# Patient Record
Sex: Male | Born: 1935
Health system: Southern US, Community
[De-identification: ages and names within clinical notes are randomized; demographics above are authoritative.]

## PROBLEM LIST (undated history)

## (undated) DIAGNOSIS — E785 Hyperlipidemia, unspecified: Secondary | ICD-10-CM

## (undated) DIAGNOSIS — R0600 Dyspnea, unspecified: Secondary | ICD-10-CM

## (undated) DIAGNOSIS — M8580 Other specified disorders of bone density and structure, unspecified site: Secondary | ICD-10-CM

## (undated) DIAGNOSIS — Z7901 Long term (current) use of anticoagulants: Secondary | ICD-10-CM

## (undated) DIAGNOSIS — K2270 Barrett's esophagus without dysplasia: Secondary | ICD-10-CM

## (undated) DIAGNOSIS — I351 Nonrheumatic aortic (valve) insufficiency: Secondary | ICD-10-CM

## (undated) DIAGNOSIS — G4730 Sleep apnea, unspecified: Secondary | ICD-10-CM

## (undated) DIAGNOSIS — K635 Polyp of colon: Secondary | ICD-10-CM

## (undated) DIAGNOSIS — S060X9A Concussion with loss of consciousness of unspecified duration, initial encounter: Secondary | ICD-10-CM

## (undated) DIAGNOSIS — R319 Hematuria, unspecified: Secondary | ICD-10-CM

## (undated) DIAGNOSIS — K449 Diaphragmatic hernia without obstruction or gangrene: Secondary | ICD-10-CM

## (undated) DIAGNOSIS — R42 Dizziness and giddiness: Secondary | ICD-10-CM

## (undated) DIAGNOSIS — R001 Bradycardia, unspecified: Secondary | ICD-10-CM

## (undated) DIAGNOSIS — I10 Essential (primary) hypertension: Secondary | ICD-10-CM

## (undated) DIAGNOSIS — S060XAA Concussion with loss of consciousness status unknown, initial encounter: Secondary | ICD-10-CM

## (undated) DIAGNOSIS — I4891 Unspecified atrial fibrillation: Secondary | ICD-10-CM

## (undated) DIAGNOSIS — K219 Gastro-esophageal reflux disease without esophagitis: Secondary | ICD-10-CM

## (undated) HISTORY — PX: OTHER SURGICAL HISTORY: SHX169

## (undated) HISTORY — DX: Sleep apnea, unspecified: G47.30

## (undated) HISTORY — PX: BLADDER SURGERY: SHX569

## (undated) HISTORY — DX: Unspecified atrial fibrillation: I48.91

## (undated) HISTORY — DX: Diaphragmatic hernia without obstruction or gangrene: K44.9

## (undated) HISTORY — PX: PROSTATE SURGERY: SHX751

## (undated) HISTORY — PX: CARPAL TUNNEL RELEASE: SHX101

## (undated) HISTORY — DX: Hyperlipidemia, unspecified: E78.5

## (undated) HISTORY — DX: Barrett's esophagus without dysplasia: K22.70

## (undated) HISTORY — DX: Nonrheumatic aortic (valve) insufficiency: I35.1

## (undated) HISTORY — PX: SHOULDER ARTHROSCOPY: SHX128

## (undated) HISTORY — PX: TONSILLECTOMY: SUR1361

## (undated) HISTORY — DX: Polyp of colon: K63.5

## (undated) HISTORY — DX: Essential (primary) hypertension: I10

## (undated) HISTORY — DX: Gastro-esophageal reflux disease without esophagitis: K21.9

## (undated) HISTORY — PX: EYE SURGERY: SHX253

## (undated) HISTORY — DX: Bradycardia, unspecified: R00.1

## (undated) HISTORY — DX: Dyspnea, unspecified: R06.00

## (undated) HISTORY — DX: Other specified disorders of bone density and structure, unspecified site: M85.80

## (undated) HISTORY — DX: Long term (current) use of anticoagulants: Z79.01

## (undated) HISTORY — PX: COLON SURGERY: SHX602

## (undated) HISTORY — DX: Dizziness and giddiness: R42

---

## 1997-06-17 HISTORY — PX: EYE SURGERY: SHX253

## 1997-11-11 ENCOUNTER — Observation Stay (HOSPITAL_COMMUNITY): Admission: RE | Admit: 1997-11-11 | Discharge: 1997-11-12 | Payer: Self-pay | Admitting: Urology

## 1998-01-09 ENCOUNTER — Other Ambulatory Visit: Admission: RE | Admit: 1998-01-09 | Discharge: 1998-01-09 | Payer: Self-pay | Admitting: Urology

## 1998-01-12 ENCOUNTER — Observation Stay (HOSPITAL_COMMUNITY): Admission: RE | Admit: 1998-01-12 | Discharge: 1998-01-13 | Payer: Self-pay | Admitting: Urology

## 1999-12-09 ENCOUNTER — Inpatient Hospital Stay (HOSPITAL_COMMUNITY): Admission: RE | Admit: 1999-12-09 | Discharge: 1999-12-13 | Payer: Self-pay | Admitting: Urology

## 1999-12-10 ENCOUNTER — Encounter: Payer: Self-pay | Admitting: Urology

## 1999-12-12 ENCOUNTER — Encounter: Payer: Self-pay | Admitting: Urology

## 2001-09-22 ENCOUNTER — Encounter: Admission: RE | Admit: 2001-09-22 | Discharge: 2001-12-21 | Payer: Self-pay | Admitting: Internal Medicine

## 2002-03-06 ENCOUNTER — Emergency Department (HOSPITAL_COMMUNITY): Admission: EM | Admit: 2002-03-06 | Discharge: 2002-03-06 | Payer: Self-pay | Admitting: Emergency Medicine

## 2002-03-07 ENCOUNTER — Encounter: Payer: Self-pay | Admitting: Emergency Medicine

## 2002-06-17 HISTORY — PX: CARDIOVERSION: SHX1299

## 2002-08-11 ENCOUNTER — Encounter: Payer: Self-pay | Admitting: Urology

## 2002-08-11 ENCOUNTER — Ambulatory Visit (HOSPITAL_COMMUNITY): Admission: RE | Admit: 2002-08-11 | Discharge: 2002-08-11 | Payer: Self-pay | Admitting: Urology

## 2002-08-22 ENCOUNTER — Ambulatory Visit (HOSPITAL_COMMUNITY): Admission: RE | Admit: 2002-08-22 | Discharge: 2002-08-22 | Payer: Self-pay | Admitting: Urology

## 2002-08-22 ENCOUNTER — Encounter: Payer: Self-pay | Admitting: Urology

## 2002-09-15 ENCOUNTER — Encounter: Payer: Self-pay | Admitting: Urology

## 2002-09-20 ENCOUNTER — Encounter (INDEPENDENT_AMBULATORY_CARE_PROVIDER_SITE_OTHER): Payer: Self-pay | Admitting: Specialist

## 2002-09-20 ENCOUNTER — Inpatient Hospital Stay (HOSPITAL_COMMUNITY): Admission: RE | Admit: 2002-09-20 | Discharge: 2002-09-23 | Payer: Self-pay | Admitting: Urology

## 2003-03-31 HISTORY — PX: US ECHOCARDIOGRAPHY: HXRAD669

## 2003-05-16 ENCOUNTER — Ambulatory Visit (HOSPITAL_COMMUNITY): Admission: RE | Admit: 2003-05-16 | Discharge: 2003-05-16 | Payer: Self-pay | Admitting: Cardiology

## 2004-04-24 ENCOUNTER — Inpatient Hospital Stay (HOSPITAL_COMMUNITY): Admission: AD | Admit: 2004-04-24 | Discharge: 2004-04-30 | Payer: Self-pay | Admitting: Cardiology

## 2004-06-04 ENCOUNTER — Ambulatory Visit: Payer: Self-pay | Admitting: Gastroenterology

## 2004-06-17 DIAGNOSIS — K635 Polyp of colon: Secondary | ICD-10-CM

## 2004-06-17 HISTORY — DX: Polyp of colon: K63.5

## 2004-07-03 ENCOUNTER — Ambulatory Visit: Payer: Self-pay | Admitting: Gastroenterology

## 2004-07-31 ENCOUNTER — Encounter (INDEPENDENT_AMBULATORY_CARE_PROVIDER_SITE_OTHER): Payer: Self-pay | Admitting: *Deleted

## 2004-07-31 ENCOUNTER — Ambulatory Visit: Payer: Self-pay | Admitting: Gastroenterology

## 2004-08-01 ENCOUNTER — Encounter (INDEPENDENT_AMBULATORY_CARE_PROVIDER_SITE_OTHER): Payer: Self-pay | Admitting: Specialist

## 2004-08-01 ENCOUNTER — Ambulatory Visit (HOSPITAL_COMMUNITY): Admission: RE | Admit: 2004-08-01 | Discharge: 2004-08-01 | Payer: Self-pay | Admitting: Internal Medicine

## 2004-12-15 ENCOUNTER — Emergency Department (HOSPITAL_COMMUNITY): Admission: EM | Admit: 2004-12-15 | Discharge: 2004-12-15 | Payer: Self-pay | Admitting: Emergency Medicine

## 2006-12-23 ENCOUNTER — Ambulatory Visit (HOSPITAL_COMMUNITY): Admission: RE | Admit: 2006-12-23 | Discharge: 2006-12-23 | Payer: Self-pay | Admitting: Internal Medicine

## 2009-07-07 ENCOUNTER — Encounter (INDEPENDENT_AMBULATORY_CARE_PROVIDER_SITE_OTHER): Payer: Self-pay | Admitting: *Deleted

## 2009-07-19 ENCOUNTER — Ambulatory Visit: Payer: Self-pay | Admitting: Gastroenterology

## 2009-07-19 ENCOUNTER — Encounter (INDEPENDENT_AMBULATORY_CARE_PROVIDER_SITE_OTHER): Payer: Self-pay | Admitting: *Deleted

## 2009-08-10 ENCOUNTER — Ambulatory Visit: Payer: Self-pay | Admitting: Gastroenterology

## 2009-08-10 ENCOUNTER — Encounter (INDEPENDENT_AMBULATORY_CARE_PROVIDER_SITE_OTHER): Payer: Self-pay | Admitting: *Deleted

## 2009-08-10 DIAGNOSIS — E119 Type 2 diabetes mellitus without complications: Secondary | ICD-10-CM | POA: Insufficient documentation

## 2009-08-10 DIAGNOSIS — Z860100 Personal history of colon polyps, unspecified: Secondary | ICD-10-CM | POA: Insufficient documentation

## 2009-08-10 DIAGNOSIS — I10 Essential (primary) hypertension: Secondary | ICD-10-CM | POA: Insufficient documentation

## 2009-08-10 DIAGNOSIS — D139 Benign neoplasm of ill-defined sites within the digestive system: Secondary | ICD-10-CM | POA: Insufficient documentation

## 2009-08-11 ENCOUNTER — Telehealth: Payer: Self-pay | Admitting: Gastroenterology

## 2009-08-21 ENCOUNTER — Telehealth (INDEPENDENT_AMBULATORY_CARE_PROVIDER_SITE_OTHER): Payer: Self-pay | Admitting: *Deleted

## 2009-08-23 ENCOUNTER — Ambulatory Visit: Payer: Self-pay | Admitting: Gastroenterology

## 2009-08-23 DIAGNOSIS — K2970 Gastritis, unspecified, without bleeding: Secondary | ICD-10-CM | POA: Insufficient documentation

## 2009-08-23 DIAGNOSIS — K2990 Gastroduodenitis, unspecified, without bleeding: Secondary | ICD-10-CM

## 2009-08-23 DIAGNOSIS — L293 Anogenital pruritus, unspecified: Secondary | ICD-10-CM | POA: Insufficient documentation

## 2009-08-23 HISTORY — PX: COLONOSCOPY: SHX174

## 2009-08-24 LAB — CONVERTED CEMR LAB: UREASE: NEGATIVE

## 2009-08-25 ENCOUNTER — Encounter: Payer: Self-pay | Admitting: Gastroenterology

## 2009-09-12 ENCOUNTER — Ambulatory Visit: Payer: Self-pay | Admitting: Gastroenterology

## 2009-09-12 DIAGNOSIS — K2270 Barrett's esophagus without dysplasia: Secondary | ICD-10-CM | POA: Insufficient documentation

## 2009-09-12 DIAGNOSIS — K219 Gastro-esophageal reflux disease without esophagitis: Secondary | ICD-10-CM | POA: Insufficient documentation

## 2010-04-09 ENCOUNTER — Ambulatory Visit: Payer: Self-pay | Admitting: Cardiology

## 2010-04-13 ENCOUNTER — Ambulatory Visit: Admission: RE | Admit: 2010-04-13 | Discharge: 2010-04-13 | Payer: Self-pay | Admitting: Cardiology

## 2010-04-18 ENCOUNTER — Telehealth (INDEPENDENT_AMBULATORY_CARE_PROVIDER_SITE_OTHER): Payer: Self-pay

## 2010-04-19 ENCOUNTER — Ambulatory Visit (HOSPITAL_COMMUNITY): Admission: RE | Admit: 2010-04-19 | Discharge: 2010-04-19 | Payer: Self-pay | Admitting: Cardiology

## 2010-04-19 ENCOUNTER — Encounter: Payer: Self-pay | Admitting: *Deleted

## 2010-04-19 ENCOUNTER — Ambulatory Visit: Payer: Self-pay | Admitting: Cardiology

## 2010-04-19 ENCOUNTER — Ambulatory Visit: Payer: Self-pay

## 2010-04-19 ENCOUNTER — Encounter (HOSPITAL_COMMUNITY)
Admission: RE | Admit: 2010-04-19 | Discharge: 2010-06-16 | Payer: Self-pay | Source: Home / Self Care | Attending: Cardiology | Admitting: Cardiology

## 2010-04-19 ENCOUNTER — Encounter: Payer: Self-pay | Admitting: Cardiology

## 2010-04-19 HISTORY — PX: CARDIOVASCULAR STRESS TEST: SHX262

## 2010-04-19 HISTORY — PX: TRANSTHORACIC ECHOCARDIOGRAM: SHX275

## 2010-04-30 ENCOUNTER — Ambulatory Visit: Payer: Self-pay | Admitting: Cardiology

## 2010-07-04 ENCOUNTER — Ambulatory Visit: Payer: Self-pay | Admitting: Cardiology

## 2010-07-17 NOTE — Progress Notes (Signed)
Summary: Nuc. Pre-Procedure  Phone Note Outgoing Call Call back at [facility] Phone [phone]   Call placed by: Irean Hong, RN,  April 18, 2010 1:37 PM Summary of Call: Left message with information on My[employer] Information Sheet (see scanned document for details).      Nuclear Med Background Indications for Stress Test: Evaluation for Ischemia   History: Cardioversion, Echo  History Comments: '04 Echo: NL LVF, mild LVH. '04:Cardioversion (AFIB).  Symptoms: Dizziness, DOE    Nuclear Pre-Procedure Cardiac Risk Factors: Family History - CAD, Hypertension, Lipids, NIDDM Height (in): 70

## 2010-07-17 NOTE — Miscellaneous (Signed)
Summary: rx for diflucan  Clinical Lists Changes  Medications: Added new medication of DIFLUCAN 100 MG  TABS (FLUCONAZOLE) twice by mouth on day one , then daily for two weeks - Signed Rx of DIFLUCAN 100 MG  TABS (FLUCONAZOLE) twice by mouth on day one , then daily for two weeks;  #15 x 0;  Signed;  Entered by: Oda Cogan;  Authorized by: Mardella Layman MD [facility];  Method used: Print then Give to Patient    Prescriptions: DIFLUCAN 100 MG  TABS (FLUCONAZOLE) twice by mouth on day one , then daily for two weeks  #15 x 0   Entered by:   Oda Cogan   Authorized by:   Mardella Layman MD [facility]   Signed by:   Oda Cogan on 08/23/2009   Method used:   Print then Give to Patient   RxID:   [id number]

## 2010-07-17 NOTE — Assessment & Plan Note (Signed)
Summary: REC COL/OV REQUESTED PER GI LETTER...AS.   History of Present Illness Visit Type: Initial Visit Primary GI MD: Sheryn Bison MD FACP FAGA Primary Provider: Jarome Matin, MD Chief Complaint: Patient here to discuss colonoscopy, he recieved a letter. He was having some constipation but started taking a stool softner and is having no problmes with his stools now. Patient currently denies any GI complaints.  History of Present Illness:   Very Nice but very complex 77 year old white male with chronic atrial fibrillation requiring, chronic Coumadin administration. He has adult onset diabetes managed with Actos 30 mg a day. He has a long history of recurrent colon polyps going back 20 years with multiple colonoscopies by Dr. Terrial Rhodes. He is due for followup exam at this time and denies lower GI complaints or rectal bleeding. 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 Fundoplication was also performed at that time. I cannot see where he has had followup endoscopy since that time. Review of his records also shows no presence of a pathologic report concerning this lesion. In any case, the patient denies upper gastrointestinal symptoms or dysphagia at this time. He also denies neck of a biller complaints.  He sees Dr. Peter Swaziland in cardiology for his atrial failed, and apparently is in normal sinus rhythm at this time all multiple different medications listed and reviewed his chart. For some reason unclear to me he is on daily Ritalin 10 mg. He does not take aspirin or other anticoagulants besides Coumadin. He does smoke several cigars a day and uses ethanol regularly. He denies a history of hepatitis or pancreatitis. He has a long history recurrent urologic problems and is managed by Dr. Brayton Layman and has a vague history of possible prostate and bladder cancer. His family history  is remarkable for colon cancer in his grandmother at an elderly age. Apparently there is no history of polyps or colon cancer in his children or siblings.He currently is on a regular denied and denies anorexia, weight loss, or hepatobiliary complaints.  Surgeries include implantation of defibrillator. Patient also has a history of sleep apnea.   GI Review of Systems      Denies abdominal pain, acid reflux, belching, bloating, chest pain, dysphagia with liquids, dysphagia with solids, heartburn, loss of appetite, nausea, vomiting, vomiting blood, weight loss, and  weight gain.        Denies anal fissure, black tarry stools, change in bowel habit, constipation, diarrhea, diverticulosis, fecal incontinence, heme positive stool, hemorrhoids, irritable bowel syndrome, jaundice, light color stool, liver problems, rectal bleeding, and  rectal pain. Preventive Screening-Counseling & Management  Alcohol-Tobacco     Smoking Status: current      Drug Use:  no.      Current Medications (verified): 1)  Mirtazapine 30 Mg Tabs (Mirtazapine) .... Take One By Mouth Once Daily 2)  Benicar 40 Mg Tabs (Olmesartan Medoxomil) .... Take One By Mouth Once Daily 3)  Fenofibrate Micronized 200 Mg Caps (Fenofibrate Micronized) .... Take One By Mouth Once Daily 4)  Furosemide 20 Mg Tabs (Furosemide) .... Take One By Mouth Once Daily 5)  Coumadin 3 Mg Tabs (Warfarin Sodium) .... Take One By Mouth Once Daily 6)  Klor-Con 20 Meq Pack (Potassium Chloride) .... Take One By Mouth Once Daily 7)  Ritalin 10 Mg Tabs (Methylphenidate Hcl) .... Take One By Mouth Once Daily 8)  Cardizem Cd 360 Mg Xr24h-Cap (Diltiazem  Hcl Coated Beads) .... Take One By Mouth Once Daily 9)  Calcium 600 Mg Tabs (Calcium) .... Take One By Mouth Once Daily 10)  Vitamin D 1000 Unit Caps (Cholecalciferol) .... Take One By Mouth Once Daily 11)  Cinnamon 500 Mg Caps (Cinnamon) .... Take One By Mouth Once Daily 12)  Tamsulosin Hcl 0.4 Mg Caps  (Tamsulosin Hcl) .... Take One By Mouth Once Daily 13)  Alendronate Sodium 70 Mg Tabs (Alendronate Sodium) .... Take One By Mouth Once A Week 14)  Simvastatin 20 Mg Tabs (Simvastatin) .... Take One By Mouth Once Daily 15)  Pacerone 100 Mg Tabs (Amiodarone Hcl) .... Take One By Mouth Once Daily 16)  Actos 30 Mg Tabs (Pioglitazone Hcl) .... Take One By Mouth Once Daily 17)  Amitriptyline Hcl 25 Mg Tabs (Amitriptyline Hcl) .... Take One By Mouth Once Daily  Allergies (verified): No Known Drug Allergies  Past History:  Past medical, surgical, family and social histories (including risk factors) reviewed for relevance to current acute and chronic problems.  Past Medical History: Arrhythmia Depression Diabetes Hyperlipidemia Hypertension Sleep Apnea GERD Colon Polyps  Past Surgical History: Fundoplication Gastric Mass Rotator Cuff repair Eye removal with implant Carpal Tunnel Release prostate surgery Bladder surgery Implantable device  Family History: Reviewed history from 08/09/2009 and no changes required. Family History of Colon Cancer: Paternal Grandmother  Family History of Esophageal Cancer: Paternal uncle  Social History: Reviewed history and no changes required. Occupation: retired Patient currently smokes. 3-4 cigars Alcohol Use - yes 2 per day Illicit Drug Use - no Smoking Status:  current Drug Use:  no  Review of Systems       The patient complains of hearing problems and urination - excessive.  The patient denies allergy/sinus, anemia, anxiety-new, arthritis/joint pain, back pain, blood in urine, breast changes/lumps, change in vision, confusion, cough, coughing up blood, depression-new, fainting, fatigue, fever, headaches-new, heart murmur, heart rhythm changes, itching, menstrual pain, muscle pains/cramps, night sweats, nosebleeds, pregnancy symptoms, shortness of breath, skin rash, sleeping problems, sore throat, swelling of feet/legs, swollen lymph glands,  thirst - excessive , urination - excessive , urination changes/pain, urine leakage, vision changes, and voice change.   General:  Denies fever, chills, sweats, anorexia, fatigue, weakness, malaise, weight loss, and sleep disorder. Eyes:  Complains of blurring and vision loss; denies diplopia, irritation, discharge, scotoma, eye pain, and photophobia; He has a prosthesis in his left eye and previous trauma. ENT:  Complains of decreased hearing; denies earache, ear discharge, tinnitus, nasal congestion, loss of smell, nosebleeds, sore throat, hoarseness, and difficulty swallowing. CV:  Denies chest pains, angina, palpitations, syncope, dyspnea on exertion, orthopnea, PND, peripheral edema, and claudication; he does have a history of essential hypertension. He also has a defibrillator in place.Marland Kitchen Resp:  Denies dyspnea at rest, dyspnea with exercise, cough, sputum, wheezing, coughing up blood, and pleurisy. GI:  Denies difficulty swallowing, pain on swallowing, nausea, indigestion/heartburn, vomiting, vomiting blood, abdominal pain, jaundice, gas/bloating, diarrhea, constipation, change in bowel habits, bloody BM's, black BMs, and fecal incontinence. GU:  Complains of urinary frequency and nocturnal urination; denies urinary burning, blood in urine, urinary hesitancy, urinary incontinence, penile discharge, genital sores, decreased libido, and erectile dysfunction; history of BPH and possible bladder cancer.. MS:  Denies joint pain / LOM, joint swelling, joint stiffness, joint deformity, low back pain, muscle weakness, muscle cramps, muscle atrophy, leg pain at night, leg pain with exertion, and shoulder pain / LOM hand / wrist pain (CTS). Derm:  Denies rash, itching,  dry skin, hives, moles, warts, and unhealing ulcers. Neuro:  Denies weakness, paralysis, abnormal sensation, seizures, syncope, tremors, vertigo, transient blindness, frequent falls, frequent headaches, difficulty walking, headache, sciatica,  radiculopathy other:, restless legs, memory loss, and confusion. Psych:  Denies depression, anxiety, memory loss, suicidal ideation, hallucinations, paranoia, phobia, and confusion. Endo:  Denies cold intolerance, heat intolerance, polydipsia, polyphagia, polyuria, unusual weight change, and hirsutism. Heme:  Complains of bruising; denies bleeding, enlarged lymph nodes, and pagophagia. Allergy:  Denies hives, rash, sneezing, hay fever, and recurrent infections.  Vital Signs:  Patient profile:   77 year old male Height:      70 inches Weight:      202.4 pounds BMI:     29.15 Pulse rate:   60 / minute Pulse rhythm:   regular BP sitting:   130 / 54  (left arm) Cuff size:   regular  Vitals Entered By: Harlow Mares CMA [name]) (August 10, 2009 9:44 AM)  Physical Exam  General:  Well developed, well nourished, no acute distress.healthy appearing.   Head:  Normocephalic and atraumatic. Eyes:  PERRLA, no icterus.Prosthesis in right eye Neck:  Supple; no masses or thyromegaly. Lungs:  Clear throughout to auscultation. Heart:  Regular rate and rhythm; no murmurs, rubs,  or bruits.He appears to be in regular rhythm at this time without an S3 gallop Abdomen:  Soft, nontender and nondistended. No masses, hepatosplenomegaly or hernias noted. Normal bowel sounds. Rectal:  deferred until time of colonoscopy.   Msk:  Symmetrical with no gross deformities. Normal posture. Pulses:  Normal pulses noted. Extremities:  No clubbing, cyanosis, edema or deformities noted. Neurologic:  Alert and  oriented x4;  grossly normal neurologically. Cervical Nodes:  No significant cervical adenopathy. Inguinal Nodes:  No significant inguinal adenopathy. Psych:  Alert and cooperative. Normal mood and affect.   Impression & Recommendations:  Problem # 1:  PERSONAL HX COLONIC POLYPS (ICD-V12.72) Assessment Unchanged  Will schedule outpatient colonoscopy with balanced electrolyte preparation. I see no  contraindication to proceeding as planned per his history of multiple recurrent adenomatous. His family history is really unrelated.  Orders: Colon/Endo (Colon/Endo)  Problem # 2:  COUMADIN THERAPY (ICD-V58.61) Assessment: Unchanged Hold Coumadin 5 days before colonoscopy procedure.  Problem # 3:  DM (ICD-250.00) Assessment: Unchanged Adjustments in diabetic medications for procedures.  Problem # 4:  BENIGN NEOPLASM OTH&UNSPEC SITE DIGESTIVE SYSTEM (ICD-211.9) Assessment: Unchanged He definitely needs followup endoscopy per his probable GIST tumor removed many years ago. Also at that time he had fundoplication and has no current reflux symptoms.  Problem # 5:  HYPERTENSION (ICD-401.9) Assessment: Improved Blood Pressure is 130/54 and pulse is 60 and regular. He is to continue all of his other cardiac medications as listed and reviewed his chart. Patient also has a history of hyperlipidemia and is on simvastatin therapy.  Patient Instructions: 1)  Copy sent to : Dr. Peter Swaziland and Dr. Jarome Matin 2)  Please continue current medications.  3)  Colonoscopy and Flexible Sigmoidoscopy brochure given.  4)  Conscious Sedation brochure given.  5)  Upper Endoscopy brochure given.  6)  Hold Coumadin 5 days before the endoscopic procedures 7)  Balanced electrolyte preparation with adjustment in diabetic medications 8)  The medication list was reviewed and reconciled.  All changed / newly prescribed medications were explained.  A complete medication list was provided to the patient / caregiver. Prescriptions: MOVIPREP 100 GM  SOLR (PEG-KCL-NACL-NASULF-NA ASC-C) As per prep instructions.  #1 x 0   Entered by:  Ashok Cordia RN   Authorized by:   Mardella Layman MD [facility]   Signed by:   Ashok Cordia RN on 08/10/2009   Method used:   Print then Give to Patient   RxID:   [id number]

## 2010-07-17 NOTE — Miscellaneous (Signed)
Summary: Clotest  Clinical Lists Changes  Orders: Added new Test order of TLB-H Pylori Screen Gastric Biopsy (83013-CLOTEST) - Signed 

## 2010-07-17 NOTE — Procedures (Signed)
Summary: Colonoscopy  Patient: Anthony Ho Note: All result statuses are Final unless otherwise noted.  Tests: (1) Colonoscopy (COL)   COL Colonoscopy           DONE     [facility]     [address]     [place], [place]  [address]           COLONOSCOPY PROCEDURE REPORT           PATIENT:  [name], [name]  MR#:  [id number]     BIRTHDATE:  19-Aug-1931, 77 yrs. old  GENDER:  male           ENDOSCOPIST:  Vania Rea. Jarold Motto, MD, [facility]     Referred by:           PROCEDURE DATE:  08/23/2009     PROCEDURE:  Average-risk screening colonoscopy     [id number]     ASA CLASS:  Class III     INDICATIONS:  history of hyperplastic polyps PATIENT OF DR, Doreatha Martin     [place]           MEDICATIONS:   Fentanyl 50 mcg IV, Versed 8 mg IV           DESCRIPTION OF PROCEDURE:   After the risks benefits and     alternatives of the procedure were thoroughly explained, informed     consent was obtained.  Digital rectal exam was performed and     revealed no abnormalities.   The LB [id number] endoscope     was introduced through the anus and advanced to the cecum, which     was identified by both the appendix and ileocecal valve, without     limitations.  The quality of the prep was excellent, using     MoviPrep.  The instrument was then slowly withdrawn as the colon     was fully examined.     <<PROCEDUREIMAGES>>           FINDINGS:  No polyps or cancers were seen.  This was otherwise a     normal examination of the colon.   Retroflexed views in the rectum     revealed no abnormalities.    The scope was then withdrawn from     the patient and the procedure completed.           COMPLICATIONS:  None           ENDOSCOPIC IMPRESSION:     1) No polyps or cancers     2) Otherwise normal examination     RECOMMENDATIONS:     1) Return to the care of your primary provider. GI follow up as     needed     RESUME ALL MEDS           REPEAT EXAM:  No           ______________________________     Vania Rea. Jarold Motto, MD, Clementeen Graham           CC:  Jarome Matin, MD           n.     Rosalie Doctor:   Vania Rea. Tatayana Beshears at 08/23/2009 09:44 AM           Jamesetta Orleans, [id number]  Note: An exclamation mark (!) indicates a result that was not dispersed into the flowsheet. Document Creation Date: 08/23/2009 9:44 AM _______________________________________________________________________  (1) Order result status: Final Collection or observation date-time: 08/23/2009 09:40 Requested date-time:  Receipt date-time:  Reported date-time:  Referring Physician:   Ordering Physician: Sheryn Bison [id number]) Specimen Source:  Source: Launa Grill Order Number: [id number] Lab site:

## 2010-07-17 NOTE — Letter (Signed)
Summary: New Patient letter  [facility]  [address] [place], [place] [address]   Phone: [phone]  Fax: [phone]       07/19/2009 MRN: [id number]  Anthony Ho [address] [place], [place]  [address]  Dear Anthony Ho,  Welcome to the Gastroenterology Division at [facility].    You are scheduled to see Dr. Jarold Motto on 08/10/2009 at 9:30AM on the 3rd floor at [facility], [address]  We ask that you try to arrive at our office 15 minutes prior to your appointment time to allow for check-in.  We would like you to complete the enclosed self-administered evaluation form prior to your visit and bring it with you on the day of your appointment.  We will review it with you.  Also, please bring a complete list of all your medications or, if you prefer, bring the medication bottles and we will list them.  Please bring your insurance card so that we may make a copy of it.  If your insurance requires a referral to see a specialist, please bring your referral form from your primary care physician.  Co-payments are due at the time of your visit and may be paid by cash, check or credit card.     Your office visit will consist of a consult with your physician (includes a physical exam), any laboratory testing he/she may order, scheduling of any necessary diagnostic testing (e.g. x-ray, ultrasound, CT-scan), and scheduling of a procedure (e.g. Endoscopy, Colonoscopy) if required.  Please allow enough time on your schedule to allow for any/all of these possibilities.    If you cannot keep your appointment, please call [phone] to cancel or reschedule prior to your appointment date.  This allows [place] the opportunity to schedule an appointment for another patient in need of care.  If you do not cancel or reschedule by 5 p.m. the business day prior to your appointment date, you will be charged a $50.00 late cancellation/no-show fee.    Thank you for choosing  [facility] for your medical needs.  We appreciate the opportunity to care for you.  Please visit [place] at our website  to learn more about our practice.                     Sincerely,                                                             The Gastroenterology Division

## 2010-07-17 NOTE — Assessment & Plan Note (Signed)
Summary: Post proc. 2-3 weeks/dfs   History of Present Illness Visit Type: Follow-up Visit Primary GI MD: Sheryn Bison MD FACP FAGA Primary Provider: Jarome Matin, MD Chief Complaint: Patient states that he is doing well after the procedure he finished the Diflucan.  History of Present Illness:   Anthony Ho is asymptomatic after his endoscopy. He had Candida esophagitis that was treated with 2 weeks of Diflucan. Altered esophagitis was biopsied and showed evidence of Barrett's mucosa. He continues with acid reflux mostly at night. He denies dysphasia or chest pain. He did have a past history of a GIST tumor removed years ago at the time of fundoplication. There is no evidence of recurrent lesions on endoscopy and biopsies for H. pylori were negative. Colonoscopy was unremarkable without evidence of adenomatous polyps.  His wife and I had a long discussion about acid reflux and the patient's use of 2 large cocktails each night. He does not to me seem to have an alcohol problem. He is diabetic and is on Coumadin and multiple cardiac medications, and I have counseled the patient as to moderation in terms of his alcohol intake. Also explained was the association of alcohol and GERD. His diabetes is managed by Dr. Jarome Matin. He denies current cardiovascular or endocrine problems.   GI Review of Systems      Denies abdominal pain, acid reflux, belching, bloating, chest pain, dysphagia with liquids, dysphagia with solids, heartburn, loss of appetite, nausea, vomiting, vomiting blood, weight loss, and  weight gain.        Denies anal fissure, black tarry stools, change in bowel habit, constipation, diarrhea, diverticulosis, fecal incontinence, heme positive stool, hemorrhoids, irritable bowel syndrome, jaundice, light color stool, liver problems, rectal bleeding, and  rectal pain.    Current Medications (verified): 1)  Mirtazapine 30 Mg Tabs (Mirtazapine) .... Take One By Mouth Once Daily 2)   Benicar 40 Mg Tabs (Olmesartan Medoxomil) .... Take One By Mouth Once Daily 3)  Fenofibrate Micronized 200 Mg Caps (Fenofibrate Micronized) .... Take One By Mouth Once Daily 4)  Furosemide 20 Mg Tabs (Furosemide) .... Take One By Mouth Once Daily 5)  Coumadin 3 Mg Tabs (Warfarin Sodium) .... Take One By Mouth Once Daily 6)  Klor-Con 20 Meq Pack (Potassium Chloride) .... Take One By Mouth Once Daily 7)  Ritalin 10 Mg Tabs (Methylphenidate Hcl) .... Take One By Mouth Once Daily 8)  Cardizem Cd 360 Mg Xr24h-Cap (Diltiazem Hcl Coated Beads) .... Take One By Mouth Once Daily 9)  Calcium 600 Mg Tabs (Calcium) .... Take One By Mouth Once Daily 10)  Vitamin D 1000 Unit Caps (Cholecalciferol) .... Take One By Mouth Once Daily 11)  Cinnamon 500 Mg Caps (Cinnamon) .... Take One By Mouth Once Daily 12)  Tamsulosin Hcl 0.4 Mg Caps (Tamsulosin Hcl) .... Take One By Mouth Once Daily 13)  Alendronate Sodium 70 Mg Tabs (Alendronate Sodium) .... Take One By Mouth Once A Week 14)  Simvastatin 20 Mg Tabs (Simvastatin) .... Take One By Mouth Once Daily 15)  Pacerone 100 Mg Tabs (Amiodarone Hcl) .... Take One By Mouth Once Daily 16)  Actos 30 Mg Tabs (Pioglitazone Hcl) .... Take One By Mouth Once Daily 17)  Amitriptyline Hcl 25 Mg Tabs (Amitriptyline Hcl) .... Take One By Mouth Once Daily  Allergies (verified): No Known Drug Allergies  Past History:  Past medical, surgical, family and social histories (including risk factors) reviewed for relevance to current acute and chronic problems.  Past Medical History: Reviewed history  from 08/10/2009 and no changes required. Arrhythmia Depression Diabetes Hyperlipidemia Hypertension Sleep Apnea GERD Colon Polyps  Past Surgical History: Reviewed history from 08/10/2009 and no changes required. Fundoplication Gastric Mass Rotator Cuff repair Eye removal with implant Carpal Tunnel Release prostate surgery Bladder surgery Implantable device  Family  History: Reviewed history from 08/10/2009 and no changes required. Family History of Colon Cancer: Paternal Grandmother  Family History of Esophageal Cancer: Paternal uncle  Social History: Reviewed history from 08/10/2009 and no changes required. Occupation: retired Patient currently smokes. 3-4 cigars Alcohol Use - yes 2 per day Illicit Drug Use - no  Review of Systems       The patient complains of hearing problems and urination - excessive.  The patient denies allergy/sinus, anemia, anxiety-new, arthritis/joint pain, back pain, blood in urine, breast changes/lumps, change in vision, confusion, cough, coughing up blood, depression-new, fainting, fatigue, fever, headaches-new, heart murmur, heart rhythm changes, itching, menstrual pain, muscle pains/cramps, night sweats, nosebleeds, pregnancy symptoms, shortness of breath, skin rash, sleeping problems, sore throat, swelling of feet/legs, swollen lymph glands, thirst - excessive , urination - excessive , urination changes/pain, urine leakage, vision changes, and voice change.    Vital Signs:  Patient profile:   77 year old male Height:      70 inches Weight:      200.0 pounds BMI:     28.80 Pulse rate:   60 / minute Pulse rhythm:   regular BP sitting:   118 / 58  (left arm) Cuff size:   regular  Vitals Entered By: Harlow Mares CMA [name]) (September 12, 2009 10:33 AM)  Physical Exam  General:  Well developed, well nourished, no acute distress.healthy appearing.   Head:  Normocephalic and atraumatic. Eyes:  PERRLA, no icterus.exam deferred to patient's ophthalmologist.   Psych:  Alert and cooperative. Normal mood and affect.   Impression & Recommendations:  Problem # 1:  GERD (ICD-530.81) Assessment Deteriorated Resume PPI therapy with omeprazole 40 mg 30 minutes before supper since most of his symptoms are nocturnal. Anti-reflex regime also review with patient and his wife.  Problem # 2:  BARRETT'S ESOPHAGUS  (ICD-530.85) Assessment: Unchanged No evidence of dysplasia on biopsies. He will need every 2-3 year endoscopy and dysplasia screening.  Problem # 3:  GASTRITIS (ICD-535.50) Assessment: Improved Biopsies for H. pylori were negative.  Problem # 4:  DM (ICD-250.00) Assessment: Improved continue multiple medications per Dr. Eloise Harman would continue medical primary care followup as needed  Patient Instructions: 1)  Begin omeprazole 40mg .. 30 minutes before supper. 2)  Barrett's Esophagus brochure given.  3)  You will need to recheck your esophagus in 3 years. 4)  The medication list was reviewed and reconciled.  All changed / newly prescribed medications were explained.  A complete medication list was provided to the patient / caregiver. 5)  Copy sent to : Dr. Jarome Matin 6)  Please continue current medications.  7)  Avoid foods high in acid content ( tomatoes, citrus juices, spicy foods) . Avoid eating within 3 to 4 hours of lying down or before exercising. Do not over eat; try smaller more frequent meals. Elevate head of bed four inches when sleeping.  Prescriptions: OMEPRAZOLE 40 MG  CPDR (OMEPRAZOLE) 1 each day 30 minutes before supper  #30 x 11   Entered by:   Ashok Cordia RN   Authorized by:   Mardella Layman MD [facility]   Signed by:   Ashok Cordia RN on 09/12/2009   Method used:  Print then Give to Patient   RxID:   [id number]

## 2010-07-17 NOTE — Letter (Signed)
Summary: Colonoscopy-Changed to Office Visit Letter  [facility]  [address] [place], [place] [address]   Phone: [phone]  Fax: [phone]      July 07, 2009 MRN: [id number]   DORN HARTSHORNE [address] [place], [place]  [address]   Dear Mr. Scales,   According to our records, it is time for you to schedule a Colonoscopy. However, after reviewing your medical record, I feel that an office visit would be most appropriate to more completely evaluate you and determine your need for a repeat procedure.  Please call [phone] (option #2) at your convenience to schedule an office visit. If you have any questions, concerns, or feel that this letter is in error, we would appreciate your call.   Sincerely,  Sheryn Bison, M.D.  [facility] [phone]

## 2010-07-17 NOTE — Miscellaneous (Signed)
Summary: Orders Update clo test  Clinical Lists Changes  Problems: Added new problem of GENITAL PRURITUS (ICD-698.1) Added new problem of GASTRITIS (ICD-535.50) Orders: Added new Test order of TLB-H Pylori Screen Gastric Biopsy (83013-CLOTEST) - Signed

## 2010-07-17 NOTE — Procedures (Signed)
Summary: Colon   Colonoscopy  Procedure date:  07/31/2004  Findings:      Location:  [facility].   Patient Name: Anthony, Ho MRN:  Procedure Procedures: Colonoscopy CPT: [phone].  Personnel: Endoscopist: Ulyess Mort, MD.  Exam Location: Exam performed in Outpatient Clinic. Outpatient  Patient Consent: Procedure, Alternatives, Risks and Benefits discussed, consent obtained, from patient. Consent was obtained by the RN.  Indications  Surveillance of: Adenomatous Polyp(s).  Increased Risk Screening: For family history of colorectal neoplasia, in   History  Current Medications: Patient is not currently taking Coumadin.  Pre-Exam Physical: Entire physical exam was normal.  Exam Exam: Extent of exam reached: Cecum, extent intended: Cecum.  The cecum was identified by appendiceal orifice and IC valve. Colon retroflexion performed. Images were not taken. ASA Classification: II. Tolerance: good.  Monitoring: Pulse and BP monitoring, Oximetry used. Supplemental O2 given.  Colon Prep Prep results: good.  Sedation Meds: Patient assessed and found to be appropriate for moderate (conscious) sedation. Fentanyl 100 mcg. given IV. Versed 10 mg. given IV.  Findings - DIVERTICULOSIS: Descending Colon to Sigmoid Colon. ICD9: Diverticulosis: 562.10. Comments: mild.  POLYP: Sigmoid Colon, Maximum size: 3 mm. sessile polyp. Procedure:  hot biopsy, removed, retrieved, Polyp sent to pathology. ICD9: Colon Polyps: 211.3.  - HEMORRHOIDS: Internal and External. Size: Grade I. ICD9: Hemorrhoids, Internal and  External: 455.6.   Assessment Abnormal examination, see findings above.  Diagnoses: 562.10: Diverticulosis.  211.3: Colon Polyps.  455.6: Hemorrhoids, Internal and  External.   Events  Unplanned Interventions: No intervention was required.  Unplanned Events: There were no complications. Plans Medication Plan: Await pathology. Continue current  medications.  Patient Education: Patient given standard instructions for: Polyps. Diverticulosis. Hemorrhoids. Yearly hemoccult testing recommended. Patient instructed to get routine colonoscopy every 5 years.  Disposition: After procedure patient sent to recovery. After recovery patient sent home.    cc; Sheryn Bison, MD

## 2010-07-17 NOTE — Procedures (Signed)
Summary: Upper Endoscopy  Patient: Jaecob Lowden Note: All result statuses are Final unless otherwise noted.  Tests: (1) Upper Endoscopy (EGD)   EGD Upper Endoscopy       DONE     [facility]     [address]     [place], [place]  [address]           ENDOSCOPY PROCEDURE REPORT           PATIENT:  Day, Deery  MR#:  [id number]     BIRTHDATE:  April 22, 1932, 77 yrs. old  GENDER:  male           ENDOSCOPIST:  Vania Rea. Jarold Motto, MD, [facility]     Referred by:           PROCEDURE DATE:  08/23/2009     PROCEDURE:  EGD with biopsy     ASA CLASS:  Class III     INDICATIONS:  HX. OF GIST TOMOR REMOVAL IN 1994.SURGICALLY.           MEDICATIONS:   Fentanyl 25 mcg IV, There was residual sedation     effect present from prior procedure., Versed 1 mg IV     TOPICAL ANESTHETIC:           DESCRIPTION OF PROCEDURE:   After the risks benefits and     alternatives of the procedure were thoroughly explained, informed     consent was obtained.  The LB GIF-H180 [id number] endoscope was     introduced through the mouth and advanced to the second portion of     the duodenum, without limitations.  The instrument was slowly     withdrawn as the mucosa was fully examined.     <<PROCEDUREIMAGES>>           Barrett's esophagus was found. MULTIPLE BIOPSIES DONE.  Multiple     erosions were found in the distal esophagus. LINEAR EROSIONS AND     EARLY ULCERATIONS NOTED AND BIOPSIED.  thrush. CANDIDA EXUDATE     THROUGHOUT ESOPHAGUS NOTED.  Mild gastritis was found in the body     and the antrum of the stomach. CLO BX. DONE.SMALL HH NOTED.     Normal duodenal folds were noted.    SMALL 2 CM./ HH.NO MASS     LESIONS OR LARGE SCARS OR BLEEDING NOTED.  The scope was then     withdrawn from the patient and the procedure completed.           COMPLICATIONS:  None           ENDOSCOPIC IMPRESSION:     1) Barrett's esophagus     2) Erosions, multiple in the distal esophagus     3) Thrush     4) Mild gastritis  in the body and the antrum of the stomach     5) Normal duodenal folds     1.SEVERE GERD     2.R/O BARRETT'S     3. CANDIDA ESOPHAGITIS     4. R/O H.PYLORI     5. NO GIST TUMOR RECURRENCE SEEN.     RECOMMENDATIONS:     1) Anti-reflux regimen to be follow     2) Await biopsy results     DIFLUCAN 100 MG BID FOR 1 DAY, THEN DAILY FOR 2 WEEKS.           REPEAT EXAM:  No           ______________________________     Onalee Hua  Hale Bogus, MD, [facility]           CC:  Jarome Matin, MD           n.     Rosalie Doctor:   Vania Rea. Kelan Pritt at 08/23/2009 10:04 AM           Jamesetta Orleans, [phone]  Note: An exclamation mark (!) indicates a result that was not dispersed into the flowsheet. Document Creation Date: 08/23/2009 10:04 AM _______________________________________________________________________  (1) Order result status: Final Collection or observation date-time: 08/23/2009 09:53 Requested date-time:  Receipt date-time:  Reported date-time:  Referring Physician:   Ordering Physician: Sheryn Bison [phone]) Specimen Source:  Source: Launa Grill Order Number: [id number] Lab site:

## 2010-07-17 NOTE — Letter (Signed)
Summary: [facility] Instructions  [facility]  [address] [place], [place] [address]   Phone: [phone]  Fax: [phone]       RASHAUN WICHERT    75-Dec-1933    MRN: [id number]        Procedure Day Dorna Bloom: Wednesday, 08/23/09     Arrival Time: 8:00      Procedure Time: 9:00     Location of Procedure:                    Juliann Pares  [facility] (4th Floor)  _ _  [facility] ( Short Stay on [facility])                       PREPARATION FOR COLONOSCOPY WITH MOVIPREP   Starting 5 days prior to your procedure 08/18/09 do not eat nuts, seeds, popcorn, corn, beans, peas,  salads, or any raw vegetables.  Do not take any fiber supplements (e.g. Metamucil, Citrucel, and Benefiber).  THE DAY BEFORE YOUR PROCEDURE         DATE: 08/22/09  DAY: Tuesday  1.  Drink clear liquids the entire day-NO SOLID FOOD  2.  Do not drink anything colored red or purple.  Avoid juices with pulp.  No orange juice.  3.  Drink at least 64 oz. (8 glasses) of fluid/clear liquids during the day to prevent dehydration and help the prep work efficiently.  CLEAR LIQUIDS INCLUDE: Water Jello Ice Popsicles Tea (sugar ok, no milk/cream) Powdered fruit flavored drinks Coffee (sugar ok, no milk/cream) Gatorade Juice: apple, white grape, white cranberry  Lemonade Clear bullion, consomm, broth Carbonated beverages (any kind) Strained chicken noodle soup Hard Candy                             4.  In the morning, mix first dose of MoviPrep solution:    Empty 1 Pouch A and 1 Pouch B into the disposable container    Add lukewarm drinking water to the top line of the container. Mix to dissolve    Refrigerate (mixed solution should be used within 24 hrs)  5.  Begin drinking the prep at 5:00 p.m. The MoviPrep container is divided by 4 marks.   Every 15 minutes drink the solution down to the next mark (approximately 8 oz) until the full liter is complete.   6.  Follow completed prep  with 16 oz of clear liquid of your choice (Nothing red or purple).  Continue to drink clear liquids until bedtime.  7.  Before going to bed, mix second dose of MoviPrep solution:    Empty 1 Pouch A and 1 Pouch B into the disposable container    Add lukewarm drinking water to the top line of the container. Mix to dissolve    Refrigerate  THE DAY OF YOUR PROCEDURE      DATE: 08/23/09  DAY: Wednesday  Beginning at 4:00 a.m. (5 hours before procedure):         1. Every 15 minutes, drink the solution down to the next mark (approx 8 oz) until the full liter is complete.  2. Follow completed prep with 16 oz. of clear liquid of your choice.    3. You may drink clear liquids until 7:00 (2 HOURS BEFORE PROCEDURE).   MEDICATION INSTRUCTIONS  Unless otherwise instructed, you should take regular prescription medications with a small sip  of water   as early as possible the morning of your procedure.  Diabetic patients - see separate instructions.  .     Stop taking Coumadin on  08/18/09  (5 days before procedure).           OTHER INSTRUCTIONS  You will need a responsible adult at least 75 years of age to accompany you and drive you home.   This person must remain in the waiting room during your procedure.  Wear loose fitting clothing that is easily removed.  Leave jewelry and other valuables at home.  However, you may wish to bring a book to read or  an iPod/MP3 player to listen to music as you wait for your procedure to start.  Remove all body piercing jewelry and leave at home.  Total time from sign-in until discharge is approximately 2-3 hours.  You should go home directly after your procedure and rest.  You can resume normal activities the  day after your procedure.  The day of your procedure you should not:   Drive   Make legal decisions   Operate machinery   Drink alcohol   Return to work  You will receive specific instructions about eating, activities and  medications before you leave.    The above instructions have been reviewed and explained to me by   _______________________    I fully understand and can verbalize these instructions _____________________________ Date _________

## 2010-07-17 NOTE — Letter (Signed)
Summary: Diabetic Instructions  [facility]  [address] [place], [place] [address]   Phone: [phone]  Fax: [phone]    JAMI OHLIN 1931-08-22 MRN: [id number]   _X  _   ORAL DIABETIC MEDICATION INSTRUCTIONS  The day before your procedure:   Take your diabetic pill as you do normally  The day of your procedure:   Do not take your diabetic pill    We will check your blood sugar levels during the admission process and again in Recovery before discharging you home  ________________________________________________________________________  _  _   INSULIN (LONG ACTING) MEDICATION INSTRUCTIONS (Lantus, NPH, 70/30, Humulin, Novolin-N)   The day before your procedure:   Take  your regular evening dose    The day of your procedure:   Do not take your morning dose    _  _   INSULIN (SHORT ACTING) MEDICATION INSTRUCTIONS (Regular, Humulog, Novolog)   The day before your procedure:   Do not take your evening dose   The day of your procedure:   Do not take your morning dose   _  _   INSULIN PUMP MEDICATION INSTRUCTIONS  We will contact the physician managing your diabetic care for written dosage instructions for the day before your procedure and the day of your procedure.  Once we have received the instructions, we will contact you.

## 2010-07-17 NOTE — Miscellaneous (Signed)
Summary: [facility] Previsit/prep  Clinical Lists Changes     Pt. given office vist with Dr. Jarold Motto per letter pt. received requesting such.

## 2010-07-17 NOTE — Progress Notes (Signed)
Summary: prep ?'s  Phone Note Call from Patient Call back at Home Phone [phone]   Caller: Patient Call For: Dr. Jarold Motto Reason for Call: Talk to Nurse Summary of Call: pt having ECL Wednesday and has questions regarding what he can and cannot eat Initial call taken by: Vallarie Mare,  August 21, 2009 11:40 AM  Follow-up for Phone Call        pt ate nuts and popcorn on Sat. told pt that he did not have to cancel procedure.  Reviewed diet for colon prep with pt. Follow-up by: Ezra Sites RN,  August 21, 2009 11:47 AM

## 2010-07-17 NOTE — Letter (Signed)
Summary: Patient [facility] Biopsy Results  [facility]  [address] [place], [place] [address]   Phone: [phone]  Fax: [phone]        August 25, 2009 MRN: [id number]    ADMIR CANDELAS [address] [place], [place]  [address]    Dear Mr. Grilliot,  I am pleased to inform you that the biopsies taken during your recent endoscopic examination did not show any evidence of cancer upon pathologic examination.  Additional information/recommendations:  __No further action is needed at this time.  Please follow-up with      your primary care physician for your other healthcare needs.  __ Please call [phone] to schedule a return visit to review      your condition.  _xx_ Continue with the treatment plan as outlined on the day of your      exam.  __ You should have a repeat endoscopic examination for this problem              in _ months/years.   Please call us if you are having persistent problems or have questions about your condition that have not been fully answered at this time.  Sincerely,  Mardella Layman MD [facility]  This letter has been electronically signed by your physician.  Appended Document: Patient Notice-Endo Biopsy Results Letter mailed 3.15.11

## 2010-07-17 NOTE — Progress Notes (Signed)
Summary: speak to nurse  Phone Note Call from Patient Call back at Home Phone [phone]   Caller: Patient Call For: Jarold Motto Reason for Call: Talk to Nurse Summary of Call: Patient states that he does not have the rx for his Movi prep wants it called in to his pharmacy [facility] pharmacy) Initial call taken by: Tawni Levy,  August 11, 2009 12:08 PM  Follow-up for Phone Call        LM for pt to call.   Lupita Leash Surface RN  August 11, 2009 12:56 PM  No answer.   Ashok Cordia RN  August 11, 2009 4:42 PM  Talked with pt.  States Rx is at pharmacy.  He was mistaken.   Follow-up by: Ashok Cordia RN,  August 14, 2009 9:03 AM

## 2010-07-17 NOTE — Assessment & Plan Note (Signed)
Summary: Cardiology Nuclear Testing  Nuclear Med Background Indications for Stress Test: Evaluation for Ischemia   History: Cardioversion, Echo  History Comments: h/o afib.  Symptoms: Chest Pain, Chest Pain with Exertion, Dizziness, DOE, Fatigue  Symptoms Comments: Last episode of CP:2 weeks.   Nuclear Pre-Procedure Cardiac Risk Factors: Family History - CAD, Hypertension, Lipids, NIDDM, Smoker Caffeine/Decaff Intake: None NPO After: 8:00 PM Lungs: Clear IV 0.9% NS with Angio Cath: 22g     IV Site: R Antecubital IV Started by: Irean Hong, RN Chest Size (in) 46     Height (in): 70 Weight (lb): 203 BMI: 29.23  Nuclear Med Study 1 or 2 day study:  1 day     Stress Test Type:  Treadmill/Lexiscan Reading MD:  Cassell Clement, MD     Referring MD:  Peter Swaziland, MD Resting Radionuclide:  Technetium 38m Tetrofosmin     Resting Radionuclide Dose:  10.4 mCi  Stress Radionuclide:  Technetium 20m Tetrofosmin     Stress Radionuclide Dose:  33 mCi   Stress Protocol Exercise Time (min):  7:30 min     Max HR:  96 bpm     Predicted Max HR:  144 bpm  Max Systolic BP: 205 mm Hg     Percent Max HR:  66.67 %     METS: 7.2 Rate Pressure Product:  16109  Lexiscan: 0.4 mg   Stress Test Technologist:  [employer], CMA-N     Nuclear Technologist:  Domenic Polite, CNMT  Rest Procedure  Myocardial perfusion imaging was performed at rest 45 minutes following the intravenous administration of Technetium 11m Tetrofosmin.  Stress Procedure  The patient attempted to walk the treadmill utilizing the Bruce protocol for 6:30, but was unable to reach his target heart rate.  He was then given IV Lexiscan 0.4 mg over 15-seconds while continuing to walk and then Technetium 61m Tetrofosmin was injected at 30-seconds.  There were nonspecific ST-T wave changes with Lexiscan.  He had a mild hypertensive response to exercise, 205/60.  Quantitative spect images were obtained after a 45 minute  delay.  QPS Raw Data Images:  Normal; no motion artifact; normal heart/lung ratio. Stress Images:  Normal homogeneous uptake in all areas of the myocardium. Rest Images:  Normal homogeneous uptake in all areas of the myocardium. Subtraction (SDS):  No evidence of ischemia. Transient Ischemic Dilatation:  1.22  (Normal <1.22)  Lung/Heart Ratio:  0.34  (Normal <0.45)  Quantitative Gated Spect Images QGS EDV:  151 ml QGS ESV:  42 ml QGS EF:  72 % QGS cine images:  Normal LV systolic function.  No wall motion abnormalities  Findings Normal nuclear study      Overall Impression  Exercise Capacity: Fair exercise capacity.  Inadequate heart rate response to exercise. BP Response: Normal blood pressure response. Clinical Symptoms: No chest pain ECG Impression: Insignificant upsloping ST segment depression. Overall Impression: Normal stress nuclear study. Overall Impression Comments: Protocol was switched to [facility] because of inadequate heart rate response.  Appended Document: Cardiology Nuclear Testing copy sent to Dr.Jordan

## 2010-09-07 LAB — GLUCOSE, CAPILLARY
Glucose-Capillary: 76 mg/dL (ref 70–99)
Glucose-Capillary: 82 mg/dL (ref 70–99)

## 2010-11-02 NOTE — H&P (Signed)
NAME:  Anthony Ho, Anthony Ho                            ACCOUNT NO.:  [id number]   MEDICAL RECORD NO.:  [id number]                   PATIENT TYPE:  INP   LOCATION:  0371                                 FACILITY:  [facility]   PHYSICIAN:  Sigmund I. Patsi Sears, M.D.         DATE OF BIRTH:  [birth year]   DATE OF ADMISSION:  09/20/2002  DATE OF DISCHARGE:                                HISTORY & PHYSICAL   SUBJECTIVE:  A 77 year old, white male,who has a history of a nephrogenic  adenoma resection in the past and was noted to have a bladder tumor in 1991  with transitional incision of BNC.  Biopsy showed cystitis glandularis, and  patient was noted to have a nephrogenic adenoma as well at that time.  He is  in the office today complaining of urgency and frequency and cystoscopy  shows a mass at the dome of his bladder.  The patient had a CT scan which  also showed a large mass, apparently arising from the prostate.  MRI has  been accomplished which confirms that the patient has a large mass in the  bottom of his bladder, near the central and transitional zones of the  prostate.  This measures 4 x 5 x 5 cm with a low T1 and low T2 signal, and  contrast enhancement of a nodule a portion of this mass, along with its  inferior aspect, contiguous with the prostate.  This is most consistent with  a degenerating adenoma from the benign prostate enlargement.   PAST MEDICAL HISTORY:  1. Diabetes.  2. Hypertension.  3. Barrett's esophagitis.  4. Hepatic hemangioma.  5. History of right renal cyst.  6. History of leiomyoma of the stomach.  7. History of BPH, status post laser prostatectomy and TURP.   MEDICATIONS:  1. Avapro 150 mg p.o. daily.  2. Prozac 20 mg p.o. daily.  3. Toprol XL 25 mg p.o. q.h.s.  4. Zocor 20 mg p.o. daily.   ALLERGIES:  The patient denies any allergies.   SOCIAL HISTORY:  The patient states that he has smoked 4-5 cigars for about  25 years and states that he drinks about two  drinks of alcohol per day.   REVIEW OF SYSTEMS:  The patient complains of urgency and frequency urinary  symptoms.   PHYSICAL EXAMINATION:  GENERAL:  A 77 year old, well-developed, white male,  in no acute distress.  CARDIAC:  Regular rate and rhythm with no murmurs, rubs, or gallops noted.  RESPIRATORY:  Clear to auscultation bilaterally.  ABDOMEN:  Soft and nontender.  Positive bowel sounds.  GU:  A mass at the dome of his bladder visualized with cystoscopy today.    ASSESSMENT AND PLAN:  Have discussed possibility of biopsies to rule out  cancer of the prostate with his family.  After looking at the films and  discussing the x-rays with both Mr. and Mrs. Janet Berlin I. Patsi Sears,  M.D.  has elected, and the patient has elected for open prostatectomy.  It  was decided that if we do find some incidental cancer of the prostate, we  will follow up with radiation therapy in the future.     Terri Piedra, N.P.                         Sigmund I. Patsi Sears, M.D.    HB/MEDQ  D:  09/22/2002  T:  09/22/2002  Job:  [id number]

## 2010-11-02 NOTE — Op Note (Signed)
NAME:  Anthony Ho, Anthony Ho                            ACCOUNT NO.:  [id number]   MEDICAL RECORD NO.:  [id number]                   PATIENT TYPE:  INP   LOCATION:  0371                                 FACILITY:  [facility]   PHYSICIAN:  Sigmund I. Patsi Sears, M.D.         DATE OF BIRTH:  1931/12/24   DATE OF PROCEDURE:  09/20/2002  DATE OF DISCHARGE:                                 OPERATIVE REPORT   PREOPERATIVE DIAGNOSIS:  Bladder outlet obstruction.   POSTOPERATIVE DIAGNOSIS:  Bladder outlet obstruction.   PROCEDURE:  Suprapubic prostatectomy.   SURGEON:  Sigmund I. Patsi Sears, M.D.   ASSISTANTS:  1. Lindaann Slough, M.D.  2. Melvyn Novas, M.D.   DRAINS:  Suprapubic tube, Foley catheter, and Jackson-Pratt drain.   HISTORY OF PRESENT ILLNESS:  The patient is a 77 year old white male with a  history of benign prostatic hypertrophy, status post laser treatment in the  early 90's.  He recently represented with obstructive symptoms.  CT scan  demonstrated a large middle lobe of the prostate protruding out into the  bladder which was thought to be too large for transurethral resection.   DESCRIPTION OF PROCEDURE:  Following the administration of IV antibiotics  and general anesthesia, the patient was prepped and draped in the supine  position.  He was flexed slightly.  A midline incision was made beginning  superiorly approximately 4 cm below the umbilicus down to the pubic  symphysis.  This wound was opened in the midline using a skin knife and then  Bovie cautery down to the level of the rectus fascia.  The rectus fascia was  then divided along the midline.  The underlying transversalis, bladder, and  peritoneum were gently dissected off the posterior abdominal wall bluntly.  A Bookwalter retractor was then placed.  The bladder was filled via Foley  catheter.  Two Vicryl holding sutures were put in the bladder just either  side of the midline.  A midline incision was then made in the  bladder.  The  prostate was easily seen emanating out into the bladder.  The bladder walls  were retracted laterally using the Bookwalter.  Both ureteral orifices were  identified.  The large middle lobe of the prostate was then bluntly teased  off of its attachment to the remainder of the prostate at a small isthmus of  prostatic capsule.  We were able to completely and bluntly dissect this  large adenoma off of the prostate.  There was very little bleeding from  this.  A small site of oozing was fulgurated using Bovie cautery.  Again,  both ureteral orifices were intact.  A Foley catheter to be used a  suprapubic tube was then brought out the skin through a separate stab wound  and through the bladder through a separate stab wound.  The bladder was then  closed in two layers using 2-0 and 3-0 running Vicryl sutures.  A Foley  catheter was placed.  The rectus fascia was then closed using running #1  PDS.  The skin was then closed using staples.  The procedure was then  terminated.   ESTIMATED BLOOD LOSS:  Less then 100 cc.   COMPLICATIONS:  None.   DISPOSITION:  The patient was taken to the recovery room in stable  condition.  Dr. Patsi Sears was present and participated in this procedure.     Melvyn Novas, M.D.                      Sigmund I. Patsi Sears, M.D.    DK/MEDQ  D:  09/20/2002  T:  09/20/2002  Job:  [id number]

## 2010-11-02 NOTE — Discharge Summary (Signed)
   NAME:  Anthony Ho, Anthony Ho                            ACCOUNT NO.:  [id number]   MEDICAL RECORD NO.:  [id number]                   PATIENT TYPE:  INP   LOCATION:  0371                                 FACILITY:  [facility]   PHYSICIAN:  Sigmund I. Patsi Sears, M.D.         DATE OF BIRTH:  [birth year]   DATE OF ADMISSION:  09/20/2002  DATE OF DISCHARGE:  09/23/2002                                 DISCHARGE SUMMARY   HISTORY OF PRESENT ILLNESS:  [age]-year-old white male with history of  nephrogenic adenoma resection in the past.  Seen in our office because of  complaints of urinary frequency and urgency.  Cystoscopy shows a mass at the  dome of his bladder.  MRI confirmed that the patient has a large mass in his  bladder near the central and transitional zones of the prostate.  Course of  treatment was discussed with patient and open prostatectomy was performed.   PAST MEDICAL HISTORY:  1. Diabetes.  2. Hypertension.  3. Barrett's esophagitis.  4. Hepatic hemangioma.  5. History of renal cyst.  6. History of leiomyoma of the stomach.  7. History of BPH, status post TURP.   SOCIAL HISTORY:  The patient states that he smokes 4 to 5 cigars, for about  25 years.  He also states that he drinks about two alcoholic drinks per day.   MEDICATIONS:  1. Avapro 150 mg daily.  2. Prozac 20 mg p.o. daily.  3. Toprol-XL 25 mg p.o. q.h.s.  4. Zocor 20 mg p.o. daily.   ALLERGIES:  No known drug allergies.   HOSPITAL COURSE:  The patient was taken to the operating room where an open  prostatectomy was performed by Dr. Lynelle Smoke I. Tannenbaum.  The patient  remained stable postoperatively.  On postoperative day 3, his Foley catheter  was removed and his suprapubic tube was hooked up to drainage.  His JP drain  was also removed without any difficulty.  The patient was discharged to home  on this day.   DISCHARGE MEDICATIONS:  He is to resume his home medications.  Prescriptions  were given for  Percocet, Keflex and Urised for bladder spasms.   CONDITION ON DISCHARGE:  Stable.     Terri Piedra, N.P.                         Sigmund I. Patsi Sears, M.D.   HB/MEDQ  D:  09/23/2002  T:  09/23/2002  Job:  [id number]

## 2010-11-02 NOTE — Discharge Summary (Signed)
NAMEELIEL, DUDDING NO.:  [id number]   MEDICAL RECORD NO.:  [id number]          PATIENT TYPE:  INP   LOCATION:  3706                         FACILITY:  [facility]   PHYSICIAN:  Peter M. Swaziland, M.D.  DATE OF BIRTH:  1932-04-22   DATE OF ADMISSION:  04/24/2004  DATE OF DISCHARGE:  04/30/2004                                 DISCHARGE SUMMARY   HISTORY OF PRESENT ILLNESS:  Mr. Baldyga is a 77 year old male with a history  of atrial fibrillation.  He had had previous elective cardioversion,  November of 2004, but subsequently converted to atrial fibrillation.  He has  now had increasing symptoms of dyspnea and fatigue.  He was admitted for  initiation of antiarrhythmic drug therapy with amiodarone.  During his  hospital stay, we have also held his Coumadin in anticipation for carpal  tunnel surgery and will be covered with IV heparin.  For details of his past  medical history, social history, family history and physical exam, please  see admission history and physical.   ADMISSION LABORATORY DATA:  ECG showed atrial fibrillation with controlled  ventricular response.  Cannot rule out a lateral infarct, age undetermined.   Chest x-ray showed normal heart size, no active disease.   White count is 5500, hemoglobin 13.6, hematocrit 38.7, platelets 149,000.  PT was 14.2 with an INR of 1.3, the patient having held his Coumadin for the  past 2 days.  PTT was 33.  Sodium 134, potassium 3.3, chloride 104, CO2 25,  BUN 14, creatinine 1.3, glucose of 148.  Other chemistries were normal.  Glycosylated hemoglobin was 6.4%.  BNP level was 229.2.  Cholesterol 111,  HDL 39, LDL of 51, triglycerides 106.  TSH was 0.737, free T4 was 1.17.   HOSPITAL COURSE:  The patient was admitted to telemetry monitoring.  He was  started on IV heparin per pharmacy protocol.  He underwent pulmonary  function testing which showed an FVC of 3.64 L, which was 81% per reference,  FEV-1 of 2.91 L, which  was 96% predicted, and ratio of 80%.  His diffusion  capacity was normal.  The patient was initiated on oral amiodarone loading  at 400 mg 3 times a day for 3 days and then reduced to 400 mg b.i.d.  He  tolerated this medication well.  He did remain fairly bradycardic and so his  beta blocker was discontinued.  We did replete his potassium.  On April 26, 2004, the patient underwent left carpal tunnel release surgery by Dr.  Vania Rea. Supple.  This was uncomplicated.  Following surgery, he was started  back on his Coumadin.  He was maintained on IV heparin.  On April 28, 2004 in the p.m., the patient did convert to normal sinus rhythm with a  sinus bradycardia.  He remained in sinus rhythm throughout the remainder of  his hospital stay.  Followup ECG showed sinus bradycardia with a first-  degree atrioventricular block and a QTc of 492, otherwise, no acute changes.  Because of his bradycardia, his amiodarone was reduced to 400  mg per day.  On the day of discharge, April 30, 2004, his pro time was therapeutic at  21.9 with an INR of 2.5.  He was discharged home in stable condition on  April 30, 2004.   DISCHARGE DIAGNOSES:  1.  Atrial fibrillation, now converted to a sinus bradycardia on oral      amiodarone.  2.  Status post left carpal tunnel release.  3.  Diabetes, type 2.  4.  Hypertension.  5.  Hypercholesterolemia.   DISCHARGE MEDICATIONS:  1.  Coumadin 2.5 mg daily.  2.  Fluoxetine 20 mg per day.  3.  Avapro 150 mg daily.  4.  Norvasc 5 mg daily.  5.  Zocor 20 mg daily.  6.  TriCor 145 mg daily.  7.  Glucosamine 1500 units daily.  8.  Furosemide 20 mg b.i.d.  9.  Glucophage 500 mg daily.  10. Amiodarone 400 mg daily.  11. He is instructed to stop his Toprol.  12. He will have Vicodin ES p.r.n. left arm pain.   DIET:  He will remain on a low-salt, low-fat, ADA diet.   FOLLOWUP:  He will have his next pro time drawn this Thursday, May 03, 2004, at Dr.  Barry Dienes. Paterson's office.  He will follow up with Dr. Peter  M. Swaziland in 1 week.   DISCHARGE STATUS:  Improved.       PMJ/MEDQ  D:  04/30/2004  T:  04/30/2004  Job:  [id number]   cc:   Vania Rea. Supple, M.D.  [facility]  [address]  [address]  [place]  [place] [address]  Fax: [phone]   Barry Dienes. Eloise Harman, M.D.  [address]  [place]  [place] [address]  Fax: [phone]

## 2010-11-02 NOTE — Op Note (Signed)
NAME:  BALEY, LORIMER                            ACCOUNT NO.:  [id number]   MEDICAL RECORD NO.:  [id number]                   PATIENT TYPE:  OIB   LOCATION:  2857                                 FACILITY:  [facility]   PHYSICIAN:  Peter M. Swaziland, M.D.               DATE OF BIRTH:  1931-06-24   DATE OF PROCEDURE:  05/16/2003  DATE OF DISCHARGE:                                 OPERATIVE REPORT   PROCEDURE PERFORMED:  Elective cardioversion.   INDICATIONS FOR PROCEDURE:  The patient is a 77 year old male with  persistent atrial fibrillation.   DESCRIPTION OF PROCEDURE:  The patient was in atrial fibrillation at the  beginning of procedure with a rate of 65 beats per minute.  He  was given  200 mg of IV pentothal by Anesthesia.  He was subsequently administered  three biphasic synchronized DC shocks at 100, 150 and 200 joules.  He  converted to normal sinus rhythm after the third shock.  He had no  complications.   IMPRESSION:  Successful DC cardioversion.                                               Peter M. Swaziland, M.D.    PMJ/MEDQ  D:  05/16/2003  T:  05/16/2003  Job:  [id number]   cc:   Barry Dienes. Eloise Harman, M.D.  [address]  [place]  [place] [address]  Fax: [phone]

## 2010-11-02 NOTE — Op Note (Signed)
NAMEITAMAR, MCGOWAN                  ACCOUNT NO.:  [id number]   MEDICAL RECORD NO.:  [id number]          PATIENT TYPE:  INP   LOCATION:  3706                         FACILITY:  [facility]   PHYSICIAN:  Vania Rea. Supple, M.D.  DATE OF BIRTH:  08/01/1931   DATE OF PROCEDURE:  04/26/2004  DATE OF DISCHARGE:                                 OPERATIVE REPORT   PREOPERATIVE DIAGNOSIS:  Left carpal tunnel syndrome.   POSTOPERATIVE DIAGNOSIS:  Left carpal tunnel syndrome.   PROCEDURES:  Left carpal tunnel release.   SURGEON:  Vania Rea. Supple, M.D.   ANESTHESIA:  IV regional.   TOURNIQUET TIME:  Less than 30 minutes.   ESTIMATED BLOOD LOSS:  Minimal.   HISTORY:  Mr. Gluth is a 77 year old gentleman with a complex and extensive  past medical history significant for atrial fibrillation and CHF, who has  been under the care of Peter M. Swaziland, M.D.  Mr. Martinezlopez has ongoing  significant pain and functional limitations secondary to carpal tunnel  syndrome.  At this time, he is brought to the operating room for planned  left carpal tunnel release.   Preoperatively I discussed with Mr. Gearhart the treatment options, as well as  the risks versus benefits.  Possible surgical complications of bleeding,  infection, neurovascular injury, persistent pain and the fact that there may  be preexisting permanent neurologic damage were reviewed.  He understands,  accepts and agrees with our planned procedure.   In addition, Dr. Swaziland has coordinated the conversion of his anticoagulated  status and has admitted Mr. Rabbani for a combined cardioversion, as well as  planned carpal tunnel surgery.  Dr. Elvis Coil efforts to help coordinate Mr.  Rehfeldt care are greatly appreciated.   PROCEDURE IN DETAIL:  After undergoing routine preoperative evaluation, Mr.  Hinch had been appropriately recoagulated and brought to the operating room.  Placed supine on the operating table where an IV regional anesthetic was  established in the left upper extremity under tourniquet control.  He did  receive a gram of IV cephalosporin prophylactically preoperatively.  Under  control, the arm was sterilely prepped and draped in standard fashion.  A  longitudinal 4 cm incision was then made along the course of the ring finger  extending from the distal wrist flexion crease to the level of Kaplan's  cardinal line distally.  Skin flaps were elevated and bipolar electrocautery  was used for hemostasis.  Dissection carried deeply through the palmar  fashion to the level of the transverse carpal ligament.  This was then  divided immediately to the radial side of his ulnar insertions beginning at  the mid point and extending distally with care taken to protect the  superficial palmar arch.  Dissection was then carried proximally up into the  antecubital fascia of the forearm.  The contents of the carpal canal were  identified and probed.  There were no obvious mass lesions.  Final  hemostasis was obtained.  The wound was irrigated.  The skin incision was  closed with interrupted 4-0 nylon horizontal mattress sutures.  Additional  0.5%  plain Marcaine was  instilled along the skin edges.  Adaptic and a dry dressing were then  applied.  A bulky dressing was then placed with a volar plaster splint  maintaining the wrist in slight extension.  The tourniquet was then let down  with brisk capillary refill noted in all digits.  The patient was then  transferred to r in stable condition.      Sage.Pita   KMS/MEDQ  D:  04/26/2004  T:  04/26/2004  Job:  [id number]

## 2010-11-02 NOTE — H&P (Signed)
NAME:  Anthony Ho, Anthony A.                           ACCOUNT NO.:  [id number]   MEDICAL RECORD NO.:  [id number]                   PATIENT TYPE:  OIB   LOCATION:                                       FACILITY:  [facility]   PHYSICIAN:  Peter M. Swaziland, M.D.               DATE OF BIRTH:  03-31-1932   DATE OF ADMISSION:  05/16/2003  DATE OF DISCHARGE:                                HISTORY & PHYSICAL   DATE OF ADMISSION:  May 16, 2003   HISTORY OF PRESENT ILLNESS:  Anthony Ho is a 77 year old male with new onset  of atrial fibrillation who is now admitted for elective cardioversion.  The  patient was noted after a left lower lobe pneumonia to have an irregular  pulse.  He was found to be in atrial fibrillation.  He was treated with rate  control with beta blockers and anticoagulated with Coumadin.  His pneumonia  resolved with antibiotics.  He has had persistent atrial fibrillation since  that time.  He denies any palpitations, dizziness, or syncope.  He denies  any chest pain or shortness of breath.  He had subsequent evaluation with an  echocardiogram which demonstrated mild concentric LVH with normal systolic  function.  He had mild biatrial enlargement and mild aortic tricuspid and  mitral insufficiency.  He  is now admitted to attempt elective  cardioversion.   PAST MEDICAL HISTORY:  Significant for diabetes mellitus type 2.  He has  history of hypercholesterolemia and hypertension.  He has had previous  prostate surgery x3 and previous bladder surgery x2.  He has had removal of  a stomach tumor.  He has had previous right shoulder arthroscopy.  He has  had multiple surgeries on his left eye and subsequent enucleation.   CURRENT MEDICATIONS:  1. Coumadin 5 mg x3, 2.5 mg x4.  2. Fluoxetine 20 mg per day.  3. Avapro 150 mg per day.  4. Norvasc 5 mg t.i.d.  5. Toprol-XL 25 mg per day.  6. Zocor 20 mg per day.   ALLERGIES:  No known allergies.   SOCIAL HISTORY:  The patient is  retired, he is married, he has four  children.  He quit smoking in 1969 but smokes occasional cigar.  He drinks 3-  4 ounces of bourbon a day.   FAMILY HISTORY:  Father died at age 73 of myocardial infarction and  septicemia.  Mother died at age 53 with congestive heart failure.  One  sister had myocardial infarction age 40.   REVIEW OF SYSTEMS:  Otherwise unremarkable.   PHYSICAL EXAMINATION:  GENERAL:  The patient is a pleasant white male in no  apparent distress.  VITAL SIGNS:  His weight is 200, blood pressure is 112/70, pulse is 84 and  irregular.  HEENT:  He has no JVD or bruits.  He has a false left eye.  The right pupil  is round and reactive.  LUNGS:  Clear.  CARDIAC:  Without gallops, murmurs, or clicks.  ABDOMEN:  Soft and nontender.  He has no masses or bruits.  EXTREMITIES:  Without edema.  Pulses are 2+ and symmetric.  NEUROLOGIC:  Nonfocal.   LABORATORY DATA:  Chest x-ray showed previous left lower lobe infiltrate.  ECG showed atrial fibrillation with rapid ventricular response and  nonspecific T wave abnormality.  The patient has been therapeutic on his  Coumadin over the past month.   IMPRESSION:  1. Atrial fibrillation, new onset, persistent.  2. Left lower lobe pneumonia.  3. Hypertension.  4. Hypercholesterolemia.  5  Diabetes mellitus, diet controlled.   PLAN:  Elective cardioversion.                                                Peter M. Swaziland, M.D.    PMJ/MEDQ  D:  05/11/2003  T:  05/11/2003  Job:  [id number]   cc:   Barry Dienes. Eloise Harman, M.D.  [address]  [place]  [place] [address]  Fax: [phone]

## 2010-11-02 NOTE — Discharge Summary (Signed)
[facility]  Patient:    Anthony Ho, Anthony Ho                         MRN: [id number] Adm. Date:  [id number] Disc. Date: [id number] Attending:  Laqueta Jean                           Discharge Summary  FINAL DIAGNOSES: 1.  Escherichia coli urosepsis. 2.  Barretts esophagitis. 3.  Hepatic hemangioma. 4.  Right renal cyst.  OPERATIONS THIS ADMISSION:  None.  CONSULTANTS THIS HOSPITALIZATION:  Dr. Marina Goodell.  HISTORY:  Anthony Ho is a 77 -year-old, married, white male, who was in his normal state of health until 24 hours prior to admission, when the patient developed abdominal discomfort, diarrhea, followed by urinary frequency and dysuria.  He developed shaking chills, a temperature of 104, was admitted via the E.R. for IV antibiotics and evaluation.  PAST UROLOGIC HISTORY:  Significant for BPH, with bladder outlet obstruction, status post laser prostatectomy and TURP.  The patient denies flank pain.  PAST MEDICAL HISTORY: 1.  Significant for leiomyoma removal of the stomach. 2.  Diet-controlled diabetes.  TOBACCO:  The patient smokes four cigarettes a day, and drinks two alcoholic drinks per day.  FAMILY HISTORY:  Noncontributory.  REVIEW OF SYSTEMS:  Otherwise noncontributory, except for abdominal pain.  ADMISSION PHYSICAL EXAMINATION: VITAL SIGNS:  Shows a temperature of 101.4, pulse 88, respiratory rate 20, blood pressure 120/70.  Remaining physical examination is as noted on admission H&P of June 24.  ADMISSION LABORATORY DATA:  Shows urine culture with E. coli, pansensitive, blood culture with the same bacteria, C. diff toxin is negative (stool culture is negative).  White blood cell count 9600, hemoglobin 13.7, hematocrit 38.6 (repeat 40.6); glucose 190 (random), sodium 136, potassium 3.1 (repeat 3.7), chloride 105, CO2 25, bun 16, creatinine 1.5 (repeat 1.3).  The patients calcium is 8.8, protein 6.9, albumin 3.7.  Liver functions showed  total bilirubin 1.5 (slightly elevated).  Urinalysis shows many bacteria, with leukocyte esterase moderate with too numerous to count white blood cells and some red blood cells present.  HOSPITAL COURSE:  The patient was admitted and given IV antibiotics.  He continued to spike fevers.  As noted the patient was first felt to have gram positive cocci, but this was changed to gram negative rods per laboratory. Culture eventually grew a pure culture of E. coli, which was pansensitive.  CT of the abdomen showed no significant abnormalities, although the patient is noted to have a liver hemangioma, and a simple cyst of the right kidney, mild aortic arteriosclerosis but without aneurysm, with marked enlargement of the prostate, particularly the median lobe with sigmoid diverticula but no evidence of diverticulitis.  GI evaluation was accomplished because of the patients intermittent diarrhea, and abdominal pain.  He has a history of Barretts erosive esophagitis, and evaluation failed to show any active GI disease.  He was placed on Levbid for abdominal discomfort.  The patient is allowed to be discharged, afebrile, and feeling well on the evening of June 28.  He will be discharged on Levaquin 500 mg 1 q.d. for seven days.  He will return to the office for follow up.  He will also take levbid one b.i.d. p.r.n. abdominal pain.  He will take p.r.n. Pepcid. DD:  12/13/99 TD:  12/14/99 Job: [id number]

## 2010-11-02 NOTE — H&P (Signed)
Anthony Ho, Anthony Ho NO.:  [id number]   MEDICAL RECORD NO.:  [id number]          PATIENT TYPE:  INP   LOCATION:  3735                         FACILITY:  [facility]   PHYSICIAN:  Peter M. Swaziland, M.D.  DATE OF BIRTH:  04-02-1932   DATE OF ADMISSION:  04/24/2004  DATE OF DISCHARGE:                                HISTORY & PHYSICAL   HISTORY OF PRESENT ILLNESS:  Mr. Layson is a 77 year old white male with  history of atrial fibrillation diagnosed initially in October 2004.  He  underwent elective cardioversion on May 16, 2003, but subsequently  reverted back to atrial fibrillation. At that time, he was asymptomatic, and  he was treated with rate control with beta blocker therapy and Coumadin.  He  now presents with symptoms of increasing dyspnea and fatigue.  He has had  signs of early congestive heart failure.  Due to his now symptomatic status,  we have recommended initiation of antiarrhythmic drug therapy in order to  try to restore normal sinus rhythm.  For this purpose, the patient is now  admitted to telemetry monitoring.  He is also going to require surgery for  carpal tunnel syndrome on the left, and his Coumadin will be held in  anticipation for surgery with bridging heparin therapy.   PAST MEDICAL HISTORY:  1.  Atrial fibrillation.  2.  Diabetes mellitus, type 2.  3.  Hypertension.  4.  Hypercholesterolemia.  5.  History of mild central sleep apnea.  6.  Carpal tunnel syndrome.  7.  Status post prostate surgery x 3 and bladder surgery x 2.  8.  Status post right shoulder arthroscopy.  9.  Status post removal of a stomach tumor.  10. 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   ALLERGIES:  No known allergies.   CURRENT MEDICATIONS:  1.  Coumadin 3 mg 6 days a week, 5 mg once a week.  2.  Fluoxetine 20 mg per day.  3.  Avapro 150 mg per day.  4.  Norvasc 5 mg per day.  5.  Toprol XL 50 mg per day.  6.   Zocor 20 mg per day.  7.  TriCor 145 mg per day.  8.  Glucosamine 1500 mg per day.  9.  Furosemide 20 mg b.i.d.  10. Glucophage 500 mg per day.   SOCIAL HISTORY:  The patient is retired, married, has four children.  He  quit smoking in 1969.  He still drinks 3 to 4 ounces of bourbon a day.   FAMILY HISTORY:  His father died at age 45 with myocardial infarction and  septicemia.  Mother died at age 42 with congestive heart failure.  One  sister died at age 73 with myocardial infarction.   REVIEW OF SYSTEMS:  He has left arm pain and numbness.  He has had no  significant edema.  He does note increased dyspnea on exertion but no PND or  orthopnea.  He had no cough or hemoptysis.  No recent bowel or bladder  complaints.  No syncope.  Denies any chest pain.  Other Review of Systems  are negative.   PHYSICAL EXAMINATION:  GENERAL:  The patient is a pleasant white male in no  apparent distress.  VITAL SIGNS:  Blood pressure 118/70, pulse 72 and irregular, respirations  20.  Weight 201.  HEENT:  He has a false eye on the left, otherwise his right eye appears  normal.  Pupil is round and reactive.  Oropharynx is clear.  NECK:  Supple without JVD, adenopathy, thyromegaly, or bruits.  LUNGS:  Clear.  CARDIAC:  Irregular rate and rhythm without murmur, rub, or gallop.  ABDOMEN:  Soft and nontender.  He has no masses or hepatosplenomegaly.  EXTREMITIES:  Femoral and pedal pulses are 2+ and symmetric.  He has no  edema.  NEUROLOGIC:  Nonfocal.   IMPRESSION:  1.  Atrial fibrillation, persistent and now symptomatic.  2.  Carpal tunnel syndrome on the left.  3.  Diabetes mellitus.  4.  Hypertension.  5.  Hyperlipidemia.   PLAN:  1.  The patient will be admitted to telemetry.  2.  Will check baseline lab work including thyroid function studies and      pulmonary function studies.  3.  Will initiate oral amiodarone therapy.  4.  Will stop his Coumadin and bridge with IV heparin when his INR  drops      below 2.0.  5.  Anticipate carpal tunnel surgery later this week.       PMJ/MEDQ  D:  04/24/2004  T:  04/24/2004  Job:  [id number]   cc:   Vania Rea. Supple, M.D.  [facility]  [address]  [address]  [place]  [place] [address]  Fax: [phone]   Barry Dienes. Eloise Harman, M.D.  [address]  [place]  [place] [address]  Fax: [phone]

## 2011-03-09 ENCOUNTER — Encounter: Payer: Self-pay | Admitting: Cardiology

## 2011-03-25 ENCOUNTER — Ambulatory Visit: Payer: Self-pay | Admitting: Cardiology

## 2011-04-10 ENCOUNTER — Encounter: Payer: Self-pay | Admitting: Cardiology

## 2011-05-09 ENCOUNTER — Emergency Department (INDEPENDENT_AMBULATORY_CARE_PROVIDER_SITE_OTHER): Payer: Medicare Other

## 2011-05-09 ENCOUNTER — Encounter (HOSPITAL_BASED_OUTPATIENT_CLINIC_OR_DEPARTMENT_OTHER): Payer: Self-pay | Admitting: *Deleted

## 2011-05-09 ENCOUNTER — Emergency Department (HOSPITAL_BASED_OUTPATIENT_CLINIC_OR_DEPARTMENT_OTHER)
Admission: EM | Admit: 2011-05-09 | Discharge: 2011-05-09 | Disposition: A | Discharge status: Home / Self Care | Payer: Medicare Other | Attending: Emergency Medicine | Admitting: Emergency Medicine

## 2011-05-09 DIAGNOSIS — E785 Hyperlipidemia, unspecified: Secondary | ICD-10-CM | POA: Insufficient documentation

## 2011-05-09 DIAGNOSIS — G4730 Sleep apnea, unspecified: Secondary | ICD-10-CM | POA: Insufficient documentation

## 2011-05-09 DIAGNOSIS — F17200 Nicotine dependence, unspecified, uncomplicated: Secondary | ICD-10-CM | POA: Insufficient documentation

## 2011-05-09 DIAGNOSIS — L0390 Cellulitis, unspecified: Secondary | ICD-10-CM

## 2011-05-09 DIAGNOSIS — L539 Erythematous condition, unspecified: Secondary | ICD-10-CM

## 2011-05-09 DIAGNOSIS — R609 Edema, unspecified: Secondary | ICD-10-CM

## 2011-05-09 DIAGNOSIS — L02419 Cutaneous abscess of limb, unspecified: Secondary | ICD-10-CM | POA: Insufficient documentation

## 2011-05-09 DIAGNOSIS — M79609 Pain in unspecified limb: Secondary | ICD-10-CM

## 2011-05-09 DIAGNOSIS — I10 Essential (primary) hypertension: Secondary | ICD-10-CM | POA: Insufficient documentation

## 2011-05-09 DIAGNOSIS — L03119 Cellulitis of unspecified part of limb: Secondary | ICD-10-CM | POA: Insufficient documentation

## 2011-05-09 DIAGNOSIS — E119 Type 2 diabetes mellitus without complications: Secondary | ICD-10-CM | POA: Insufficient documentation

## 2011-05-09 LAB — PROTIME-INR
INR: 2.56 — ABNORMAL HIGH (ref 0.00–1.49)
Prothrombin Time: 27.9 seconds — ABNORMAL HIGH (ref 11.6–15.2)

## 2011-05-09 LAB — BASIC METABOLIC PANEL
BUN: 24 mg/dL — ABNORMAL HIGH (ref 6–23)
CO2: 25 mEq/L (ref 19–32)
Calcium: 9 mg/dL (ref 8.4–10.5)
Chloride: 102 mEq/L (ref 96–112)
Creatinine, Ser: 2.1 mg/dL — ABNORMAL HIGH (ref 0.50–1.35)
GFR calc Af Amer: 33 mL/min — ABNORMAL LOW (ref >90)
GFR calc non Af Amer: 29 mL/min — ABNORMAL LOW (ref >90)
Glucose, Bld: 192 mg/dL — ABNORMAL HIGH (ref 70–99)
Potassium: 4 mEq/L (ref 3.5–5.1)
Sodium: 137 mEq/L (ref 135–145)

## 2011-05-09 LAB — DIFFERENTIAL
Basophils Absolute: 0.1 10*3/uL (ref 0.0–0.1)
Basophils Relative: 1 % (ref 0–1)
Eosinophils Absolute: 0.3 10*3/uL (ref 0.0–0.7)
Eosinophils Relative: 3 % (ref 0–5)
Lymphocytes Relative: 18 % (ref 12–46)
Lymphs Abs: 1.9 10*3/uL (ref 0.7–4.0)
Monocytes Absolute: 2 10*3/uL — ABNORMAL HIGH (ref 0.1–1.0)
Monocytes Relative: 19 % — ABNORMAL HIGH (ref 3–12)
Neutro Abs: 6.1 10*3/uL (ref 1.7–7.7)
Neutrophils Relative %: 59 % (ref 43–77)

## 2011-05-09 LAB — CBC
HCT: 34 % — ABNORMAL LOW (ref 39.0–52.0)
Hemoglobin: 11.1 g/dL — ABNORMAL LOW (ref 13.0–17.0)
MCH: 26.2 pg (ref 26.0–34.0)
MCHC: 32.6 g/dL (ref 30.0–36.0)
MCV: 80.2 fL (ref 78.0–100.0)
Platelets: 179 10*3/uL (ref 150–400)
RBC: 4.24 MIL/uL (ref 4.22–5.81)
RDW: 15.7 % — ABNORMAL HIGH (ref 11.5–15.5)
WBC: 10.4 10*3/uL (ref 4.0–10.5)

## 2011-05-09 MED ORDER — CLINDAMYCIN HCL 150 MG PO CAPS: 300.0000 mg | ORAL_CAPSULE | Freq: Four times a day (QID) | ORAL | Status: DC

## 2011-05-09 MED ORDER — CLINDAMYCIN PHOSPHATE 900 MG/50ML IV SOLN
900.0000 mg | Freq: Once | INTRAVENOUS | Status: AC
  Administered 2011-05-09: 900 mg via INTRAVENOUS
  Filled 2011-05-09: qty 50

## 2011-05-09 MED ORDER — TETANUS-DIPHTH-ACELL PERTUSSIS 5-2.5-18.5 LF-MCG/0.5 IM SUSP
0.5000 mL | Freq: Once | INTRAMUSCULAR | Status: AC
  Administered 2011-05-09: 0.5 mL via INTRAMUSCULAR
  Filled 2011-05-09 (×2): qty 0.5

## 2011-05-09 NOTE — ED Notes (Signed)
pts family has remained at bedside.  pts status has remained unchanged.  Remains pain free

## 2011-05-09 NOTE — ED Notes (Signed)
Pt c/o left lower leg pain with redness and brusing

## 2011-05-09 NOTE — ED Provider Notes (Signed)
History     CSN: [id number] Arrival date & time: 05/09/2011  6:16 PM   First MD Initiated Contact with Patient 05/09/11 1834      Chief Complaint  Patient presents with  . Leg Pain    (Consider location/radiation/quality/duration/timing/severity/associated sxs/prior treatment) HPI Comments: Patient presents with increased pain to the anterior portion of the left lower leg. This started yesterday. He noticed yesterday a small stab on the lower anterior portion of the tibia. This morning he noticed some increased redness around that area. He also noticed increased bruising down around his foot. He denies any injury. Does not know how he got the scab on his leg. He is on Coumadin. Denies a history of blood clots. He is on Coumadin for atrial fibrillation. He complains of pain around the area of the scab in the redness. Denies a pain to the posterior calf. Tonight the pain to the upper leg. Denies a chest pain or shortness of breath. Denies any fevers. He states the pain has been constant and throbbing since yesterday.  Patient is a 77 y.o. male presenting with leg pain. The history is provided by the patient.  Leg Pain  The incident occurred yesterday. The incident occurred at home.    Past Medical History  Diagnosis Date  . SOB (shortness of breath)   . Lightheadedness   . Diabetes mellitus   . Hypertension   . Hyperlipidemia   . Atrial fibrillation   . Barrett esophagus   . Hiatal hernia   . Sleep apnea   . Osteopenia   . Dyspnea   . Aortic insufficiency   . Dizziness   . Chronic anticoagulation     Past Surgical History  Procedure Date  . Cardioversion 2004  . Prostate surgery   . Bladder surgery   . Carpal tunnel release   . Shoulder arthroscopy     RIGHT SHOULDER  . Tumor removal from stomach   . Eye surgery     MULTIPLE WITH ENUCLEATION ON THE LEFT  . Transthoracic echocardiogram 04/19/2010    EF 55-60%  . US echocardiography 03/31/2003    EF 60-65%  .  Cardiovascular stress test 04/19/2010    EF 72%    Family History  Problem Relation Age of Onset  . Heart failure Mother   . Atrial fibrillation Mother   . Heart attack Father     X2  . Heart attack Sister 34    History  Substance Use Topics  . Smoking status: Current Everyday Smoker    Types: Cigars  . Smokeless tobacco: Not on file  . Alcohol Use: No      Review of Systems  Allergies  Review of patient's allergies indicates no known allergies.  Home Medications   Current Outpatient Rx  Name Route Sig Dispense Refill  . ALENDRONATE SODIUM 70 MG PO TABS Oral Take 70 mg by mouth every 7 (seven) days. Take with a full glass of water on an empty stomach.     . AMIODARONE HCL 100 MG PO TABS Oral Take 100 mg by mouth daily.      Marland Kitchen AMITRIPTYLINE HCL 25 MG PO TABS Oral Take 25 mg by mouth at bedtime.      Marland Kitchen AMLODIPINE BESYLATE 5 MG PO TABS Oral Take 5 mg by mouth daily.      Marland Kitchen CALCIUM CARBONATE 600 MG PO TABS Oral Take 600 mg by mouth daily.      Marland Kitchen VITAMIN D 2000 UNITS PO TABS Oral  Take 2,000 Units by mouth daily.      Marland Kitchen CINNAMON PO Oral Take 1,000 mg by mouth daily.      . FENOFIBRATE 160 MG PO TABS Oral Take 160 mg by mouth daily.      . FUROSEMIDE 40 MG PO TABS Oral Take 40 mg by mouth daily.      Marland Kitchen LINAGLIPTIN 5 MG PO TABS Oral Take 5 mg by mouth at bedtime.     . METHYLPHENIDATE HCL 10 MG PO TABS Oral Take 10 mg by mouth 2 (two) times daily.      Marland Kitchen OLMESARTAN MEDOXOMIL 40 MG PO TABS Oral Take 40 mg by mouth daily.      Marland Kitchen PANTOPRAZOLE SODIUM 40 MG PO TBEC Oral Take 40 mg by mouth daily.      Marland Kitchen POTASSIUM BICARBONATE 25 MEQ PO TBEF Oral Take 25 mEq by mouth daily.      Marland Kitchen PRAVASTATIN SODIUM 40 MG PO TABS Oral Take 40 mg by mouth daily.      Marland Kitchen TAMSULOSIN HCL 0.4 MG PO CAPS Oral Take 0.4 mg by mouth daily.      . WARFARIN SODIUM 3 MG PO TABS Oral Take 3 mg by mouth daily. On Sunday, Tuesday, and Thursday    . WARFARIN SODIUM 3 MG PO TABS Oral Take 4.5 mg by mouth daily. On  Monday, Wednesday, Friday, Saturday     . CLINDAMYCIN HCL 150 MG PO CAPS Oral Take 2 capsules (300 mg total) by mouth every 6 (six) hours. 40 capsule 0    BP 172/48  Pulse 67  Temp(Src) 97.9 F (36.6 C) (Oral)  Resp 16  Ht 5\' 10"  (1.778 m)  Wt 215 lb (97.523 kg)  BMI 30.85 kg/m2  SpO2 98%  Physical Exam  ED Course  Procedures (including critical care time)  Results for orders placed during the hospital encounter of 05/09/11  CBC      Component Value Range   WBC 10.4  4.0 - 10.5 (K/uL)   RBC 4.24  4.22 - 5.81 (MIL/uL)   Hemoglobin 11.1 (*) 13.0 - 17.0 (g/dL)   HCT 16.1 (*) 09.6 - 52.0 (%)   MCV 80.2  78.0 - 100.0 (fL)   MCH 26.2  26.0 - 34.0 (pg)   MCHC 32.6  30.0 - 36.0 (g/dL)   RDW 04.5 (*) 40.9 - 15.5 (%)   Platelets 179  150 - 400 (K/uL)  DIFFERENTIAL      Component Value Range   Neutrophils Relative 59  43 - 77 (%)   Lymphocytes Relative 18  12 - 46 (%)   Monocytes Relative 19 (*) 3 - 12 (%)   Eosinophils Relative 3  0 - 5 (%)   Basophils Relative 1  0 - 1 (%)   Neutro Abs 6.1  1.7 - 7.7 (K/uL)   Lymphs Abs 1.9  0.7 - 4.0 (K/uL)   Monocytes Absolute 2.0 (*) 0.1 - 1.0 (K/uL)   Eosinophils Absolute 0.3  0.0 - 0.7 (K/uL)   Basophils Absolute 0.1  0.0 - 0.1 (K/uL)   Smear Review MORPHOLOGY UNREMARKABLE    PROTIME-INR      Component Value Range   Prothrombin Time 27.9 (*) 11.6 - 15.2 (seconds)   INR 2.56 (*) 0.00 - 1.49   BASIC METABOLIC PANEL      Component Value Range   Sodium 137  135 - 145 (mEq/L)   Potassium 4.0  3.5 - 5.1 (mEq/L)   Chloride 102  96 -  112 (mEq/L)   CO2 25  19 - 32 (mEq/L)   Glucose, Bld 192 (*) 70 - 99 (mg/dL)   BUN 24 (*) 6 - 23 (mg/dL)   Creatinine, Ser 1.61 (*) 0.50 - 1.35 (mg/dL)   Calcium 9.0  8.4 - 09.6 (mg/dL)   GFR calc non Af Amer 29 (*) >90 (mL/min)   GFR calc Af Amer 33 (*) >90 (mL/min)   Dg Tibia/fibula Left  05/09/2011  *RADIOLOGY REPORT*  Clinical Data: Wound involving the distal left lower leg anteriorly.  No known  injury.  Edema and erythema.  History of diabetes.  LEFT TIBIA AND FIBULA - 2 VIEW 05/09/2011:  Comparison: None.  Findings: No evidence of acute fracture involving the tibia or fibula.  Well-preserved bone mineral density.  No intrinsic osseous abnormalities.  Visualized knee joint and ankle joint intact.  No evidence of osteomyelitis.  Atherosclerotic calcification involving the posterior tibial artery.  Enthesopathy involving the insertion of the quadriceps tendon on the superior patella and at the insertion of the Achilles tendon on the posterior calcaneus.  IMPRESSION: Normal tibia-fibula.  Original Report Authenticated By: Arnell Sieving, M.D.     Dg Tibia/fibula Left  05/09/2011  *RADIOLOGY REPORT*  Clinical Data: Wound involving the distal left lower leg anteriorly.  No known injury.  Edema and erythema.  History of diabetes.  LEFT TIBIA AND FIBULA - 2 VIEW 05/09/2011:  Comparison: None.  Findings: No evidence of acute fracture involving the tibia or fibula.  Well-preserved bone mineral density.  No intrinsic osseous abnormalities.  Visualized knee joint and ankle joint intact.  No evidence of osteomyelitis.  Atherosclerotic calcification involving the posterior tibial artery.  Enthesopathy involving the insertion of the quadriceps tendon on the superior patella and at the insertion of the Achilles tendon on the posterior calcaneus.  IMPRESSION: Normal tibia-fibula.  Original Report Authenticated By: Arnell Sieving, M.D.     1. Cellulitis       MDM  Exam consistent with early cellulitis.  Doubt DVT, coumadin therapeutic.  No evidence of foreign body, fx.  Will tx with abx, return in 2 days for wound check or sooner for increased redness, worsening pain        Rolan Bucco, MD 05/10/11 0009

## 2011-05-11 ENCOUNTER — Encounter (HOSPITAL_BASED_OUTPATIENT_CLINIC_OR_DEPARTMENT_OTHER): Payer: Self-pay | Admitting: Emergency Medicine

## 2011-05-11 ENCOUNTER — Emergency Department (HOSPITAL_BASED_OUTPATIENT_CLINIC_OR_DEPARTMENT_OTHER)
Admission: EM | Admit: 2011-05-11 | Discharge: 2011-05-11 | Disposition: A | Discharge status: Home / Self Care | Payer: Medicare Other | Attending: Emergency Medicine | Admitting: Emergency Medicine

## 2011-05-11 DIAGNOSIS — L02419 Cutaneous abscess of limb, unspecified: Secondary | ICD-10-CM | POA: Insufficient documentation

## 2011-05-11 DIAGNOSIS — F17200 Nicotine dependence, unspecified, uncomplicated: Secondary | ICD-10-CM | POA: Insufficient documentation

## 2011-05-11 DIAGNOSIS — Z79899 Other long term (current) drug therapy: Secondary | ICD-10-CM | POA: Insufficient documentation

## 2011-05-11 DIAGNOSIS — E119 Type 2 diabetes mellitus without complications: Secondary | ICD-10-CM | POA: Insufficient documentation

## 2011-05-11 DIAGNOSIS — I4891 Unspecified atrial fibrillation: Secondary | ICD-10-CM | POA: Insufficient documentation

## 2011-05-11 DIAGNOSIS — L03119 Cellulitis of unspecified part of limb: Secondary | ICD-10-CM | POA: Insufficient documentation

## 2011-05-11 DIAGNOSIS — R0602 Shortness of breath: Secondary | ICD-10-CM | POA: Insufficient documentation

## 2011-05-11 DIAGNOSIS — L0390 Cellulitis, unspecified: Secondary | ICD-10-CM

## 2011-05-11 DIAGNOSIS — G4730 Sleep apnea, unspecified: Secondary | ICD-10-CM | POA: Insufficient documentation

## 2011-05-11 DIAGNOSIS — I10 Essential (primary) hypertension: Secondary | ICD-10-CM | POA: Insufficient documentation

## 2011-05-11 MED ORDER — CLINDAMYCIN HCL 150 MG PO CAPS: 300.0000 mg | ORAL_CAPSULE | Freq: Four times a day (QID) | ORAL | Status: AC

## 2011-05-11 NOTE — ED Notes (Signed)
Pt presents with redness to L calf reports "some improvement" Hx of Diabetes

## 2011-05-11 NOTE — ED Notes (Signed)
Family and Pt verbalize plan well pleasant conversation

## 2011-05-11 NOTE — ED Provider Notes (Signed)
History     CSN: [id number] Arrival date & time: 05/11/2011  8:22 AM   First MD Initiated Contact with Patient 05/11/11 0825      Chief Complaint  Patient presents with  . Wound Infection    L calf site with old scab and mininmal amount of redness    (Consider location/radiation/quality/duration/timing/severity/associated sxs/prior treatment) HPI Comments: The patient is a 77 year old male diabetic patient who presented on 05/09/2011 4 evaluation of redness and swelling around the scab on the anterior left lower leg that was diagnosed as cellulitis and initiated on treatment with clindamycin. The clindamycin was prescribed for 300 mg 4 times daily, but the quantity was written out for only a 5 day course, which I will extend to a ten-day course according to guidelines treatment of cellulitis. Otherwise, the patient reports that the redness and swelling and discomfort have all significantly improved since starting the antibiotic. At present he has a 1 cm scab on the anterior left lower leg with only a trace of erythema surrounding it, and were I to see this on his initial presentation, it would appear debatable as to whether he needed antibiotics at all, so this is clearly an improvement from his initial presentation.  Patient is a 77 y.o. male presenting with wound check. The history is provided by the patient, the spouse and medical records.  Wound Check  He was treated in the ED 2 to 3 days ago. Previous treatment in the ED includes oral antibiotics. Treatments since wound repair include oral antibiotics. The maximum temperature noted was less than 100.4 F. The temperature was taken using an oral thermometer. There has been no drainage from the wound. The redness has improved. The swelling has improved. The pain has improved. He has no difficulty moving the affected extremity or digit.    Past Medical History  Diagnosis Date  . SOB (shortness of breath)   . Lightheadedness   . Diabetes  mellitus   . Hypertension   . Hyperlipidemia   . Atrial fibrillation   . Barrett esophagus   . Hiatal hernia   . Sleep apnea   . Osteopenia   . Dyspnea   . Aortic insufficiency   . Dizziness   . Chronic anticoagulation     Past Surgical History  Procedure Date  . Cardioversion 2004  . Prostate surgery   . Bladder surgery   . Carpal tunnel release   . Shoulder arthroscopy     RIGHT SHOULDER  . Tumor removal from stomach   . Eye surgery     MULTIPLE WITH ENUCLEATION ON THE LEFT  . Transthoracic echocardiogram 04/19/2010    EF 55-60%  . US echocardiography 03/31/2003    EF 60-65%  . Cardiovascular stress test 04/19/2010    EF 72%    Family History  Problem Relation Age of Onset  . Heart failure Mother   . Atrial fibrillation Mother   . Heart attack Father     X2  . Heart attack Sister 58    History  Substance Use Topics  . Smoking status: Current Everyday Smoker    Types: Cigars  . Smokeless tobacco: Not on file  . Alcohol Use: No      Review of Systems  Constitutional: Negative for fever, chills and fatigue.  Respiratory: Negative for shortness of breath.   Cardiovascular: Negative for chest pain.  Gastrointestinal: Negative for abdominal pain and diarrhea.  Musculoskeletal: Negative for myalgias, joint swelling, arthralgias and gait problem.  Skin: Positive  for rash and wound.  Neurological: Negative for weakness and numbness.  Hematological: Negative for adenopathy. Bruises/bleeds easily.  Psychiatric/Behavioral: Negative.     Allergies  Review of patient's allergies indicates no known allergies.  Home Medications   Current Outpatient Rx  Name Route Sig Dispense Refill  . ALENDRONATE SODIUM 70 MG PO TABS Oral Take 70 mg by mouth every 7 (seven) days. Take with a full glass of water on an empty stomach.     . AMIODARONE HCL 100 MG PO TABS Oral Take 100 mg by mouth daily.      Marland Kitchen AMITRIPTYLINE HCL 25 MG PO TABS Oral Take 25 mg by mouth at bedtime.       Marland Kitchen AMLODIPINE BESYLATE 5 MG PO TABS Oral Take 5 mg by mouth daily.      Marland Kitchen CALCIUM CARBONATE 600 MG PO TABS Oral Take 600 mg by mouth daily.      Marland Kitchen VITAMIN D 2000 UNITS PO TABS Oral Take 2,000 Units by mouth daily.      Marland Kitchen CINNAMON PO Oral Take 1,000 mg by mouth daily.      Marland Kitchen CLINDAMYCIN HCL 150 MG PO CAPS Oral Take 2 capsules (300 mg total) by mouth every 6 (six) hours. 40 capsule 0  . FENOFIBRATE 160 MG PO TABS Oral Take 160 mg by mouth daily.      . FUROSEMIDE 40 MG PO TABS Oral Take 40 mg by mouth daily.      Marland Kitchen LINAGLIPTIN 5 MG PO TABS Oral Take 5 mg by mouth at bedtime.     . METHYLPHENIDATE HCL 10 MG PO TABS Oral Take 10 mg by mouth 2 (two) times daily.      Marland Kitchen OLMESARTAN MEDOXOMIL 40 MG PO TABS Oral Take 40 mg by mouth daily.      Marland Kitchen PANTOPRAZOLE SODIUM 40 MG PO TBEC Oral Take 40 mg by mouth daily.      Marland Kitchen POTASSIUM BICARBONATE 25 MEQ PO TBEF Oral Take 25 mEq by mouth daily.      Marland Kitchen PRAVASTATIN SODIUM 40 MG PO TABS Oral Take 40 mg by mouth daily.      Marland Kitchen TAMSULOSIN HCL 0.4 MG PO CAPS Oral Take 0.4 mg by mouth daily.      . WARFARIN SODIUM 3 MG PO TABS Oral Take 3 mg by mouth daily. On Sunday, Tuesday, and Thursday    . WARFARIN SODIUM 3 MG PO TABS Oral Take 4.5 mg by mouth daily. On Monday, Wednesday, Friday, Saturday       BP 155/54  Pulse 66  Temp 98.6 F (37 C)  Resp 22  Wt 215 lb (97.523 kg)  SpO2 99%  Physical Exam  Nursing note and vitals reviewed. Constitutional: He is oriented to person, place, and time. He appears well-developed and well-nourished. No distress.  HENT:  Head: Normocephalic and atraumatic.  Eyes: Conjunctivae and EOM are normal. Pupils are equal, round, and reactive to light.  Neck: Normal range of motion. Neck supple.  Cardiovascular: Normal rate and regular rhythm.   Pulmonary/Chest: Effort normal. No respiratory distress.  Musculoskeletal: Normal range of motion. He exhibits edema and tenderness.  Neurological: He is alert and oriented to person,  place, and time. He has normal reflexes. No cranial nerve deficit. Coordination normal.  Skin: Skin is warm and dry. Rash noted. He is not diaphoretic. There is erythema. No pallor.  Psychiatric: He has a normal mood and affect. His behavior is normal. Judgment and thought content normal.  ED Course  Procedures (including critical care time)  Labs Reviewed - No data to display Dg Tibia/fibula Left  05/09/2011  *RADIOLOGY REPORT*  Clinical Data: Wound involving the distal left lower leg anteriorly.  No known injury.  Edema and erythema.  History of diabetes.  LEFT TIBIA AND FIBULA - 2 VIEW 05/09/2011:  Comparison: None.  Findings: No evidence of acute fracture involving the tibia or fibula.  Well-preserved bone mineral density.  No intrinsic osseous abnormalities.  Visualized knee joint and ankle joint intact.  No evidence of osteomyelitis.  Atherosclerotic calcification involving the posterior tibial artery.  Enthesopathy involving the insertion of the quadriceps tendon on the superior patella and at the insertion of the Achilles tendon on the posterior calcaneus.  IMPRESSION: Normal tibia-fibula.  Original Report Authenticated By: Arnell Sieving, M.D.     No diagnosis found.    MDM  Apparent improvement of cellulitis with treatment on oral clindamycin. There is no suggestion on physical exam of DVT, as there is no swelling or tenderness or palpable cord to the posterior left lower leg. We will continue in treatment with clindamycin but I will extend course of antibiotics about 10 days for a full course. Patient states his understanding of and agreement with the plan of care.       Felisa Bonier, MD 05/11/11 [phone]

## 2011-05-24 ENCOUNTER — Encounter: Payer: Self-pay | Admitting: Cardiology

## 2011-05-24 ENCOUNTER — Ambulatory Visit (INDEPENDENT_AMBULATORY_CARE_PROVIDER_SITE_OTHER): Payer: Medicare Other | Admitting: Cardiology

## 2011-05-24 VITALS — BP 132/62 | HR 68 | Ht 70.5 in | Wt 218.0 lb

## 2011-05-24 DIAGNOSIS — E119 Type 2 diabetes mellitus without complications: Secondary | ICD-10-CM

## 2011-05-24 DIAGNOSIS — I4891 Unspecified atrial fibrillation: Secondary | ICD-10-CM | POA: Insufficient documentation

## 2011-05-24 DIAGNOSIS — I359 Nonrheumatic aortic valve disorder, unspecified: Secondary | ICD-10-CM

## 2011-05-24 DIAGNOSIS — I351 Nonrheumatic aortic (valve) insufficiency: Secondary | ICD-10-CM | POA: Insufficient documentation

## 2011-05-24 DIAGNOSIS — I10 Essential (primary) hypertension: Secondary | ICD-10-CM

## 2011-05-24 NOTE — Progress Notes (Signed)
Anthony Ho Date of Birth: [birth year] Medical Record #[id number]  History of Present Illness: Mr. Lahaie is seen for yearly followup. He reports presenting to the hospital ER in November with a raised lesion on his left calf. He was treated for cellulitis. He had significant swelling of his leg at that time. The swelling has resolved. His cellulitis has resolved. He is now wearing support is. He denies any significant cardiac complaints. He really denies any dyspnea today. He's had no chest pain or shortness of breath. He denies any palpitations. He is currently living at the [facility] home and is eating more heavily than before. He has gained 10 pounds this year. Apparently blood sugars were elevated on his hospital visit.  Current Outpatient Prescriptions on File Prior to Visit  Medication Sig Dispense Refill  . alendronate (FOSAMAX) 70 MG tablet Take 70 mg by mouth every 7 (seven) days. Take with a full glass of water on an empty stomach.       Marland Kitchen amiodarone (PACERONE) 100 MG tablet Take 100 mg by mouth daily.        Marland Kitchen amitriptyline (ELAVIL) 25 MG tablet Take 25 mg by mouth at bedtime.        Marland Kitchen amLODipine (NORVASC) 5 MG tablet Take 5 mg by mouth daily.        . calcium carbonate (OS-CAL) 600 MG TABS Take 600 mg by mouth daily.        . Cholecalciferol (VITAMIN D) 2000 UNITS tablet Take 2,000 Units by mouth daily.        Marland Kitchen CINNAMON PO Take 1,000 mg by mouth daily.        . fenofibrate 160 MG tablet Take 160 mg by mouth daily.        . furosemide (LASIX) 40 MG tablet Take 40 mg by mouth daily.        Marland Kitchen linagliptin (TRADJENTA) 5 MG TABS tablet Take 5 mg by mouth at bedtime.       . methylphenidate (RITALIN) 10 MG tablet Take 10 mg by mouth 2 (two) times daily.        Marland Kitchen olmesartan (BENICAR) 40 MG tablet Take 40 mg by mouth daily.        . pantoprazole (PROTONIX) 40 MG tablet Take 40 mg by mouth daily.        . potassium bicarbonate (K-LYTE) 25 MEQ disintegrating tablet Take 25 mEq by mouth daily.         . pravastatin (PRAVACHOL) 40 MG tablet Take 40 mg by mouth daily.        . Tamsulosin HCl (FLOMAX) 0.4 MG CAPS Take 0.4 mg by mouth daily.        Marland Kitchen warfarin (COUMADIN) 3 MG tablet Take 3 mg by mouth daily. On Sunday, Tuesday, and Thursday      . warfarin (COUMADIN) 3 MG tablet Take 1.5 mg by mouth daily. On Monday, Wednesday, Friday, Saturday      . clindamycin (CLEOCIN) 150 MG capsule Take 2 capsules (300 mg total) by mouth every 6 (six) hours.  40 capsule  0    No Known Allergies  Past Medical History  Diagnosis Date  . SOB (shortness of breath)   . Lightheadedness   . Diabetes mellitus   . Hypertension   . Hyperlipidemia   . Atrial fibrillation   . Barrett esophagus   . Hiatal hernia   . Sleep apnea   . Osteopenia   . Dyspnea   . Aortic insufficiency   .  Dizziness   . Chronic anticoagulation     Past Surgical History  Procedure Date  . Cardioversion 2004  . Prostate surgery   . Bladder surgery   . Carpal tunnel release   . Shoulder arthroscopy     RIGHT SHOULDER  . Tumor removal from stomach   . Eye surgery     MULTIPLE WITH ENUCLEATION ON THE LEFT  . Transthoracic echocardiogram 04/19/2010    EF 55-60%  . US echocardiography 03/31/2003    EF 60-65%  . Cardiovascular stress test 04/19/2010    EF 72%    History  Smoking status  . Current Everyday Smoker  . Types: Cigars  Smokeless tobacco  . Not on file    History  Alcohol Use No    Family History  Problem Relation Age of Onset  . Heart failure Mother   . Atrial fibrillation Mother   . Heart attack Father     X2  . Heart attack Sister 73    Review of Systems: The review of systems is positive for recent lower extremity edema. This has improved..  All other systems were reviewed and are negative.  Physical Exam: BP 132/62  Pulse 68  Ht 5' 10.5" (1.791 m)  Wt 218 lb (98.884 kg)  BMI 30.84 kg/m2 The patient is alert and oriented x 3.  The mood and affect are normal.  The skin is warm  and dry.  Color is normal.  The HEENT exam reveals that the sclera are nonicteric.  The mucous membranes are moist.  The carotids are 2+ without bruits.  There is no thyromegaly.  There is no JVD.  The lungs are clear.  The chest wall is non tender.  The heart exam reveals a regular rate with a normal S1 and S2.  There are no murmurs, gallops, or rubs.  The PMI is not displaced.   Abdominal exam reveals good bowel sounds.  There is no guarding or rebound.  There is no hepatosplenomegaly or tenderness.  There are no masses.  Exam of the legs reveal trace edema.  The legs are without rashes.  The distal pulses are intact.  Cranial nerves II - XII are intact.  Motor and sensory functions are intact.  The gait is normal.  LABORATORY DATA:   Assessment / Plan:

## 2011-05-24 NOTE — Assessment & Plan Note (Signed)
Blood pressure is adequately controlled today. I stressed the importance of sodium restriction. He needs to get regular aerobic exercise for 30 minutes of walking a day. He needs to lose weight. We discussed the fact that he needs to reduce his sweets and starch intake and limit his portion sizes more.

## 2011-05-24 NOTE — Patient Instructions (Addendum)
Continue your current medication.  You need to eat less and lose weight. You should especially avoid sweets, starches, and sodium. Reduce your portion sizes.  You need to increase your aerobic exercise by walking 30 minutes a day.  I will see you again in 6 months.

## 2011-05-24 NOTE — Assessment & Plan Note (Signed)
Moderate aortic insufficiency by echocardiogram in November of 2011. Exam is really unremarkable. He is asymptomatic. We will continue to monitor.

## 2011-05-24 NOTE — Assessment & Plan Note (Signed)
No evidence of recurrent atrial fibrillation on amiodarone. He is currently on 100 mg per day. He is scheduled for complete physical with Dr. Eloise Harman and should have his chemistries, liver function, and TSH checked at that time. We'll plan a followup again in 6 months.

## 2011-06-14 ENCOUNTER — Encounter: Payer: Self-pay | Admitting: Cardiology

## 2011-11-28 ENCOUNTER — Encounter: Payer: Self-pay | Admitting: Cardiology

## 2011-11-28 ENCOUNTER — Ambulatory Visit (INDEPENDENT_AMBULATORY_CARE_PROVIDER_SITE_OTHER): Payer: Medicare Other | Admitting: Cardiology

## 2011-11-28 VITALS — BP 153/68 | HR 59 | Ht 70.0 in | Wt 220.0 lb

## 2011-11-28 DIAGNOSIS — I10 Essential (primary) hypertension: Secondary | ICD-10-CM

## 2011-11-28 DIAGNOSIS — R609 Edema, unspecified: Secondary | ICD-10-CM

## 2011-11-28 DIAGNOSIS — I359 Nonrheumatic aortic valve disorder, unspecified: Secondary | ICD-10-CM

## 2011-11-28 DIAGNOSIS — I4891 Unspecified atrial fibrillation: Secondary | ICD-10-CM

## 2011-11-28 DIAGNOSIS — I351 Nonrheumatic aortic (valve) insufficiency: Secondary | ICD-10-CM

## 2011-11-28 NOTE — Patient Instructions (Addendum)
Continue your current therapy.  Increase your aerobic activity.  We will get your lab work from Dr. Eloise Harman called, Synetta Fail will send labs from 05/2011  .Your physician wants you to follow-up in: 6 months  You will receive a reminder letter in the mail two months in advance. If you don't receive a letter, please call our office to schedule the follow-up appointment.  1.5 Gram Low Sodium Diet A 1.5 gram sodium diet restricts the amount of sodium in the diet to no more than 1.5 g or 1500 mg daily. The American Heart Association recommends Americans over the age of 44 to consume no more than 1500 mg of sodium each day to reduce the risk of developing high blood pressure. Research also shows that limiting sodium may reduce heart attack and stroke risk. Many foods contain sodium for flavor and sometimes as a preservative. When the amount of sodium in a diet needs to be low, it is important to know what to look for when choosing foods and drinks. The following includes some information and guidelines to help make it easier for you to adapt to a low sodium diet. QUICK TIPS  Do not add salt to food.   Avoid convenience items and fast food.   Choose unsalted snack foods.   Buy lower sodium products, often labeled as "lower sodium" or "no salt added."   Check food labels to learn how much sodium is in 1 serving.   When eating at a restaurant, ask that your food be prepared with less salt or none, if possible.  READING FOOD LABELS FOR SODIUM INFORMATION The nutrition facts label is a good place to find how much sodium is in foods. Look for products with no more than 400 mg of sodium per serving. Remember that 1.5 g = 1500 mg. The food label may also list foods as:  Sodium-free: Less than 5 mg in a serving.   Very low sodium: 35 mg or less in a serving.   Low-sodium: 140 mg or less in a serving.   Light in sodium: 50% less sodium in a serving. For example, if a food that usually has 300 mg of  sodium is changed to become light in sodium, it will have 150 mg of sodium.   Reduced sodium: 25% less sodium in a serving. For example, if a food that usually has 400 mg of sodium is changed to reduced sodium, it will have 300 mg of sodium.  CHOOSING FOODS Grains  Avoid: Salted crackers and snack items. Some cereals, including instant hot cereals. Bread stuffing and biscuit mixes. Seasoned rice or pasta mixes.   Choose: Unsalted snack items. Low-sodium cereals, oats, puffed wheat and rice, shredded wheat. English muffins and bread. Pasta.  Meats  Avoid: Salted, canned, smoked, spiced, pickled meats, including fish and poultry. Bacon, ham, sausage, cold cuts, hot dogs, anchovies.   Choose: Low-sodium canned tuna and salmon. Fresh or frozen meat, poultry, and fish.  Dairy  Avoid: Processed cheese and spreads. Cottage cheese. Buttermilk and condensed milk. Regular cheese.   Choose: Milk. Low-sodium cottage cheese. Yogurt. Sour cream. Low-sodium cheese.  Fruits and Vegetables  Avoid: Regular canned vegetables. Regular canned tomato sauce and paste. Frozen vegetables in sauces. Olives. Rosita Fire. Relishes. Sauerkraut.   Choose: Low-sodium canned vegetables. Low-sodium tomato sauce and paste. Frozen or fresh vegetables. Fresh and frozen fruit.  Condiments  Avoid: Canned and packaged gravies. Worcestershire sauce. Tartar sauce. Barbecue sauce. Soy sauce. Steak sauce. Ketchup. Onion, garlic, and table salt.  Meat flavorings and tenderizers.   Choose: Fresh and dried herbs and spices. Low-sodium varieties of mustard and ketchup. Lemon juice. Tabasco sauce. Horseradish.  SAMPLE 1.5 GRAM SODIUM MEAL PLAN Breakfast / Sodium (mg)  1 cup low-fat milk / 143 mg   1 whole-wheat English muffin / 240 mg   1 tbs heart-healthy margarine / 153 mg   1 hard-boiled egg / 139 mg   1 small orange / 0 mg  Lunch / Sodium (mg)  1 cup raw carrots / 76 mg   2 tbs no salt added peanut butter / 5 mg   2  slices whole-wheat bread / 270 mg   1 tbs jelly / 6 mg    cup red grapes / 2 mg  Dinner / Sodium (mg)  1 cup whole-wheat pasta / 2 mg   1 cup low-sodium tomato sauce / 73 mg   3 oz lean ground beef / 57 mg   1 small side salad (1 cup raw spinach leaves,  cup cucumber,  cup yellow bell pepper) with 1 tsp olive oil and 1 tsp red wine vinegar / 25 mg  Snack / Sodium (mg)  1 container low-fat vanilla yogurt / 107 mg   3 graham cracker squares / 127 mg  Nutrient Analysis  Calories: 1745   Protein: 75 g   Carbohydrate: 237 g   Fat: 57 g   Sodium: 1425 mg  Document Released: 06/03/2005 Document Revised: 05/23/2011 Document Reviewed: 09/04/2009 [facility] Patient Information 2012 [place], [place].

## 2011-11-29 NOTE — Assessment & Plan Note (Signed)
Blood pressure is mildly elevated today. He is mildly volume overloaded. He eats a high sodium diet. We reviewed in detail a low-sodium diet. He will continue with his current dose of diuretics. Continue amlodipine 5 mg daily and Benicar 40 mg daily.

## 2011-11-29 NOTE — Assessment & Plan Note (Signed)
He is maintaining sinus rhythm while on low-dose of amiodarone. On his next followup visit we will check liver function studies and thyroid studies. Will continue with his current treatment including Coumadin.

## 2011-11-29 NOTE — Progress Notes (Signed)
Anthony Ho Date of Birth: [birth year] Medical Record #[id number]  History of Present Illness: Anthony Ho is seen for  followup. He reports he is doing well from a cardiac standpoint. He does have chronic swelling in both legs but he is wearing his support hose. He is using his CPAP therapy at night. He does work in his yard and garden but doesn't really do any formal exercise. He denies any palpitations. He's had no significant chest pain or shortness of breath. He reports his blood sugars are doing well.  Current Outpatient Prescriptions on File Prior to Visit  Medication Sig Dispense Refill  . alendronate (FOSAMAX) 70 MG tablet Take 70 mg by mouth every 7 (seven) days. Take with a full glass of water on an empty stomach.       Marland Kitchen amiodarone (PACERONE) 100 MG tablet Take 100 mg by mouth daily.        Marland Kitchen amLODipine (NORVASC) 5 MG tablet Take 5 mg by mouth daily.        . calcium carbonate (OS-CAL) 600 MG TABS Take 600 mg by mouth daily.        . Cholecalciferol (VITAMIN D) 2000 UNITS tablet Take 2,000 Units by mouth daily.        Marland Kitchen CINNAMON PO Take 1,000 mg by mouth daily.        . ferrous sulfate 325 (65 FE) MG tablet Take 325 mg by mouth daily with breakfast.      . furosemide (LASIX) 40 MG tablet Take 40 mg by mouth daily.        Marland Kitchen linagliptin (TRADJENTA) 5 MG TABS tablet Take 5 mg by mouth at bedtime.       Marland Kitchen losartan (COZAAR) 100 MG tablet Take 100 mg by mouth daily.      . methylphenidate (RITALIN) 10 MG tablet Take 10 mg by mouth 2 (two) times daily.        . pantoprazole (PROTONIX) 40 MG tablet Take 40 mg by mouth daily.        . potassium bicarbonate (K-LYTE) 25 MEQ disintegrating tablet Take 25 mEq by mouth daily.        . pravastatin (PRAVACHOL) 40 MG tablet Take 40 mg by mouth daily.        . Tamsulosin HCl (FLOMAX) 0.4 MG CAPS Take 0.4 mg by mouth daily.        Marland Kitchen warfarin (COUMADIN) 3 MG tablet Take 3 mg by mouth daily. On Sunday, Tuesday, and Thursday      . warfarin (COUMADIN)  3 MG tablet Take 1.5 mg by mouth daily. On Monday, Wednesday, Friday, Saturday        No Known Allergies  Past Medical History  Diagnosis Date  . Diabetes mellitus   . Hypertension   . Hyperlipidemia   . Atrial fibrillation   . Barrett esophagus   . Hiatal hernia   . Sleep apnea   . Osteopenia   . Dyspnea   . Aortic insufficiency   . Dizziness   . Chronic anticoagulation     Past Surgical History  Procedure Date  . Cardioversion 2004  . Prostate surgery   . Bladder surgery   . Carpal tunnel release   . Shoulder arthroscopy     RIGHT SHOULDER  . Tumor removal from stomach   . Eye surgery     MULTIPLE WITH ENUCLEATION ON THE LEFT  . Transthoracic echocardiogram 04/19/2010    EF 55-60%  . US echocardiography 03/31/2003  EF 60-65%  . Cardiovascular stress test 04/19/2010    EF 72%    History  Smoking status  . Current Everyday Smoker  . Types: Cigars  Smokeless tobacco  . Not on file    History  Alcohol Use No    Family History  Problem Relation Age of Onset  . Heart failure Mother   . Atrial fibrillation Mother   . Heart attack Father     X2  . Heart attack Sister 24    Review of Systems: The review of systems is positive for chronic lower extremity edema.   All other systems were reviewed and are negative.  Physical Exam: BP 153/68  Pulse 59  Ht 5\' 10"  (1.778 m)  Wt 99.791 kg (220 lb)  BMI 31.57 kg/m2 The patient is alert and oriented x 3.  The mood and affect are normal.  The skin is warm and dry.  Color is normal.  The HEENT exam reveals that the sclera are nonicteric.  The mucous membranes are moist.  The carotids are 2+ without bruits.  There is no thyromegaly.  There is no JVD.  The lungs are clear.  The chest wall is non tender.  The heart exam reveals a regular rate with a normal S1 and S2.  There are no murmurs, gallops, or rubs.  The PMI is not displaced.   Abdominal exam reveals good bowel sounds.  There is no guarding or rebound.  There  is no hepatosplenomegaly or tenderness.  There are no masses.  Exam of the legs reveal 1-2+ edema.  The legs are without rashes.  The distal pulses are intact.  Cranial nerves II - XII are intact.  Motor and sensory functions are intact.  The gait is normal.  LABORATORY DATA: ECG demonstrates normal sinus rhythm with a first degree AV block. Rate is 56 beats per minute. It is otherwise normal.  Assessment / Plan:

## 2011-12-03 NOTE — Addendum Note (Signed)
Addended by: Reine Just on: 12/03/2011 04:58 PM   Modules accepted: Orders

## 2012-04-23 ENCOUNTER — Telehealth: Payer: Self-pay | Admitting: Gastroenterology

## 2012-04-24 NOTE — Telephone Encounter (Signed)
Patient states he was just calling to set up an appt and he is scheduled for 05/01/12.

## 2012-04-24 NOTE — Telephone Encounter (Signed)
lmom for pt to call back. ECL 08/23/09 with EGD having ulcerations in the distal esophagus, Barrett's and Candida; no hi grade dysplasia noted and COLON was OK.

## 2012-04-27 ENCOUNTER — Encounter: Payer: Self-pay | Admitting: *Deleted

## 2012-05-01 ENCOUNTER — Ambulatory Visit (INDEPENDENT_AMBULATORY_CARE_PROVIDER_SITE_OTHER): Payer: Medicare Other | Admitting: Gastroenterology

## 2012-05-01 ENCOUNTER — Encounter: Payer: Self-pay | Admitting: Gastroenterology

## 2012-05-01 ENCOUNTER — Other Ambulatory Visit (INDEPENDENT_AMBULATORY_CARE_PROVIDER_SITE_OTHER): Payer: Medicare Other

## 2012-05-01 VITALS — BP 142/60 | HR 60 | Ht 70.5 in | Wt 220.6 lb

## 2012-05-01 DIAGNOSIS — Z7901 Long term (current) use of anticoagulants: Secondary | ICD-10-CM

## 2012-05-01 DIAGNOSIS — D214 Benign neoplasm of connective and other soft tissue of abdomen: Secondary | ICD-10-CM

## 2012-05-01 DIAGNOSIS — I4891 Unspecified atrial fibrillation: Secondary | ICD-10-CM

## 2012-05-01 DIAGNOSIS — R195 Other fecal abnormalities: Secondary | ICD-10-CM

## 2012-05-01 DIAGNOSIS — K2270 Barrett's esophagus without dysplasia: Secondary | ICD-10-CM

## 2012-05-01 DIAGNOSIS — D649 Anemia, unspecified: Secondary | ICD-10-CM

## 2012-05-01 DIAGNOSIS — K219 Gastro-esophageal reflux disease without esophagitis: Secondary | ICD-10-CM

## 2012-05-01 LAB — CBC WITH DIFFERENTIAL/PLATELET
Basophils Absolute: 0.1 10*3/uL (ref 0.0–0.1)
Basophils Relative: 0.6 % (ref 0.0–3.0)
Eosinophils Absolute: 0.3 10*3/uL (ref 0.0–0.7)
Eosinophils Relative: 3.4 % (ref 0.0–5.0)
HCT: 42 % (ref 39.0–52.0)
Hemoglobin: 14.1 g/dL (ref 13.0–17.0)
Lymphocytes Relative: 16.8 % (ref 12.0–46.0)
Lymphs Abs: 1.7 10*3/uL (ref 0.7–4.0)
MCHC: 33.7 g/dL (ref 30.0–36.0)
MCV: 84.2 fl (ref 78.0–100.0)
Monocytes Absolute: 1.7 10*3/uL — ABNORMAL HIGH (ref 0.1–1.0)
Monocytes Relative: 16.9 % — ABNORMAL HIGH (ref 3.0–12.0)
Neutro Abs: 6.4 10*3/uL (ref 1.4–7.7)
Neutrophils Relative %: 62.3 % (ref 43.0–77.0)
Platelets: 164 10*3/uL (ref 150.0–400.0)
RBC: 4.99 Mil/uL (ref 4.22–5.81)
RDW: 13.8 % (ref 11.5–14.6)
WBC: 10.3 10*3/uL (ref 4.5–10.5)

## 2012-05-01 LAB — IBC PANEL
Iron: 84 ug/dL (ref 42–165)
Saturation Ratios: 28.4 % (ref 20.0–50.0)
Transferrin: 211.5 mg/dL — ABNORMAL LOW (ref 212.0–360.0)

## 2012-05-01 LAB — COMPREHENSIVE METABOLIC PANEL
ALT: 36 U/L (ref 0–53)
AST: 31 U/L (ref 0–37)
Albumin: 4.1 g/dL (ref 3.5–5.2)
Alkaline Phosphatase: 48 U/L (ref 39–117)
BUN: 22 mg/dL (ref 6–23)
CO2: 30 mEq/L (ref 19–32)
Calcium: 9.1 mg/dL (ref 8.4–10.5)
Chloride: 98 mEq/L (ref 96–112)
Creatinine, Ser: 1.7 mg/dL — ABNORMAL HIGH (ref 0.4–1.5)
GFR: 41.58 mL/min — ABNORMAL LOW (ref >60.00)
Glucose, Bld: 179 mg/dL — ABNORMAL HIGH (ref 70–99)
Potassium: 4.2 mEq/L (ref 3.5–5.1)
Sodium: 135 mEq/L (ref 135–145)
Total Bilirubin: 0.8 mg/dL (ref 0.3–1.2)
Total Protein: 7.5 g/dL (ref 6.0–8.3)

## 2012-05-01 LAB — TSH: TSH: 0.9 u[IU]/mL (ref 0.35–5.50)

## 2012-05-01 LAB — FERRITIN: Ferritin: 98.2 ng/mL (ref 22.0–322.0)

## 2012-05-01 LAB — FOLATE: Folate: 17 ng/mL (ref >5.9)

## 2012-05-01 LAB — VITAMIN B12: Vitamin B-12: 360 pg/mL (ref 211–911)

## 2012-05-01 NOTE — Patient Instructions (Signed)
Your physician has requested that you go to the basement for  lab work before leaving today.   

## 2012-05-01 NOTE — Progress Notes (Signed)
This is a very nice 78 year old Caucasian male with chronic atrial fibrillation, aortic insufficiency, who is chronically anticoagulated and followed by Dr. Peter Swaziland in cardiology and Dr. Jarome Matin in primary care.  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  He is on chronic PPI therapy and denies any GI symptomatology.  2 years ago he also had a negative colonoscopy, then at a previous exam in 2006.  Currently has regular bowel movements and denies hematochezia, but has dark stools apparently related to her in therapy.  He is on multiple cardiac medications listed and reviewed.  He denies anorexia, weight loss, nausea vomiting, any systemic or hepatobiliary complaints.  Review of his labs is incomplete.  Current Medications, Allergies, Past Medical History, Past Surgical History, Family History and Social History were reviewed in [employer] record.  Pertinent Review of Systems Negative... previous cardioversion and he is on Amidarone the last year and has converted to normal sinus rhythm.  He denies chest pain, shortness of breath, but does have sleep apnea and uses CPAP machine.  He denies any bleeding complications from Coumadin.   Physical Exam: Healthy-appearing patient in no distress.  Blood pressure 142/60, pulse 60 and regular, and weight 220 pounds with a BMI of 31.21.  Resting oxygen saturation 97%.  I cannot appreciate stigmata of chronic liver disease.  His chest is clear he seems to be in a regular rhythm without significant murmurs gallops or rubs.  He has somewhat protuberant abdomen without any definite organomegaly, masses or tenderness.  Bowel sounds are active.  There is +1 peripheral edema with some stasis dermatitis.  Inspection of rectum shows a posterior skin tag, digital exam shows some mild rectal stenosis,  but just do it is dark but is guaiac negative.  Mental status is normal.  Throughout the exam and interview his wife was present.    Assessment and Plan: Chronic GERD and a patient with known Barrett's mucosa who is maintained on daily PPI therapy.  He has had a negative colonoscopy twice within the last 5 years, and denies  lower gastrointestinal problems.  He is at high risk for any endoscopic procedure with his atrial fibrillation, anticoagulation, and sleep apnea.  I have asked him to return IFOB stool cards for occult blood, and our repeat his CBC and anemia profile.  If his cards are positive, we will proceed with pill camera exam of his gut.  He may have gut vascular telangiectasias associated with his cardiovascular problems.  He is to continue his anti-reflux regime and daily PPI therapy.  I cannot find any records concerning his previous gastric surgery, but there was no evidence of any gastric mass was most recent endoscopic exam.  They should also denies abuse of alcohol, cigarettes, or NSAIDs.  He is currently at the [facility] home where he lives with his wife, eats well, and is gaining weight. Encounter Diagnosis  Name Primary?  . Guaiac + stool Yes

## 2012-05-05 ENCOUNTER — Other Ambulatory Visit (INDEPENDENT_AMBULATORY_CARE_PROVIDER_SITE_OTHER): Payer: Medicare Other

## 2012-05-05 DIAGNOSIS — R195 Other fecal abnormalities: Secondary | ICD-10-CM

## 2012-05-05 LAB — FECAL OCCULT BLOOD, IMMUNOCHEMICAL: Fecal Occult Bld: NEGATIVE

## 2012-09-16 ENCOUNTER — Encounter: Payer: Self-pay | Admitting: Cardiology

## 2012-10-14 ENCOUNTER — Ambulatory Visit (INDEPENDENT_AMBULATORY_CARE_PROVIDER_SITE_OTHER): Payer: Medicare Other | Admitting: Neurology

## 2012-10-14 ENCOUNTER — Encounter: Payer: Self-pay | Admitting: Neurology

## 2012-10-14 VITALS — BP 146/64 | HR 61 | Temp 98.3°F | Ht 70.0 in | Wt 215.0 lb

## 2012-10-14 DIAGNOSIS — R351 Nocturia: Secondary | ICD-10-CM | POA: Insufficient documentation

## 2012-10-14 DIAGNOSIS — Z9989 Dependence on other enabling machines and devices: Secondary | ICD-10-CM | POA: Insufficient documentation

## 2012-10-14 DIAGNOSIS — A4902 Methicillin resistant Staphylococcus aureus infection, unspecified site: Secondary | ICD-10-CM

## 2012-10-14 DIAGNOSIS — G4733 Obstructive sleep apnea (adult) (pediatric): Secondary | ICD-10-CM

## 2012-10-14 NOTE — Progress Notes (Signed)
[name] is a  established  patient in our sleep clinic and was seen for a sleep consult on 10/14/2011.  This patient of Dr. Jarold Motto used to have nocturia 5 times at night and also had complained of loud, severe snoring. He was directly referred for sleep study by his primary care physician and tested positive for severe obstructive sleep apnea.  He had an AHIL 48 and RDI over 60 per hour. His sleep study was titrated to 7 cm water.  He is using a soft nasal mask that he likes and has residual AHI is now 1.8. He is followed by [facility] his medical equipment company and is the [place] compliance. He had no significant air leakage in his last downloaded his use at home was over 10 hours. Nocturia is improved to once or twice at night. This used to break the patient went to the beach where he became very sick. He was diagnosed with MRSA infection in the  ethmoidal sinuses. He was in hospitalized to receive the necessary antibiotics and doing that. Was unable to use his CPAP machine. This download in the office shows he an apnea hypoxia index again of 1.8 and a CPAP setting of 7 cm the average usage although the last year has been 9 hours and 6 minutes per night. The patient is still considered 100% compliant. The patient has a past medical history of hypertension, diabetes mellitus and recently started insulin in February of this year, cholesterolemia and atrial fib fibrillation or flutter.  Surgical history gallbladder surgery, prostate surgery, stomach tumor removal, hiatal hernia, enucleation  of the left eye, carpal tunnel surgery, rotator cuff spur surgery, tonsillectomy.  [facility]  Provider:  Dr Tomothy Eddins Referring Provider: Jarome Matin, MD Primary Care Physician:  Garlan Fillers, MD  Chief Complaint  Patient presents with  . Follow-up    HPI:  Anthony Ho is a 79 y.o. male here as a referral from Dr. Eloise Harman for CPAP compliance .  Review of  Systems: Out of a complete 14 system review, the patient complains of only the following symptoms, and all other reviewed systems are negative. Recent sinusitis with MRSA , recent starting on  Insulin.   History   Social History  . Marital Status: Married    Spouse Name: N/A    Number of Children: N/A  . Years of Education: N/A   Occupational History  . Not on file.   Social History Main Topics  . Smoking status: Current Every Day Smoker    Types: Cigars  . Smokeless tobacco: Never Used  . Alcohol Use: No  . Drug Use: No  . Sexually Active: Not on file   Other Topics Concern  . Not on file   Social History Narrative  . No narrative on file    Family History  Problem Relation Age of Onset  . Heart failure Mother   . Atrial fibrillation Mother   . Heart attack Father     X2  . Heart attack Sister 51    Past Medical History  Diagnosis Date  . Diabetes mellitus   . Hypertension   . Hyperlipidemia   . Atrial fibrillation   . Barrett esophagus   . Hiatal hernia   . Sleep apnea   . Osteopenia   . Dyspnea   . Aortic insufficiency   . Dizziness   . Chronic anticoagulation   . GERD (gastroesophageal reflux disease)     Past Surgical  History  Procedure Laterality Date  . Cardioversion  2004  . Prostate surgery    . Bladder surgery    . Carpal tunnel release    . Shoulder arthroscopy      RIGHT SHOULDER  . Tumor removal from stomach    . Eye surgery      MULTIPLE WITH ENUCLEATION ON THE LEFT  . Transthoracic echocardiogram  04/19/2010    EF 55-60%  . US echocardiography  03/31/2003    EF 60-65%  . Cardiovascular stress test  04/19/2010    EF 72%    Current Outpatient Prescriptions  Medication Sig Dispense Refill  . alendronate (FOSAMAX) 70 MG tablet Take 70 mg by mouth every 7 (seven) days. Take with a full glass of water on an empty stomach.       Marland Kitchen amiodarone (PACERONE) 100 MG tablet Take 100 mg by mouth daily.        Marland Kitchen amLODipine (NORVASC) 5 MG  tablet Take 5 mg by mouth daily.        . calcium carbonate (OS-CAL) 600 MG TABS Take 600 mg by mouth daily.        . Calcium Carbonate-Vitamin D (CALCIUM + D PO) Take 1,200 mg by mouth.      . Cholecalciferol (VITAMIN D) 2000 UNITS tablet Take 2,000 Units by mouth daily.        Marland Kitchen CINNAMON PO Take 1,000 mg by mouth daily.        . ferrous sulfate 325 (65 FE) MG tablet Take 325 mg by mouth daily with breakfast.      . furosemide (LASIX) 40 MG tablet Take 40 mg by mouth daily.        . hydrOXYzine (ATARAX/VISTARIL) 25 MG tablet Take 25 mg by mouth daily. At night      . linagliptin (TRADJENTA) 5 MG TABS tablet Take 5 mg by mouth at bedtime.       Marland Kitchen losartan (COZAAR) 100 MG tablet Take 100 mg by mouth daily.      . mometasone (ELOCON) 0.1 % cream Apply topically daily.      . NON FORMULARY CPAP at night      . pantoprazole (PROTONIX) 40 MG tablet Take 40 mg by mouth daily.        . Potassium Chloride (KLOR-CON PO) Take 25 mg by mouth. Daily,morning      . pravastatin (PRAVACHOL) 40 MG tablet Take 40 mg by mouth daily.        . Tamsulosin HCl (FLOMAX) 0.4 MG CAPS Take 0.4 mg by mouth daily.        Marland Kitchen VITAMIN D, ERGOCALCIFEROL, PO Take by mouth. D3, daily in morning      . warfarin (COUMADIN) 3 MG tablet Take 3 mg by mouth daily.        No current facility-administered medications for this visit.    Allergies as of 10/14/2012  . (No Known Allergies)    Vitals: BP 146/64  Pulse 61  Temp(Src) 98.3 F (36.8 C)  Ht 5\' 10"  (1.778 m)  Wt 215 lb (97.523 kg)  BMI 30.85 kg/m2 Last Weight:  Wt Readings from Last 1 Encounters:  10/14/12 215 lb (97.523 kg)   Last Height:   Ht Readings from Last 1 Encounters:  10/14/12 5\' 10"  (1.778 m)     Physical exam:  General: The patient is awake, alert and appears not in acute distress. The patient is well groomed. Head: Normocephalic, atraumatic. Neck is supple. Mallampati 2,  neck circumference:16 inches. Cardiovascular:  irregular rate and rhythm  without carotid bruit,  Possible haert valve insufficiency , without distended neck veins. Respiratory: Lungs are clear to auscultation. Skin:  Without evidence of edema, or rash Trunk: BMI is elevated and patient  has normal posture.  Neurologic exam : The patient is awake and alert, oriented to place and time.  Memory subjective  described as intact. There is a normal attention span & concentration ability. Speech is fluent without  dysarthria, dysphonia or aphasia. Mood and affect are appropriate.  Cranial nerves: Pupil on the right   briskly reactive to light..Hearing to finger rub intact.  Facial sensation intact to fine touch. Facial motor strength is symmetric and tongue and uvula move midline.  Motor exam:   Normal tone and normal muscle bulk and symmetric normal strength in all extremities.  Sensory:  Fine touch, pinprick and vibration were tested in all extremities. Proprioception is tested in the upper extremities only. This was  normal.  Coordination: Rapid alternating movements in the fingers/hands is tested and normal. Finger-to-nose maneuver tested and normal without evidence of ataxia, dysmetria or tremor.  Gait and station: Patient walks without assistive device and is able and assisted stool climb up to the exam table. Strength within normal limits. Stance is stable and normal. Tandem gait is stable , turns with  Steps are unfragmented. Romberg testing is normal.  Deep tendon reflexes: in the  upper and lower extremities are symmetric and intact. Babinski maneuver response is  downgoing.   Assessment:  After physical and neurologic examination, review of laboratory studies, imaging, neurophysiology testing and pre-existing records, assessment will be reviewed on the problem list.  Plan:  Treatment plan and additional workup will be reviewed under Problem List.

## 2012-10-14 NOTE — Assessment & Plan Note (Signed)
The patient's CPAP settings need according to my download no adjustments, and the acquired MRSA he has changed the water reservoir filter tubing and mask. He is using a nasal antibiotic gel application by Q-tip for the MRSA infection. The patient can resume using his CPAP nightly as before. He does not have a recurrence of nocturia, snoring or poor sleep quality. Epworth sleepiness score 8 points and lower than prior to his CPAP treatment.

## 2012-10-14 NOTE — Patient Instructions (Addendum)
MRSA Overview MRSA stands for methicillin-resistant Staphylococcus aureus. It is a type of bacteria that is resistant to some common antibiotics. It can cause infections in the skin and many other places in the body. Staphylococcus aureus, often called "staph," is a bacteria that normally lives on the skin or in the nose. Staph on the surface of the skin or in the nose does not cause problems. However, if the staph enters the body through a cut, wound, or break in the skin, an infection can happen. Up until recently, infections with the MRSA type of staph mainly occurred in hospitals and other healthcare settings. There are now increasing problems with MRSA infections in the community as well. Infections with MRSA may be very serious or even life-threatening. Most MRSA infections are acquired in one of two ways:  Healthcare-associated MRSA (HA-MRSA)  This can be acquired by people in any healthcare setting. MRSA can be a big problem for hospitalized people, people in nursing homes, people in rehabilitation facilities, people with weakened immune systems, dialysis patients, and those who have had surgery.  Community-associated MRSA (CA-MRSA)  Community spread of MRSA is becoming more common. It is known to spread in crowded settings, in jails and prisons, and in situations where there is close skin-to-skin contact, such as during sporting events or in locker rooms. MRSA can be spread through shared items, such as children's toys, razors, towels, or sports equipment. CAUSES  All staph, including MRSA, are normally harmless unless they enter the body through a scratch, cut, or wound, such as with surgery. All staph, including MRSA, can be spread from person-to-person by touching contaminated objects or through direct contact. SPECIAL GROUPS MRSA can present problems for special groups of people. Some of these groups include:  Breastfeeding women.  The most common problem is MRSA infection of the  breast (mastitis). There is evidence that MRSA can be passed to an infant from infected breast milk. Your caregiver may recommend that you stop breastfeeding until the mastitis is under control.  If you are breastfeeding and have a MRSA infection in a place other than the breast, you may usually continue breastfeeding while under treatment. If taking antibiotics, ask your caregiver if it is safe to continue breastfeeding while taking your prescribed medicines.  Neonates (babies from birth to 51 month old) and infants (babies from 1 month to 24 year old).  There is evidence that MRSA can be passed to a newborn at birth if the mother has MRSA on the skin, in or around the birth canal, or an infection in the uterus, cervix, or vagina. MRSA infection can have the same appearance as a normal newborn or infant rash or several other skin infections. This can make it hard to diagnose MRSA.  Immune compromised people.  If you have an immune system problem, you may have a higher chance of developing a MRSA infection.  People after any type of surgery.  Staph in general, including MRSA, is the most common cause of infections occurring at the site of recent surgery.  People on long-term steroid medicines.  These kinds of medicines can lower your resistance to infection. This can increase your chance of getting MRSA.  People who have had frequent hospitalizations, live in nursing homes or other residential care facilities, have venous or urinary catheters, or have taken multiple courses of antibiotic therapy for any reason. DIAGNOSIS  Diagnosis of MRSA is done by cultures of fluid samples that may come from:  Swabs taken from cuts or  wounds in infected areas.  Nasal swabs.  Saliva or deep cough specimens from the lungs (sputum).  Urine.  Blood. Many people are "colonized" with MRSA but have no signs of infection. This means that people carry the MRSA germ on their skin or in their nose and may  never develop MRSA infection.  TREATMENT  Treatment varies and is based on how serious, how deep, or how extensive the infection is. For example:  Some skin infections, such as a small boil or abscess, may be treated by draining yellowish-white fluid (pus) from the site of the infection.  Deeper or more widespread soft tissue infections are usually treated with surgery to drain pus and with antibiotic medicine given by vein or by mouth. This may be recommended even if you are pregnant.  Serious infections may require a hospital stay. If antibiotics are given, they may be needed for several weeks. PREVENTION  Because many people are colonized with staph, including MRSA, preventing the spread of the bacteria from person-to-person is most important. The best way to prevent the spread of bacteria and other germs is through proper hand washing or by using alcohol-based hand disinfectants. The following are other ways to help prevent MRSA infection within the hospital and community settings.   Healthcare settings:  Strict hand washing or hand disinfection procedures need to be followed before and after touching every patient.  Patients infected with MRSA are placed in isolation to prevent the spread of the bacteria.  Healthcare workers need to wear disposable gowns and gloves when touching or caring for patients infected with MRSA. Visitors may also be asked to wear a gown and gloves.  Hospital surfaces need to be disinfected frequently.  Community settings:  [employer] frequently with soap and water for at least 15 seconds. Otherwise, use alcohol-based hand disinfectants when soap and water is not available.  Make sure people who live with you wash their hands often, too.  Do not share personal items. For example, avoid sharing razors and other personal hygiene items, towels, clothing, and athletic equipment.  Wash and dry your clothes and bedding at the warmest temperatures  recommended on the labels.  Keep wounds covered. Pus from infected sores may contain MRSA and other bacteria. Keep cuts and abrasions clean and covered with germ-free (sterile), dry bandages until they are healed.  If you have a wound that appears infected, ask your caregiver if a culture for MRSA and other bacteria should be done.  If you are breastfeeding, talk to your caregiver about MRSA. You may be asked to temporarily stop breastfeeding. HOME CARE INSTRUCTIONS   Take your antibiotics as directed. Finish them even if you start to feel better.  Avoid close contact with those around you as much as possible. Do not use towels, razors, toothbrushes, bedding, or other items that will be used by others.  To fight the infection, follow your caregiver's instructions for wound care. Wash your hands before and after changing your bandages.  If you have an intravascular device, such as a catheter, make sure you know how to care for it.  Be sure to tell any healthcare providers that you have MRSA so they are aware of your infection. SEEK IMMEDIATE MEDICAL CARE IF:   The infection appears to be getting worse. Signs include:  Increased warmth, redness, or tenderness around the wound site.  A red line that extends from the infection site.  A dark color in the area around the infection.  Wound drainage  that is tan, yellow, or green.  A bad smell coming from the wound.  You feel sick to your stomach (nauseous) and throw up (vomit) or cannot keep medicine down.  You have a fever.  Your baby is older than 3 months with a rectal temperature of 102 F (38.9 C) or higher.  Your baby is 100 months old or younger with a rectal temperature of 100.4 F (38 C) or higher.  You have difficulty breathing. MAKE SURE YOU:   Understand these instructions.  Will watch your condition.  Will get help right away if you are not doing well or get worse. Document Released: 06/03/2005 Document Revised:  08/26/2011 Document Reviewed: 09/05/2010 [facility] Patient Information 2013 [place], [place]. Sleep Apnea  Sleep apnea is a sleep disorder characterized by abnormal pauses in breathing while you sleep. When your breathing pauses, the level of oxygen in your blood decreases. This causes you to move out of deep sleep and into light sleep. As a result, your quality of sleep is poor, and the system that carries your blood throughout your body (cardiovascular system) experiences stress. If sleep apnea remains untreated, the following conditions can develop:  High blood pressure (hypertension).  Coronary artery disease.  Inability to achieve or maintain an erection (impotence).  Impairment of your thought process (cognitive dysfunction). There are three types of sleep apnea: 1. Obstructive sleep apnea Pauses in breathing during sleep because of a blocked airway. 2. Central sleep apnea Pauses in breathing during sleep because the area of the brain that controls your breathing does not send the correct signals to the muscles that control breathing. 3. Mixed sleep apnea A combination of both obstructive and central sleep apnea. RISK FACTORS The following risk factors can increase your risk of developing sleep apnea:  Being overweight.  Smoking.  Having narrow passages in your nose and throat.  Being of older age.  Being male.  Alcohol use.  Sedative and tranquilizer use.  Ethnicity. Among individuals younger than 35 years, African Americans are at increased risk of sleep apnea. SYMPTOMS   Difficulty staying asleep.  Daytime sleepiness and fatigue.  Loss of energy.  Irritability.  Loud, heavy snoring.  Morning headaches.  Trouble concentrating.  Forgetfulness.  Decreased interest in sex. DIAGNOSIS  In order to diagnose sleep apnea, your caregiver will perform a physical examination. Your caregiver may suggest that you take a home sleep test. Your caregiver may also recommend  that you spend the night in a sleep lab. In the sleep lab, several monitors record information about your heart, lungs, and brain while you sleep. Your leg and arm movements and blood oxygen level are also recorded. TREATMENT The following actions may help to resolve mild sleep apnea:  Sleeping on your side.   Using a decongestant if you have nasal congestion.   Avoiding the use of depressants, including alcohol, sedatives, and narcotics.   Losing weight and modifying your diet if you are overweight. There also are devices and treatments to help open your airway:  Oral appliances. These are custom-made mouthpieces that shift your lower jaw forward and slightly open your bite. This opens your airway.  Devices that create positive airway pressure. This positive pressure "splints" your airway open to help you breathe better during sleep. The following devices create positive airway pressure:  Continuous positive airway pressure (CPAP) device. The CPAP device creates a continuous level of air pressure with an air pump. The air is delivered to your airway through a mask while you  sleep. This continuous pressure keeps your airway open.  Nasal expiratory positive airway pressure (EPAP) device. The EPAP device creates positive air pressure as you exhale. The device consists of single-use valves, which are inserted into each nostril and held in place by adhesive. The valves create very little resistance when you inhale but create much more resistance when you exhale. That increased resistance creates the positive airway pressure. This positive pressure while you exhale keeps your airway open, making it easier to breath when you inhale again.  Bilevel positive airway pressure (BPAP) device. The BPAP device is used mainly in patients with central sleep apnea. This device is similar to the CPAP device because it also uses an air pump to deliver continuous air pressure through a mask. However, with the  BPAP machine, the pressure is set at two different levels. The pressure when you exhale is lower than the pressure when you inhale.  Surgery. Typically, surgery is only done if you cannot comply with less invasive treatments or if the less invasive treatments do not improve your condition. Surgery involves removing excess tissue in your airway to create a wider passage way. Document Released: 05/24/2002 Document Revised: 12/03/2011 Document Reviewed: 10/10/2011 [facility] Patient Information 2013 [place], [place]. Atrial Fibrillation Your caregiver has diagnosed you with atrial fibrillation (AFib). The heart normally beats very regularly; AFib is a type of irregular heartbeat. The heart rate may be faster or slower than normal. This can prevent your heart from pumping as well as it should. AFib can be constant (chronic) or intermittent (paroxysmal). CAUSES  Atrial fibrillation may be caused by:  Heart disease, including heart attack, coronary artery disease, heart failure, diseases of the heart valves, and others.  Blood clot in the lungs (pulmonary embolism).  Pneumonia or other infections.  Chronic lung disease.  Thyroid disease.  Toxins. These include alcohol, some medications (such as decongestant medications or diet pills), and caffeine. In some people, no cause for AFib can be found. This is referred to as Lone Atrial Fibrillation. SYMPTOMS   Palpitations or a fluttering in your chest.  A vague sense of chest discomfort.  Shortness of breath.  Sudden onset of lightheadedness or weakness. Sometimes, the first sign of AFib can be a complication of the condition. This could be a stroke or heart failure. DIAGNOSIS  Your description of your condition may make your caregiver suspicious of atrial fibrillation. Your caregiver will examine your pulse to determine if fibrillation is present. An EKG (electrocardiogram) will confirm the diagnosis. Further testing may help determine what  caused you to have atrial fibrillation. This may include chest x-ray, echocardiogram, blood tests, or CT scans. PREVENTION  If you have previously had atrial fibrillation, your caregiver may advise you to avoid substances known to cause the condition (such as stimulant medications, and possibly caffeine or alcohol). You may be advised to use medications to prevent recurrence. Proper treatment of any underlying condition is important to help prevent recurrence. PROGNOSIS  Atrial fibrillation does tend to become a chronic condition over time. It can cause significant complications (see below). Atrial fibrillation is not usually immediately life-threatening, but it can shorten your life expectancy. This seems to be worse in women. If you have lone atrial fibrillation and are under 68 years old, the risk of complications is very low, and life expectancy is not shortened. RISKS AND COMPLICATIONS  Complications of atrial fibrillation can include stroke, chest pain, and heart failure. Your caregiver will recommend treatments for the atrial fibrillation, as well as  for any underlying conditions, to help minimize risk of complications. TREATMENT  Treatment for AFib is divided into several categories:  Treatment of any underlying condition.  Converting you out of AFib into a regular (sinus) rhythm.  Controlling rapid heart rate.  Prevention of blood clots and stroke. Medications and procedures are available to convert your atrial fibrillation to sinus rhythm. However, recent studies have shown that this may not offer you any advantage, and cardiac experts are continuing research and debate on this topic. More important is controlling your rapid heartbeat. The rapid heartbeat causes more symptoms, and places strain on your heart. Your caregiver will advise you on the use of medications that can control your heart rate. Atrial fibrillation is a strong stroke risk. You can lessen this risk by taking blood  thinning medications such as Coumadin (warfarin), or sometimes aspirin. These medications need close monitoring by your caregiver. Over-medication can cause bleeding. Too little medication may not protect against stroke. HOME CARE INSTRUCTIONS   If your caregiver prescribed medicine to make your heartbeat more normally, take as directed.  If blood thinners were prescribed by your caregiver, take EXACTLY as directed.  Perform blood tests EXACTLY as directed.  Quit smoking. Smoking increases your cardiac and lung (pulmonary) risks.  DO NOT drink alcohol.  DO NOT drink caffeinated drinks (e.g. coffee, soda, chocolate, and leaf teas). You may drink decaffeinated coffee, soda or tea.  If you are overweight, you should choose a reduced calorie diet to lose weight. Please see a registered dietitian if you need more information about healthy weight loss. DO NOT USE DIET PILLS as they may aggravate heart problems.  If you have other heart problems that are causing AFib, you may need to eat a low salt, fat, and cholesterol diet. Your caregiver will tell you if this is necessary.  Exercise every day to improve your physical fitness. Stay active unless advised otherwise.  If your caregiver has given you a follow-up appointment, it is very important to keep that appointment. Not keeping the appointment could result in heart failure or stroke. If there is any problem keeping the appointment, you must call back to this facility for assistance. SEEK MEDICAL CARE IF:  You notice a change in the rate, rhythm or strength of your heartbeat.  You develop an infection or any other change in your overall health status. SEEK IMMEDIATE MEDICAL CARE IF:   You develop chest pain, abdominal pain, sweating, weakness or feel sick to your stomach (nausea).  You develop shortness of breath.  You develop swollen feet and ankles.  You develop dizziness, numbness, or weakness of your face or limbs, or any change in  vision or speech. MAKE SURE YOU:   Understand these instructions.  Will watch your condition.  Will get help right away if you are not doing well or get worse. Document Released: 06/03/2005 Document Revised: 08/26/2011 Document Reviewed: 01/06/2008 [facility] Patient Information 2013 [place], [place].

## 2012-10-16 ENCOUNTER — Encounter: Payer: Self-pay | Admitting: Cardiology

## 2012-10-21 ENCOUNTER — Encounter: Payer: Self-pay | Admitting: Neurology

## 2012-11-02 ENCOUNTER — Ambulatory Visit (INDEPENDENT_AMBULATORY_CARE_PROVIDER_SITE_OTHER): Payer: Medicare Other | Admitting: Cardiology

## 2012-11-02 ENCOUNTER — Encounter: Payer: Self-pay | Admitting: Cardiology

## 2012-11-02 VITALS — BP 136/52 | HR 51 | Ht 70.0 in | Wt 212.1 lb

## 2012-11-02 DIAGNOSIS — I4891 Unspecified atrial fibrillation: Secondary | ICD-10-CM

## 2012-11-02 DIAGNOSIS — I10 Essential (primary) hypertension: Secondary | ICD-10-CM

## 2012-11-02 NOTE — Progress Notes (Signed)
Rosezetta Schlatter Date of Birth: Feb 23, 1932 Medical Record #[id number]  History of Present Illness: Mr. Bontempo is seen for  followup. He has been doing very well from a cardiac standpoint. He denies any chest pain, shortness of breath, or edema. He denies any recurrent palpitations on amiodarone. He is now on insulin therapy with an improvement in his A1c from 8.6-6.5%. He has lost 8 pounds.  Current Outpatient Prescriptions on File Prior to Visit  Medication Sig Dispense Refill  . alendronate (FOSAMAX) 70 MG tablet Take 70 mg by mouth every 7 (seven) days. Take with a full glass of water on an empty stomach.       Marland Kitchen amiodarone (PACERONE) 100 MG tablet Take 100 mg by mouth daily.        Marland Kitchen amLODipine (NORVASC) 5 MG tablet Take 5 mg by mouth daily.        . calcium carbonate (OS-CAL) 600 MG TABS Take 600 mg by mouth daily.        . Calcium Carbonate-Vitamin D (CALCIUM + D PO) Take 1,200 mg by mouth.      Marland Kitchen CINNAMON PO Take 1,000 mg by mouth daily.        . ferrous sulfate 325 (65 FE) MG tablet Take 325 mg by mouth daily with breakfast.      . furosemide (LASIX) 40 MG tablet Take 40 mg by mouth daily.        . hydrOXYzine (ATARAX/VISTARIL) 25 MG tablet Take 25 mg by mouth daily. At night      . linagliptin (TRADJENTA) 5 MG TABS tablet Take 5 mg by mouth at bedtime.       Marland Kitchen losartan (COZAAR) 100 MG tablet Take 100 mg by mouth daily.      . mometasone (ELOCON) 0.1 % cream Apply topically daily.      . NON FORMULARY CPAP at night      . pantoprazole (PROTONIX) 40 MG tablet Take 40 mg by mouth daily.        . Potassium Chloride (KLOR-CON PO) Take 25 mg by mouth. Daily,morning      . pravastatin (PRAVACHOL) 40 MG tablet Take 40 mg by mouth daily.        . Tamsulosin HCl (FLOMAX) 0.4 MG CAPS Take 0.4 mg by mouth daily.        Marland Kitchen VITAMIN D, ERGOCALCIFEROL, PO Take 2,000 Units by mouth. D3, daily in morning      . warfarin (COUMADIN) 3 MG tablet Take 3 mg by mouth daily.        No current  facility-administered medications on file prior to visit.    No Known Allergies  Past Medical History  Diagnosis Date  . Diabetes mellitus   . Hypertension   . Hyperlipidemia   . Atrial fibrillation   . Barrett esophagus   . Hiatal hernia   . Sleep apnea   . Osteopenia   . Dyspnea   . Aortic insufficiency   . Dizziness   . Chronic anticoagulation   . GERD (gastroesophageal reflux disease)     Past Surgical History  Procedure Laterality Date  . Cardioversion  2004  . Prostate surgery    . Bladder surgery    . Carpal tunnel release    . Shoulder arthroscopy      RIGHT SHOULDER  . Tumor removal from stomach    . Eye surgery      MULTIPLE WITH ENUCLEATION ON THE LEFT  . Transthoracic echocardiogram  04/19/2010  EF 55-60%  . US echocardiography  03/31/2003    EF 60-65%  . Cardiovascular stress test  04/19/2010    EF 72%    History  Smoking status  . Current Every Day Smoker  . Types: Cigars  Smokeless tobacco  . Never Used    History  Alcohol Use No    Family History  Problem Relation Age of Onset  . Heart failure Mother   . Atrial fibrillation Mother   . Heart attack Father     X2  . Heart attack Sister 73    Review of Systems: As noted in history of present illness.   All other systems were reviewed and are negative.  Physical Exam: BP 136/52  Pulse 51  Ht 5\' 10"  (1.778 m)  Wt 212 lb 1.9 oz (96.217 kg)  BMI 30.44 kg/m2  SpO2 95% The patient is alert and oriented x 3.  The mood and affect are normal.  The skin is warm and dry.  Color is normal.  The HEENT exam reveals that the sclera are nonicteric.  The mucous membranes are moist.  The carotids are 2+ without bruits.  There is no thyromegaly.  There is no JVD.  The lungs are clear.  The chest wall is non tender.  The heart exam reveals a regular rate with a normal S1 and S2.  There are no murmurs, gallops, or rubs.  The PMI is not displaced.   Abdominal exam reveals good bowel sounds.  There is  no guarding or rebound.  There is no hepatosplenomegaly or tenderness.  There are no masses.  Exam of the legs reveal 1+ edema.  The legs are without rashes.  The distal pulses are intact.  Cranial nerves II - XII are intact.  Motor and sensory functions are intact.  The gait is normal.  LABORATORY DATA:   Assessment / Plan: 1. Atrial fibrillation. He appears to be maintaining sinus rhythm well on low-dose amiodarone. I'll followup again in 6 months and we'll check ECG, liver function studies, and thyroid study at that time.  2. Hypertension, controlled.  3. Diabetes mellitus now on insulin with improved control.

## 2012-11-02 NOTE — Patient Instructions (Addendum)
Continue your current therapy  I will see you in 6 months with lab work and an Medical illustrator.

## 2012-12-29 ENCOUNTER — Telehealth: Payer: Self-pay | Admitting: Cardiology

## 2012-12-29 NOTE — Telephone Encounter (Signed)
Spoke with pt's wife.  He had surgery on his eye yesterday.  He held Coumadin x 3 days prior to the procedure.  The eye surgeon wants him to hold Coumadin x 3 days afterwards.  Wife spoke with Dr. Silvano Rusk office (they follow [facility]).  Dr. Eloise Harman suggests pt restart Coumadin today.  Wife just wanted to make sure that was okay from cardiology standpoint.  Agreed that okay to start today.    Wife also concerned over pt's HR and BP.  BP- 138/57 and HR 44 today.  States his HR is usually 60.  He has been taking hydrocodone for pain since procedure.  This is likely culprit of low HR.  Asked wife to monitor HR and symptoms.  If it continues to decrease or pt starts to get dizzy or lightheaded, will need to call back to office.  Also asked to limit hydrocodone to only what patient needs to keep pain tolerable.

## 2012-12-29 NOTE — Telephone Encounter (Signed)
New problem   Pt has question regarding pts coumadin

## 2013-01-13 ENCOUNTER — Encounter: Payer: Self-pay | Admitting: *Deleted

## 2013-01-13 ENCOUNTER — Encounter: Payer: Medicare Other | Attending: Internal Medicine | Admitting: *Deleted

## 2013-01-13 VITALS — Ht 70.0 in | Wt 217.6 lb

## 2013-01-13 DIAGNOSIS — Z713 Dietary counseling and surveillance: Secondary | ICD-10-CM | POA: Insufficient documentation

## 2013-01-13 DIAGNOSIS — E119 Type 2 diabetes mellitus without complications: Secondary | ICD-10-CM | POA: Insufficient documentation

## 2013-01-13 NOTE — Progress Notes (Signed)
Appt start time: 0930 end time:  1030.   Assessment:  Patient was seen on  01/13/13 for individual diabetes education.  He was diagnosed with Type 2 DM about 10 years ago and has not has diabetes education.  He is now insulin dependent.  He monitors his glucose every morning and tries to check it before supper.  His fasting glucose runs 140-150 mg/dl.  The highest reading he noticed was 176 mg/dl and his lowest is 161 mg/dl The couple lives in a retirement community and most meals are served in [facility] [place].  This facility does not have specific "diabetes" meals.  This facility also has large meals with several courses and Anthony Ho admits he has gained some weight.  They do buy snacks and desserts for themselves, which [facility] enjoys.    Current HbA1c: 6.5% in June.    MEDICATIONS: see list   DIETARY INTAKE:  Usual eating pattern includes 3 meals and 2 snacks per day.  Everyday foods include proteins, starches, fruits, and vegetables.  Avoided foods include none.    24-hr recall:  B ( AM): cereal (special k vanilla almond) with 2% milk.  Also has fruit (peaches or strawberries or blueberries).  Cup of coffee with tad of 2% milk and sweet and low.  Sometimes yogurt; seldom eggs, bacon, toast.  Snk ( AM): none  L ( PM): meat, starch, vegetable in dining hall Snk ( PM): mixed drinks (bourbon with diet soda) and nuts or cheezits D ( PM): tomato sandwich on white bread and mayo with maybe chips.  Sometimes goes to [employer]- not often Snk ( PM): dessert Beverages: unsweet tea with sweet-n-low, bourbon with soda- not much other alcohol, diet soda  Usual physical activity: goes to fitness center in mornings sometimes: 1/2 mile on treadmill and then lift weights  Estimated energy needs: 1800-2000 calories 200 g carbohydrates 135 g protein 50 g fat  Progress Towards Goal(s):  In progress.   Nutritional Diagnosis:  NB-1.1 Food and nutrition-related knowledge deficit As related to carbohydrate  containing foods and proper balance of fats, carbs, and proteins.  As evidenced by obesity and diabetes.    Intervention:  Nutrition counseling provided.  Discussed diabetes disease process and treatment options.  Discussed physiology of diabetes and role of obesity on insulin resistance.  Encouraged moderate weight reduction to improve glucose levels.  Discussed role of medications and diet in glucose control  Provided education on macronutrients on glucose levels.  Provided education on carb counting, importance of regularly scheduled meals/snacks, and meal planning  Discussed effects of physical activity on glucose levels and long-term glucose control.  Recommended 150 minutes of physical activity/week.  Reviewed patient medications.  Discussed role of medication on blood glucose and possible side effects  Discussed blood glucose monitoring and interpretation.  Discussed recommended target ranges and individual ranges.     Handouts given during visit include: Living Well with Diabetes Carb Counting and Food Label handouts Meal Plan Card  Barriers to learning/adherance to lifestyle change: remembering insulin, remembering exercise, limiting portions  Diabetes self-care support plan: wife  Monitoring/Evaluation:  Dietary intake, exercise, HbA1c, and body weight in 3 month(s).

## 2013-01-13 NOTE — Patient Instructions (Signed)
Goals:  Follow Diabetes Meal Plan as instructed  Eat 3 meals and 2 snacks, every 3-5 hrs  Limit carbohydrate intake to 45-60 grams carbohydrate/meal  Limit carbohydrate intake to 15-30 grams carbohydrate/snack  Add lean protein foods to meals/snacks  Monitor glucose levels as instructed by your doctor  Aim for 30 mins of physical activity daily  Bring food record and glucose log to your next nutrition visit 

## 2013-01-21 ENCOUNTER — Other Ambulatory Visit: Payer: Self-pay | Admitting: *Deleted

## 2013-01-21 MED ORDER — AMIODARONE HCL 200 MG PO TABS: 100.0000 mg | ORAL_TABLET | Freq: Every day | ORAL | Status: DC

## 2013-01-21 NOTE — Telephone Encounter (Signed)
Returning patient's from refill line about Amiodarone 200mg , half tablet daily sent to [facility] on [address].

## 2013-04-15 ENCOUNTER — Ambulatory Visit: Payer: BLUE CROSS/BLUE SHIELD | Admitting: *Deleted

## 2013-04-16 ENCOUNTER — Ambulatory Visit: Payer: BLUE CROSS/BLUE SHIELD | Admitting: *Deleted

## 2013-04-19 ENCOUNTER — Encounter: Payer: Medicare Other | Attending: Internal Medicine | Admitting: *Deleted

## 2013-04-19 DIAGNOSIS — Z713 Dietary counseling and surveillance: Secondary | ICD-10-CM | POA: Insufficient documentation

## 2013-04-19 DIAGNOSIS — E119 Type 2 diabetes mellitus without complications: Secondary | ICD-10-CM | POA: Insufficient documentation

## 2013-04-19 NOTE — Progress Notes (Signed)
Appt start time: 0900 end time:  1000.   Assessment:  Patient was seen on  04/19/13 for follow up individual diabetes education.  [facility] is accompanied by his wife who does not believe he has made any changes.  He has not had his HbA1c checked since last visit and I encouraged him to schedule an appointment with his doctor.  He admits to overeating at breakfast and lunch.  His glucose log book reveals elevated glucose most of the time. He does not always remember to take his insulin and he seldom takes it at the same time of day.  Current HbA1c: 6.5% in June.    MEDICATIONS: see list   DIETARY INTAKE:  Usual eating pattern includes 3 meals and 2 snacks per day.  Everyday foods include proteins (eats fish a lot), starches, fruits, and vegetables.  Avoided foods include beef.    24-hr recall:  B ( AM): cereal (special k vanilla almond) with 2% milk.  Also has fruit (peaches or strawberries or blueberries).  Cup of coffee with tad of 2% milk and sweet and low.  Sometimes yogurt; seldom eggs, bacon, toast.  Snk ( AM): none  L ( PM): meat, starch, vegetable in dining hall Snk ( PM): mixed drinks (bourbon with diet soda) and nuts or cheezits D ( PM): tomato sandwich on white bread and mayo with maybe chips.  Sometimes goes to [employer]- not often Snk ( PM): dessert Beverages: unsweet tea with sweet-n-low, bourbon with soda- not much other alcohol, diet soda  Usual physical activity: goes to fitness center in mornings 4 days a week.  1/2 mile on treadmill and then lift weights  Estimated energy needs: 1800-2000 calories 200 g carbohydrates 135 g protein 50 g fat  Progress Towards Goal(s):  In progress.   Nutritional Diagnosis:  NB-1.1 Food and nutrition-related knowledge deficit As related to carbohydrate containing foods and proper balance of fats, carbs, and proteins.  As evidenced by obesity and diabetes.    Intervention:  Nutrition counseling provided.  Reviewed carb counting and  meal planning  Reviewed exercise guidelines  Discussed NNS   Discussed proper administration of medication   Handouts given during visit include: Living Well with Diabetes Meal Plan Card  Barriers to learning/adherance to lifestyle change: remembering insulin, remembering exercise, limiting portions  Diabetes self-care support plan: wife  Monitoring/Evaluation:  Dietary intake, exercise, HbA1c, and body weight in 3 month(s).

## 2013-05-05 ENCOUNTER — Ambulatory Visit (INDEPENDENT_AMBULATORY_CARE_PROVIDER_SITE_OTHER): Payer: Medicare Other | Admitting: Cardiology

## 2013-05-05 ENCOUNTER — Encounter: Payer: Self-pay | Admitting: Cardiology

## 2013-05-05 VITALS — BP 150/60 | HR 43 | Ht 70.0 in | Wt 216.8 lb

## 2013-05-05 DIAGNOSIS — I10 Essential (primary) hypertension: Secondary | ICD-10-CM

## 2013-05-05 DIAGNOSIS — I4891 Unspecified atrial fibrillation: Secondary | ICD-10-CM

## 2013-05-05 DIAGNOSIS — R001 Bradycardia, unspecified: Secondary | ICD-10-CM

## 2013-05-05 DIAGNOSIS — I498 Other specified cardiac arrhythmias: Secondary | ICD-10-CM

## 2013-05-05 HISTORY — DX: Bradycardia, unspecified: R00.1

## 2013-05-05 NOTE — Patient Instructions (Signed)
Stop taking amiodarone.  Continue your other medication  I will see you in 6 months.

## 2013-05-05 NOTE — Progress Notes (Signed)
Anthony Ho Date of Birth: 04-29-1932 Medical Record #[id number]  History of Present Illness: Anthony Ho is seen for  followup. He has a history of atrial fibrillation and underwent cardioversion in 2005. His atrial fibrillation recurred and he was placed on amiodarone which converted him  medically. He has been on amiodarone  time. He has been doing very well from a cardiac standpoint. He denies any chest pain, shortness of breath, or edema. He denies any recurrent palpitations on amiodarone. He has been working out in [employer] and has improved his exercise tolerance.  Current Outpatient Prescriptions on File Prior to Visit  Medication Sig Dispense Refill  . alendronate (FOSAMAX) 70 MG tablet Take 70 mg by mouth every 7 (seven) days. Take with a full glass of water on an empty stomach.       Marland Kitchen amLODipine (NORVASC) 5 MG tablet Take 5 mg by mouth daily.        . calcium carbonate (OS-CAL) 600 MG TABS Take 600 mg by mouth daily.        . Calcium Carbonate-Vitamin D (CALCIUM + D PO) Take 1,200 mg by mouth.      Marland Kitchen CINNAMON PO Take 1,000 mg by mouth daily.        . ferrous sulfate 325 (65 FE) MG tablet Take 325 mg by mouth daily with breakfast.      . furosemide (LASIX) 40 MG tablet Take 40 mg by mouth daily.        . hydrOXYzine (ATARAX/VISTARIL) 25 MG tablet Take 25 mg by mouth daily. At night      . INSULIN ASPART [place] Inject 16 Units into the skin.       Marland Kitchen linagliptin (TRADJENTA) 5 MG TABS tablet Take 5 mg by mouth at bedtime.       Marland Kitchen losartan (COZAAR) 100 MG tablet Take 100 mg by mouth daily.      . mometasone (ELOCON) 0.1 % cream Apply 1 application topically as needed.       . NON FORMULARY CPAP at night      . pantoprazole (PROTONIX) 40 MG tablet Take 40 mg by mouth daily.        . Potassium Chloride (KLOR-CON PO) Take 25 mg by mouth. Daily,morning      . pravastatin (PRAVACHOL) 40 MG tablet Take 40 mg by mouth daily.        . Tamsulosin HCl (FLOMAX) 0.4 MG CAPS Take 0.4 mg by mouth  daily.        Marland Kitchen VITAMIN D, ERGOCALCIFEROL, PO Take 2,000 Units by mouth. D3, daily in morning      . warfarin (COUMADIN) 3 MG tablet Take 3 mg by mouth daily.        No current facility-administered medications on file prior to visit.    No Known Allergies  Past Medical History  Diagnosis Date  . Diabetes mellitus   . Hypertension   . Hyperlipidemia   . Atrial fibrillation   . Barrett esophagus   . Hiatal hernia   . Sleep apnea   . Osteopenia   . Dyspnea   . Aortic insufficiency   . Dizziness   . Chronic anticoagulation   . GERD (gastroesophageal reflux disease)   . Sinus bradycardia 05/05/2013    Past Surgical History  Procedure Laterality Date  . Cardioversion  2004  . Prostate surgery    . Bladder surgery    . Carpal tunnel release    . Shoulder arthroscopy  RIGHT SHOULDER  . Tumor removal from stomach    . Eye surgery      MULTIPLE WITH ENUCLEATION ON THE LEFT  . Transthoracic echocardiogram  04/19/2010    EF 55-60%  . US echocardiography  03/31/2003    EF 60-65%  . Cardiovascular stress test  04/19/2010    EF 72%    History  Smoking status  . Current Every Day Smoker  . Types: Cigars  Smokeless tobacco  . Never Used    History  Alcohol Use No    Family History  Problem Relation Age of Onset  . Heart failure Mother   . Atrial fibrillation Mother   . Heart attack Father     X2  . Heart attack Sister 35    Review of Systems: As noted in history of present illness.   All other systems were reviewed and are negative.  Physical Exam: BP 150/60  Pulse 43  Ht 5\' 10"  (1.778 m)  Wt 216 lb 12.8 oz (98.34 kg)  BMI 31.11 kg/m2 He is a pleasant, elderly white male in no acute distress.  The HEENT exam is normal.  The carotids are 2+ without bruits.  There is no thyromegaly.  There is no JVD.  The lungs are clear.  The chest wall is non tender.  The heart exam reveals a regular rate with a normal S1 and S2.  There are no murmurs, gallops, or rubs.   The PMI is not displaced.   Abdominal exam reveals good bowel sounds.  There is no guarding or rebound.  There is no hepatosplenomegaly or tenderness.  There are no masses.  Exam of the legs reveal trace edema.  The legs are without rashes.  The distal pulses are intact.  Cranial nerves II - XII are intact.  Motor and sensory functions are intact.  The gait is normal.  LABORATORY DATA: ECG demonstrates marked sinus bradycardia with a rate of 43 beats per minute. It is otherwise normal.  Assessment / Plan: 1. Atrial fibrillation. He has maintained sinus rhythm with a past 9 years on amiodarone. Unfortunately now he has severe bradycardia. Although he is not significantly symptomatic I am concerned about continuing amiodarone in this setting. I recommended stopping amiodarone at this point. If he has recurrent atrial fibrillation we will need to consider alternative antiarrhythmic drug therapy or possibly even a pacemaker.  2. Hypertension, controlled.  3. Diabetes mellitus now on insulin with improved control.

## 2013-05-05 NOTE — Addendum Note (Signed)
Addended by: Johnston Ebbs on: 05/05/2013 01:50 PM   Modules accepted: Orders

## 2013-06-03 ENCOUNTER — Telehealth: Payer: Self-pay | Admitting: Cardiology

## 2013-06-03 NOTE — Telephone Encounter (Signed)
New Message  Pt called states that he had a EKG with Dr. Dossie Arbour.. Who states that he is in AFIB// Dr. Eloise Harman has sent those results in through fax. Pt requests a call back to discuss these results and alternative medications.. Please call

## 2013-06-04 ENCOUNTER — Telehealth: Payer: Self-pay | Admitting: Cardiology

## 2013-06-04 NOTE — Telephone Encounter (Signed)
Received request from Nurse fax box, documents faxed for surgical clearance. To: [facility] Fax number: [phone] Attention: 06/04/13/KM

## 2013-06-04 NOTE — Telephone Encounter (Signed)
Returned call to patient 06/03/13 Dr.Jordan reviewed EKG from Dr.Patterson's office which revealed atrial fib rate 66 beats/min.Dr.Jordan advised to continue same medications.Advised needs appointment to see him next time he is in office.Appointment scheduled with Dr.Jordan 06/21/13 at 4:30 pm.Also received fax from Dr.Mohorn,Dr.Jordan advised ok to have tooth extraction.Letter faxed back at fax # [phone].

## 2013-06-21 ENCOUNTER — Encounter: Payer: Self-pay | Admitting: Cardiology

## 2013-06-21 ENCOUNTER — Ambulatory Visit (INDEPENDENT_AMBULATORY_CARE_PROVIDER_SITE_OTHER): Payer: Medicare Other | Admitting: Cardiology

## 2013-06-21 VITALS — BP 143/67 | HR 69 | Ht 70.0 in | Wt 217.0 lb

## 2013-06-21 DIAGNOSIS — I10 Essential (primary) hypertension: Secondary | ICD-10-CM

## 2013-06-21 DIAGNOSIS — I4891 Unspecified atrial fibrillation: Secondary | ICD-10-CM

## 2013-06-21 DIAGNOSIS — R001 Bradycardia, unspecified: Secondary | ICD-10-CM

## 2013-06-21 DIAGNOSIS — I498 Other specified cardiac arrhythmias: Secondary | ICD-10-CM

## 2013-06-21 NOTE — Progress Notes (Signed)
Anthony Ho Date of Birth: August 20, 1931 Medical Record #[id number]  History of Present Illness: [name] is seen for  followup. He has a history of atrial fibrillation and underwent cardioversion in 2005. His atrial fibrillation recurred and he was placed on amiodarone which converted him  medically. When seen in November he had severe bradycardia with HR in the low 40s. Amiodarone was stopped. About 3 weeks later he developed an URI and was seen by Dr. Philip Ho. He was in atrial fibrillation with HR 66 bpm. He was Rx'd with Amoxicillin. On follow up today he states he is feeling weak. He doesn't know if this is because of the URI or the Afib. He denies dyspnea or chest pain. No palpitations or dizzyness.  Current Outpatient Prescriptions on File Prior to Visit  Medication Sig Dispense Refill  . alendronate (FOSAMAX) 70 MG tablet Take 70 mg by mouth every 7 (seven) days. Take with a full glass of water on an empty stomach.       Marland Kitchen amLODipine (NORVASC) 5 MG tablet Take 5 mg by mouth daily.        . calcium carbonate (OS-CAL) 600 MG TABS Take 600 mg by mouth daily.        . Calcium Carbonate-Vitamin D (CALCIUM + D PO) Take 1,200 mg by mouth.      Marland Kitchen CINNAMON PO Take 1,000 mg by mouth daily.        . ferrous sulfate 325 (65 FE) MG tablet Take 325 mg by mouth daily with breakfast.      . furosemide (LASIX) 40 MG tablet Take 40 mg by mouth daily.        . hydrOXYzine (ATARAX/VISTARIL) 25 MG tablet Take 25 mg by mouth daily. At night      . INSULIN ASPART [place] Inject 16 Units into the skin.       Marland Kitchen linagliptin (TRADJENTA) 5 MG TABS tablet Take 5 mg by mouth at bedtime.       Marland Kitchen losartan (COZAAR) 100 MG tablet Take 100 mg by mouth daily.      . mometasone (ELOCON) 0.1 % cream Apply 1 application topically as needed.       . NON FORMULARY CPAP at night      . pantoprazole (PROTONIX) 40 MG tablet Take 40 mg by mouth daily.        . Potassium Chloride (KLOR-CON PO) Take 25 mg by mouth. Daily,morning       . pravastatin (PRAVACHOL) 40 MG tablet Take 40 mg by mouth daily.        . Tamsulosin HCl (FLOMAX) 0.4 MG CAPS Take 0.4 mg by mouth daily.        Marland Kitchen VITAMIN D, ERGOCALCIFEROL, PO Take 2,000 Units by mouth. D3, daily in morning      . warfarin (COUMADIN) 3 MG tablet Take 3 mg by mouth daily.        No current facility-administered medications on file prior to visit.    No Known Allergies  Past Medical History  Diagnosis Date  . Diabetes mellitus   . Hypertension   . Hyperlipidemia   . Atrial fibrillation   . Barrett esophagus   . Hiatal hernia   . Sleep apnea   . Osteopenia   . Dyspnea   . Aortic insufficiency   . Dizziness   . Chronic anticoagulation   . GERD (gastroesophageal reflux disease)   . Sinus bradycardia 05/05/2013    Past Surgical History  Procedure Laterality  Date  . Cardioversion  2004  . Prostate surgery    . Bladder surgery    . Carpal tunnel release    . Shoulder arthroscopy      RIGHT SHOULDER  . Tumor removal from stomach    . Eye surgery      MULTIPLE WITH ENUCLEATION ON THE LEFT  . Transthoracic echocardiogram  04/19/2010    EF 55-60%  . US echocardiography  03/31/2003    EF 60-65%  . Cardiovascular stress test  04/19/2010    EF 72%    History  Smoking status  . Current Every Day Smoker  . Types: Cigars  Smokeless tobacco  . Never Used    History  Alcohol Use No    Family History  Problem Relation Age of Onset  . Heart failure Mother   . Atrial fibrillation Mother   . Heart attack Father     X2  . Heart attack Sister 74    Review of Systems: As noted in history of present illness.   All other systems were reviewed and are negative.  Physical Exam: BP 143/67  Pulse 69  Ht 5\' 10"  (1.778 m)  Wt 217 lb (98.431 kg)  BMI 31.14 kg/m2 He is a pleasant, elderly white male in no acute distress.  The HEENT exam is normal.  The carotids are 2+ without bruits.  There is no thyromegaly.  There is no JVD.  The lungs are clear.    The  heart exam reveals an irregular rate with a normal S1 and S2.  There are no murmurs, gallops, or rubs.  The PMI is not displaced.   Abdominal exam reveals good bowel sounds.  There is no guarding or rebound.  There is no hepatosplenomegaly or tenderness.  There are no masses.  Exam of the legs reveal trace edema.  The legs are without rashes.  The distal pulses are intact.  Cranial nerves II - XII are intact.  Motor and sensory functions are intact.  The gait is normal.  LABORATORY DATA: ECG demonstrates atrial fibrillation with rate 67 bpm. Nonspecific TWA.  Assessment / Plan: 1. Atrial fibrillation. He has maintained sinus rhythm with a past 9 years on amiodarone. Unfortunately he developed severe bradycardia. Amiodarone was discontinued and now he has recurrent Afib. It is not clear that he is symptomatic. His HR is improved. I will check an Echocardiogram. I encouraged him to increase his activity. I will see him back in 4 weeks. If he has no significant symptoms I would favor rate control and anticoagulation only. If he is symptomatic I would consider resuming Amiodarone but he would need a pacemaker.  2. Hypertension, controlled.  3. Diabetes mellitus now on insulin with improved control.

## 2013-06-21 NOTE — Patient Instructions (Signed)
We will schedule you for an Echocardiogram  Continue your current therapy and get back in the gym  I will see you in 4 weeks.

## 2013-06-28 ENCOUNTER — Encounter: Payer: Self-pay | Admitting: *Deleted

## 2013-07-01 ENCOUNTER — Ambulatory Visit (INDEPENDENT_AMBULATORY_CARE_PROVIDER_SITE_OTHER): Payer: Medicare Other | Admitting: Gastroenterology

## 2013-07-01 ENCOUNTER — Encounter: Payer: Self-pay | Admitting: Gastroenterology

## 2013-07-01 VITALS — BP 120/66 | HR 60 | Ht 70.0 in | Wt 216.2 lb

## 2013-07-01 DIAGNOSIS — I4891 Unspecified atrial fibrillation: Secondary | ICD-10-CM

## 2013-07-01 DIAGNOSIS — K219 Gastro-esophageal reflux disease without esophagitis: Secondary | ICD-10-CM

## 2013-07-01 DIAGNOSIS — K2270 Barrett's esophagus without dysplasia: Secondary | ICD-10-CM

## 2013-07-01 DIAGNOSIS — Z7901 Long term (current) use of anticoagulants: Secondary | ICD-10-CM

## 2013-07-01 MED ORDER — PANTOPRAZOLE SODIUM 40 MG PO TBEC: 40.0000 mg | DELAYED_RELEASE_TABLET | Freq: Every day | ORAL | Status: AC

## 2013-07-01 NOTE — Patient Instructions (Signed)
Follow up in one year  New prescription for Protonix was sent to your pharmacy

## 2013-07-01 NOTE — Progress Notes (Signed)
This is a very nice 80 year old Caucasian male with chronic atrial fibrillation chronic Coumadin anticoagulation.  He also has a long history of acid reflux with Barrett's mucosa and endoscopy performed 3 years ago.  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  He was on the care of Dr. Mikeal Hawthorne bowel at that time.  At the time of his colonoscopy in April of 2011 he also had endoscopy.  Biopsy showed Barrett's mucosa with no evidence of dysplasia.  He denies a current GI complaints, specifically no acid reflux symptoms, dysphagia, melena or hematochezia.  He does occasional have a black stool and is on oral iron therapy was primary care physician.  His cardiologist is Dr. Peter Martinique.  He denies a current cardiovascular symptomatology.  He takes daily Protonix 40 mg.   Current Medications, Allergies, Past Medical History, Past Surgical History, Family History and Social History were reviewed in [employer] record.  ROS: All systems were reviewed and are negative unless otherwise stated in the HPI.          Physical Exam: Healthy-appearing patient in no distress.  Blood pressure 120/66, pulse 60 and regular and weight 216 pounds the BMI of 31.82.  Abdominal exam is entirely benign without organomegaly, masses or tenderness.  Bowel sounds are normal.  Mental status is normal    Assessment and Plan: Apparently this patient's had problems recently with his atrial fibrillation and his anticoagulation.  We will put off his repeat endoscopy for another year, and I've asked him to check with Dr. Martinique about his anticoagulation status and what needs to be done during period of endoscopy we will need to be off of Coumadin for several days.  He said is asymptomatic currently on Protonix 40 mg a day.  I'll set him up appointment to see Dr. Carlean Purl in 1 year time for followup exam and consideration of endoscopic biopsy of his Barrett's mucosa.        CC Dr.  Peter Martinique in cardiology

## 2013-07-06 ENCOUNTER — Ambulatory Visit (HOSPITAL_COMMUNITY): Payer: Medicare Other | Attending: Cardiology | Admitting: Radiology

## 2013-07-06 ENCOUNTER — Other Ambulatory Visit: Payer: Self-pay

## 2013-07-06 DIAGNOSIS — E119 Type 2 diabetes mellitus without complications: Secondary | ICD-10-CM | POA: Insufficient documentation

## 2013-07-06 DIAGNOSIS — E785 Hyperlipidemia, unspecified: Secondary | ICD-10-CM | POA: Insufficient documentation

## 2013-07-06 DIAGNOSIS — I351 Nonrheumatic aortic (valve) insufficiency: Secondary | ICD-10-CM

## 2013-07-06 DIAGNOSIS — I359 Nonrheumatic aortic valve disorder, unspecified: Secondary | ICD-10-CM | POA: Insufficient documentation

## 2013-07-06 DIAGNOSIS — I059 Rheumatic mitral valve disease, unspecified: Secondary | ICD-10-CM | POA: Insufficient documentation

## 2013-07-06 DIAGNOSIS — R001 Bradycardia, unspecified: Secondary | ICD-10-CM

## 2013-07-06 DIAGNOSIS — I079 Rheumatic tricuspid valve disease, unspecified: Secondary | ICD-10-CM | POA: Insufficient documentation

## 2013-07-06 DIAGNOSIS — I4891 Unspecified atrial fibrillation: Secondary | ICD-10-CM | POA: Insufficient documentation

## 2013-07-06 DIAGNOSIS — I498 Other specified cardiac arrhythmias: Secondary | ICD-10-CM | POA: Insufficient documentation

## 2013-07-06 DIAGNOSIS — I10 Essential (primary) hypertension: Secondary | ICD-10-CM | POA: Insufficient documentation

## 2013-07-06 NOTE — Progress Notes (Signed)
Echocardiogram performed.  

## 2013-07-28 ENCOUNTER — Ambulatory Visit (INDEPENDENT_AMBULATORY_CARE_PROVIDER_SITE_OTHER): Payer: Medicare Other | Admitting: Cardiology

## 2013-07-28 ENCOUNTER — Encounter: Payer: Self-pay | Admitting: Cardiology

## 2013-07-28 VITALS — BP 134/64 | HR 67 | Wt 220.8 lb

## 2013-07-28 DIAGNOSIS — I10 Essential (primary) hypertension: Secondary | ICD-10-CM

## 2013-07-28 DIAGNOSIS — R001 Bradycardia, unspecified: Secondary | ICD-10-CM

## 2013-07-28 DIAGNOSIS — I4891 Unspecified atrial fibrillation: Secondary | ICD-10-CM

## 2013-07-28 DIAGNOSIS — I498 Other specified cardiac arrhythmias: Secondary | ICD-10-CM

## 2013-07-28 NOTE — Progress Notes (Signed)
Anthony Ho Date of Birth: 04-Sep-1931 Medical Record #[id number]  History of Present Illness: Anthony Ho is seen for  followup. He has a history of atrial fibrillation and underwent cardioversion in 2005. His atrial fibrillation recurred and he was placed on amiodarone which converted him  medically. When seen in November 2014 he had severe bradycardia with HR in the low 40s. Amiodarone was stopped. About 3 weeks later he was in atrial fibrillation with HR 66 bpm. He is on coumadin. On follow up today he  denies  chest pain. No palpitations or dizzyness. He has been working out in the fitness center daily for one hour daily. When he walks on the treadmill at 3 mph for 20 minutes he does get out of breath. Energy level is good.  Current Outpatient Prescriptions on File Prior to Visit  Medication Sig Dispense Refill  . alendronate (FOSAMAX) 70 MG tablet Take 70 mg by mouth every 7 (seven) days. Take with a full glass of water on an empty stomach.       Marland Kitchen amLODipine (NORVASC) 5 MG tablet Take 5 mg by mouth daily.        Marland Kitchen amoxicillin (AMOXIL) 875 MG tablet       . calcium carbonate (OS-CAL) 600 MG TABS Take 600 mg by mouth daily.        . Calcium Carbonate-Vitamin D (CALCIUM + D PO) Take 1,200 mg by mouth.      . chlorhexidine (PERIDEX) 0.12 % solution       . CINNAMON PO Take 1,000 mg by mouth daily.        . ferrous sulfate 325 (65 FE) MG tablet Take 325 mg by mouth daily with breakfast.      . furosemide (LASIX) 40 MG tablet Take 40 mg by mouth daily.        . hydrOXYzine (ATARAX/VISTARIL) 25 MG tablet Take 25 mg by mouth daily. At night      . INSULIN ASPART [place] Inject 16 Units into the skin.       Marland Kitchen linagliptin (TRADJENTA) 5 MG TABS tablet Take 5 mg by mouth at bedtime.       Marland Kitchen losartan (COZAAR) 100 MG tablet Take 100 mg by mouth daily.      . mometasone (ELOCON) 0.1 % cream Apply 1 application topically as needed.       . NON FORMULARY CPAP at night      . pantoprazole (PROTONIX) 40 MG  tablet Take 1 tablet (40 mg total) by mouth daily.  30 tablet  5  . Potassium Chloride (KLOR-CON PO) Take 25 mg by mouth. Daily,morning      . pravastatin (PRAVACHOL) 40 MG tablet Take 40 mg by mouth daily.        . Tamsulosin HCl (FLOMAX) 0.4 MG CAPS Take 0.4 mg by mouth daily.        Marland Kitchen VITAMIN D, ERGOCALCIFEROL, PO Take 2,000 Units by mouth. D3, daily in morning      . warfarin (COUMADIN) 3 MG tablet Take 3 mg by mouth daily.        No current facility-administered medications on file prior to visit.    No Known Allergies  Past Medical History  Diagnosis Date  . Diabetes mellitus   . Hypertension   . Hyperlipidemia   . Atrial fibrillation   . Barrett esophagus   . Hiatal hernia   . Sleep apnea   . Osteopenia   . Dyspnea   .  Aortic insufficiency   . Dizziness   . Chronic anticoagulation   . GERD (gastroesophageal reflux disease)   . Sinus bradycardia 05/05/2013  . Hyperplastic colon polyp 2006    Past Surgical History  Procedure Laterality Date  . Cardioversion  2004  . Prostate surgery    . Bladder surgery    . Carpal tunnel release    . Shoulder arthroscopy      RIGHT SHOULDER  . Tumor removal from stomach    . Eye surgery      MULTIPLE WITH ENUCLEATION ON THE LEFT  . Transthoracic echocardiogram  04/19/2010    EF 55-60%  . US echocardiography  03/31/2003    EF 60-65%  . Cardiovascular stress test  04/19/2010    EF 72%  . Colonoscopy  08/23/2009    Normal     History  Smoking status  . Current Every Day Smoker  . Types: Cigars  Smokeless tobacco  . Never Used    History  Alcohol Use No    Family History  Problem Relation Age of Onset  . Heart failure Mother   . Atrial fibrillation Mother   . Heart attack Father     X2  . Heart attack Sister 65    Review of Systems: As noted in history of present illness.   All other systems were reviewed and are negative.  Physical Exam: BP 134/64  Pulse 67  Wt 220 lb 12.8 oz (100.154 kg) He is a  pleasant, elderly white male in no acute distress.  The HEENT exam is normal.  The carotids are 2+ without bruits.  There is no thyromegaly.  There is no JVD.  The lungs are clear.    The heart exam reveals an irregular rate with a normal S1 and S2.  There are no murmurs, gallops, or rubs.  The PMI is not displaced.   Abdominal exam reveals good bowel sounds.    Exam of the legs reveal trace edema.  Chronic hyperpigmentation. The distal pulses are intact.  Cranial nerves II - XII are intact.  LABORATORY DATA: Echo:Study Conclusions  - Left ventricle: The cavity size was normal. Wall thickness was increased in a pattern of mild LVH. There was moderate focal basal hypertrophy. Systolic function was normal. The estimated ejection fraction was in the range of 55% to 60%. - Aortic valve: Mild regurgitation. - Mitral valve: Calcified annulus. Mildly thickened leaflets . Mild regurgitation. - Left atrium: The atrium was mildly dilated. - Right atrium: The atrium was mildly dilated. - Atrial septum: No defect or patent foramen ovale was identified. - Impressions: Restrictive mitral inflows suggesting high LA pressures Impressions:  - Restrictive mitral inflows suggesting high LA pressures     Assessment / Plan: 1. Atrial fibrillation. He has maintained sinus rhythm with a past 9 years on amiodarone. Unfortunately he developed severe bradycardia. Amiodarone was discontinued and now he has recurrent Afib. He is minimally symptomatic. His HR is improved.  I would favor rate control and anticoagulation only. The other alternative would be to resume amiodarone and cardiovert him but I think this would require a pacemaker. He is agreeable to this approach. I will follow up in 4 months.  2. Hypertension, controlled.  3. Diabetes mellitus now on insulin with improved control.

## 2013-11-22 ENCOUNTER — Ambulatory Visit (INDEPENDENT_AMBULATORY_CARE_PROVIDER_SITE_OTHER): Payer: Medicare Other | Admitting: Cardiology

## 2013-11-22 ENCOUNTER — Encounter: Payer: Self-pay | Admitting: Cardiology

## 2013-11-22 ENCOUNTER — Other Ambulatory Visit: Payer: Self-pay

## 2013-11-22 VITALS — BP 124/74 | HR 63 | Ht 70.0 in | Wt 211.4 lb

## 2013-11-22 DIAGNOSIS — I4891 Unspecified atrial fibrillation: Secondary | ICD-10-CM

## 2013-11-22 DIAGNOSIS — I359 Nonrheumatic aortic valve disorder, unspecified: Secondary | ICD-10-CM

## 2013-11-22 DIAGNOSIS — I351 Nonrheumatic aortic (valve) insufficiency: Secondary | ICD-10-CM

## 2013-11-22 DIAGNOSIS — I10 Essential (primary) hypertension: Secondary | ICD-10-CM

## 2013-11-22 NOTE — Progress Notes (Signed)
Anthony Ho Date of Birth: 1931-10-29 Medical Record #[id number]  History of Present Illness: Anthony Ho is seen for  followup. He has a history of atrial fibrillation and underwent cardioversion in 2005. His atrial fibrillation recurred and he was placed on amiodarone which converted him  medically. When seen in November 2014 he had severe bradycardia with HR in the low 40s. Amiodarone was stopped. About 3 weeks later he was in atrial fibrillation with HR 66 bpm. He is on coumadin. On follow up today he  denies  chest pain. No palpitations or dizzyness. He has been working out in the fitness center daily for one hour daily.  Energy level is good. He is unaware of his atrial fibrillation. He is active gardening. He is planning on having some oral surgery this week and coumadin is on hold.  Current Outpatient Prescriptions on File Prior to Visit  Medication Sig Dispense Refill  . alendronate (FOSAMAX) 70 MG tablet Take 70 mg by mouth every 7 (seven) days. Take with a full glass of water on an empty stomach.       Marland Kitchen amLODipine (NORVASC) 5 MG tablet Take 5 mg by mouth daily.        . calcium carbonate (OS-CAL) 600 MG TABS Take 600 mg by mouth daily.        . Calcium Carbonate-Vitamin D (CALCIUM + D PO) Take 1,200 mg by mouth.      Marland Kitchen CINNAMON PO Take 1,000 mg by mouth daily.        . ferrous sulfate 325 (65 FE) MG tablet Take 325 mg by mouth daily with breakfast.      . furosemide (LASIX) 40 MG tablet Take 40 mg by mouth daily.        . INSULIN ASPART  Inject 16 Units into the skin. LANTUS 20 Units      . losartan (COZAAR) 100 MG tablet Take 100 mg by mouth daily.      . mometasone (ELOCON) 0.1 % cream Apply 1 application topically as needed.       . NON FORMULARY CPAP at night      . pantoprazole (PROTONIX) 40 MG tablet Take 1 tablet (40 mg total) by mouth daily.  30 tablet  5  . Potassium Chloride (KLOR-CON PO) Take 25 mg by mouth. Daily,morning      . pravastatin (PRAVACHOL) 40 MG tablet  Take 40 mg by mouth daily.        . Tamsulosin HCl (FLOMAX) 0.4 MG CAPS Take 0.4 mg by mouth daily.        Marland Kitchen VITAMIN D, ERGOCALCIFEROL, PO Take 2,000 Units by mouth. D3, daily in morning      . warfarin (COUMADIN) 3 MG tablet Take 3 mg by mouth daily.        No current facility-administered medications on file prior to visit.    No Known Allergies  Past Medical History  Diagnosis Date  . Diabetes mellitus   . Hypertension   . Hyperlipidemia   . Atrial fibrillation   . Barrett esophagus   . Hiatal hernia   . Sleep apnea   . Osteopenia   . Dyspnea   . Aortic insufficiency   . Dizziness   . Chronic anticoagulation   . GERD (gastroesophageal reflux disease)   . Sinus bradycardia 05/05/2013  . Hyperplastic colon polyp 2006    Past Surgical History  Procedure Laterality Date  . Cardioversion  2004  . Prostate surgery    .  Bladder surgery    . Carpal tunnel release    . Shoulder arthroscopy      RIGHT SHOULDER  . Tumor removal from stomach    . Eye surgery      MULTIPLE WITH ENUCLEATION ON THE LEFT  . Transthoracic echocardiogram  04/19/2010    EF 55-60%  . US echocardiography  03/31/2003    EF 60-65%  . Cardiovascular stress test  04/19/2010    EF 72%  . Colonoscopy  08/23/2009    Normal     History  Smoking status  . Current Every Day Smoker  . Types: Cigars  Smokeless tobacco  . Never Used    History  Alcohol Use No    Family History  Problem Relation Age of Onset  . Heart failure Mother   . Atrial fibrillation Mother   . Heart attack Father     X2  . Heart attack Sister 28    Review of Systems: As noted in history of present illness.   All other systems were reviewed and are negative.  Physical Exam: BP 124/74  Pulse 63  Ht 5\' 10"  (1.778 m)  Wt 211 lb 6.4 oz (95.89 kg)  BMI 30.33 kg/m2  SpO2 99% He is a pleasant, elderly white male in no acute distress.  The HEENT exam is normal.  The carotids are 2+ without bruits.  There is no  thyromegaly.  There is no JVD.  The lungs are clear.    The heart exam reveals an irregular rate with a normal S1 and S2.  There are no murmurs, gallops, or rubs.  The PMI is not displaced.   Abdominal exam reveals good bowel sounds.    Exam of the legs reveal trace edema.  Chronic hyperpigmentation. The distal pulses are intact.  Cranial nerves II - XII are intact.  LABORATORY DATA: Echo:Study Conclusions  - Left ventricle: The cavity size was normal. Wall thickness was increased in a pattern of mild LVH. There was moderate focal basal hypertrophy. Systolic function was normal. The estimated ejection fraction was in the range of 55% to 60%. - Aortic valve: Mild regurgitation. - Mitral valve: Calcified annulus. Mildly thickened leaflets . Mild regurgitation. - Left atrium: The atrium was mildly dilated. - Right atrium: The atrium was mildly dilated. - Atrial septum: No defect or patent foramen ovale was identified. - Impressions: Restrictive mitral inflows suggesting high LA pressures Impressions:  - Restrictive mitral inflows suggesting high LA pressures   Ecg: atrial fibrillation. Rate 68 bpm. Nonspecific TWA  Assessment / Plan: 1. Atrial fibrillation.  I would favor rate control and anticoagulation only. He is asymptomatic and functioning at a high level. I will follow up in 6 months.  2. Hypertension, controlled.  3. Diabetes mellitus now on insulin with improved control.

## 2013-11-22 NOTE — Patient Instructions (Signed)
Continue your current therapy  I will see you in 6 months.   

## 2014-02-25 ENCOUNTER — Other Ambulatory Visit: Payer: Self-pay | Admitting: Urology

## 2014-02-25 DIAGNOSIS — M949 Disorder of cartilage, unspecified: Principal | ICD-10-CM

## 2014-02-25 DIAGNOSIS — M899 Disorder of bone, unspecified: Secondary | ICD-10-CM

## 2014-03-11 ENCOUNTER — Ambulatory Visit
Admission: RE | Admit: 2014-03-11 | Discharge: 2014-03-11 | Disposition: A | Discharge status: Home / Self Care | Payer: Medicare Other | Source: Ambulatory Visit | Attending: Urology | Admitting: Urology

## 2014-03-11 DIAGNOSIS — M949 Disorder of cartilage, unspecified: Principal | ICD-10-CM

## 2014-03-11 DIAGNOSIS — M899 Disorder of bone, unspecified: Secondary | ICD-10-CM

## 2014-03-18 ENCOUNTER — Encounter: Payer: Self-pay | Admitting: Gastroenterology

## 2014-04-11 ENCOUNTER — Other Ambulatory Visit (HOSPITAL_COMMUNITY): Payer: Self-pay | Admitting: Internal Medicine

## 2014-04-11 ENCOUNTER — Ambulatory Visit (HOSPITAL_COMMUNITY)
Admission: RE | Admit: 2014-04-11 | Discharge: 2014-04-11 | Disposition: A | Discharge status: Home / Self Care | Payer: Medicare Other | Source: Ambulatory Visit | Attending: Surgery | Admitting: Surgery

## 2014-04-11 DIAGNOSIS — M7989 Other specified soft tissue disorders: Secondary | ICD-10-CM | POA: Diagnosis not present

## 2014-04-11 DIAGNOSIS — R609 Edema, unspecified: Secondary | ICD-10-CM

## 2014-04-11 DIAGNOSIS — M79604 Pain in right leg: Secondary | ICD-10-CM | POA: Diagnosis present

## 2014-06-14 ENCOUNTER — Ambulatory Visit (INDEPENDENT_AMBULATORY_CARE_PROVIDER_SITE_OTHER): Payer: Medicare Other | Admitting: Cardiology

## 2014-06-14 ENCOUNTER — Encounter: Payer: Self-pay | Admitting: Cardiology

## 2014-06-14 VITALS — BP 124/80 | Ht 70.0 in | Wt 222.0 lb

## 2014-06-14 DIAGNOSIS — I482 Chronic atrial fibrillation, unspecified: Secondary | ICD-10-CM

## 2014-06-14 DIAGNOSIS — I10 Essential (primary) hypertension: Secondary | ICD-10-CM

## 2014-06-14 NOTE — Progress Notes (Signed)
Anthony Ho Date of Birth: [birth year] Medical Record #[id number]  History of Present Illness: Anthony Ho is seen for  followup of Afib. He has a history of atrial fibrillation and underwent cardioversion in 2005. His atrial fibrillation recurred and he was placed on amiodarone which converted him  medically. When seen in November 2014 he had severe bradycardia with HR in the low 40s. Amiodarone was stopped. About 3 weeks later he was in atrial fibrillation with HR 66 bpm. He is on coumadin. He has since been treated with rate control and anticoagulation. On follow up today he  denies  chest pain. No palpitations or dizzyness. He has been working out in the fitness center daily.  He can walk on treadmill at 3.2 MPH for 20 minutes before he gets winded. Energy level is good. He is unaware of his atrial fibrillation.   Current Outpatient Prescriptions on File Prior to Visit  Medication Sig Dispense Refill  . alendronate (FOSAMAX) 70 MG tablet Take 70 mg by mouth every 7 (seven) days. Take with a full glass of water on an empty stomach.     Marland Kitchen amLODipine (NORVASC) 5 MG tablet Take 5 mg by mouth daily.      . calcium carbonate (OS-CAL) 600 MG TABS Take 600 mg by mouth daily.      . Calcium Carbonate-Vitamin D (CALCIUM + D PO) Take 1,200 mg by mouth.    Marland Kitchen CINNAMON PO Take 1,000 mg by mouth daily.      . ferrous sulfate 325 (65 FE) MG tablet Take 325 mg by mouth daily with breakfast.    . furosemide (LASIX) 40 MG tablet Take 40 mg by mouth daily.      . INSULIN ASPART [place] Inject 16 Units into the skin. Inject 76 units into the skin. Lantus 76 units    . losartan (COZAAR) 100 MG tablet Take 100 mg by mouth daily.    . NON FORMULARY CPAP at night    . pantoprazole (PROTONIX) 40 MG tablet Take 1 tablet (40 mg total) by mouth daily. 30 tablet 5  . Potassium Chloride (KLOR-CON PO) Take 25 mg by mouth. Daily,morning    . pravastatin (PRAVACHOL) 40 MG tablet Take 40 mg by mouth daily.      Marland Kitchen VITAMIN D,  ERGOCALCIFEROL, PO Take 2,000 Units by mouth. D3, daily in morning    . warfarin (COUMADIN) 3 MG tablet Take 3 mg by mouth daily.      No current facility-administered medications on file prior to visit.    No Known Allergies  Past Medical History  Diagnosis Date  . Diabetes mellitus   . Hypertension   . Hyperlipidemia   . Atrial fibrillation   . Barrett esophagus   . Hiatal hernia   . Sleep apnea   . Osteopenia   . Dyspnea   . Aortic insufficiency   . Dizziness   . Chronic anticoagulation   . GERD (gastroesophageal reflux disease)   . Sinus bradycardia 05/05/2013  . Hyperplastic colon polyp 2006    Past Surgical History  Procedure Laterality Date  . Cardioversion  2004  . Prostate surgery    . Bladder surgery    . Carpal tunnel release    . Shoulder arthroscopy      RIGHT SHOULDER  . Tumor removal from stomach    . Eye surgery      MULTIPLE WITH ENUCLEATION ON THE LEFT  . Transthoracic echocardiogram  04/19/2010    EF 55-60%  .  US echocardiography  03/31/2003    EF 60-65%  . Cardiovascular stress test  04/19/2010    EF 72%  . Colonoscopy  08/23/2009    Normal     History  Smoking status  . Current Every Day Smoker  . Types: Cigars  Smokeless tobacco  . Never Used    History  Alcohol Use No    Family History  Problem Relation Age of Onset  . Heart failure Mother   . Atrial fibrillation Mother   . Heart attack Father     X2  . Heart attack Sister 63    Review of Systems: As noted in history of present illness.   All other systems were reviewed and are negative.  Physical Exam: BP 124/80 mmHg  Ht 5\' 10"  (1.778 m)  Wt 222 lb (100.699 kg)  BMI 31.85 kg/m2 He is a pleasant, elderly white male in no acute distress.  The HEENT exam is normal.  The carotids are 2+ without bruits.  There is no thyromegaly.  There is no JVD.  The lungs are clear.    The heart exam reveals an irregular rate with a normal S1 and S2.  There are no murmurs, gallops, or  rubs.  The PMI is not displaced.   Abdominal exam reveals good bowel sounds.    Exam of the legs reveal trace edema.  Chronic hyperpigmentation. The distal pulses are intact.  Cranial nerves II - XII are intact.  LABORATORY DATA: Echo:Study Conclusions  - Left ventricle: The cavity size was normal. Wall thickness was increased in a pattern of mild LVH. There was moderate focal basal hypertrophy. Systolic function was normal. The estimated ejection fraction was in the range of 55% to 60%. - Aortic valve: Mild regurgitation. - Mitral valve: Calcified annulus. Mildly thickened leaflets . Mild regurgitation. - Left atrium: The atrium was mildly dilated. - Right atrium: The atrium was mildly dilated. - Atrial septum: No defect or patent foramen ovale was identified. - Impressions: Restrictive mitral inflows suggesting high LA pressures Impressions:  - Restrictive mitral inflows suggesting high LA pressures   Ecg: today atrial fibrillation. Rate 70 bpm. Otherwise normal. I have personally reviewed and interpreted this study.   Assessment / Plan: 1. Atrial fibrillation.  I would favor continued rate control and anticoagulation. He is asymptomatic and functioning at a high level. I will follow up in 6 months.  2. Hypertension, controlled.  3. Diabetes mellitus now on insulin with improved control.

## 2014-06-14 NOTE — Patient Instructions (Signed)
Continue your current therapy  Try and restrict your salt intake  Keep up your exercise  I will see you in 6 months

## 2014-09-12 ENCOUNTER — Telehealth: Payer: Self-pay | Admitting: Cardiology

## 2014-09-13 NOTE — Telephone Encounter (Signed)
Close encounter 

## 2014-09-29 ENCOUNTER — Telehealth: Payer: Self-pay | Admitting: Cardiology

## 2014-10-10 NOTE — Telephone Encounter (Signed)
Closed encounter °

## 2014-12-14 ENCOUNTER — Ambulatory Visit: Payer: Self-pay | Admitting: Cardiology

## 2014-12-15 ENCOUNTER — Telehealth: Payer: Self-pay | Admitting: Cardiology

## 2014-12-15 DIAGNOSIS — I10 Essential (primary) hypertension: Secondary | ICD-10-CM

## 2014-12-15 DIAGNOSIS — I351 Nonrheumatic aortic (valve) insufficiency: Secondary | ICD-10-CM

## 2014-12-15 DIAGNOSIS — R001 Bradycardia, unspecified: Secondary | ICD-10-CM

## 2014-12-15 NOTE — Telephone Encounter (Signed)
Pt called in wanting to reschedule his appt from yesterday . He says that he is currently in Afib and would like to come back in and be seen by Dr. [place] as soon as possible. Please f/u with pt  Thanks

## 2014-12-15 NOTE — Telephone Encounter (Signed)
Returned call to patient he stated he was sorry he missed his appointment with Dr.Jordan 12/14/14.Stated he needs to reschedule soon he is still in AFib.Appointment scheduled with Dr.Jordan 12/23/14 at 4:30 pm.Also wants to have fasting lab before appointment.Lab orders put in for 12/16/14.

## 2014-12-16 LAB — TSH: TSH: 1.079 u[IU]/mL (ref 0.350–4.500)

## 2014-12-23 ENCOUNTER — Ambulatory Visit (INDEPENDENT_AMBULATORY_CARE_PROVIDER_SITE_OTHER): Payer: Medicare Other | Admitting: Cardiology

## 2014-12-23 ENCOUNTER — Encounter: Payer: Self-pay | Admitting: Cardiology

## 2014-12-23 VITALS — BP 124/72 | HR 64 | Ht 70.0 in | Wt 217.3 lb

## 2014-12-23 DIAGNOSIS — I482 Chronic atrial fibrillation, unspecified: Secondary | ICD-10-CM

## 2014-12-23 DIAGNOSIS — R600 Localized edema: Secondary | ICD-10-CM | POA: Diagnosis not present

## 2014-12-23 DIAGNOSIS — I10 Essential (primary) hypertension: Secondary | ICD-10-CM | POA: Diagnosis not present

## 2014-12-23 NOTE — Patient Instructions (Signed)
Reduce amlodipine to 2.5 mg daily  You need to restrict your sodium intake.Low-Sodium Eating Plan Sodium raises blood pressure and causes water to be held in the body. Getting less sodium from food will help lower your blood pressure, reduce any swelling, and protect your heart, liver, and kidneys. We get sodium by adding salt (sodium chloride) to food. Most of our sodium comes from canned, boxed, and frozen foods. Restaurant foods, fast foods, and pizza are also very high in sodium. Even if you take medicine to lower your blood pressure or to reduce fluid in your body, getting less sodium from your food is important. WHAT IS MY PLAN? Most people should limit their sodium intake to 2,300 mg a day. Your health care provider recommends that you limit your sodium intake to __________ a day.  WHAT DO I NEED TO KNOW ABOUT THIS EATING PLAN? For the low-sodium eating plan, you will follow these general guidelines:  Choose foods with a % Daily Value for sodium of less than 5% (as listed on the food label).   Use salt-free seasonings or herbs instead of table salt or sea salt.   Check with your health care provider or pharmacist before using salt substitutes.   Eat fresh foods.  Eat more vegetables and fruits.  Limit canned vegetables. If you do use them, rinse them well to decrease the sodium.   Limit cheese to 1 oz (28 g) per day.   Eat lower-sodium products, often labeled as "lower sodium" or "no salt added."  Avoid foods that contain monosodium glutamate (MSG). MSG is sometimes added to [place] food and some canned foods.  Check food labels (Nutrition Facts labels) on foods to learn how much sodium is in one serving.  Eat more home-cooked food and less restaurant, buffet, and fast food.  When eating at a restaurant, ask that your food be prepared with less salt or none, if possible.  HOW DO I READ FOOD LABELS FOR SODIUM INFORMATION? The Nutrition Facts label lists the amount of  sodium in one serving of the food. If you eat more than one serving, you must multiply the listed amount of sodium by the number of servings. Food labels may also identify foods as:  Sodium free--Less than 5 mg in a serving.  Very low sodium--35 mg or less in a serving.  Low sodium--140 mg or less in a serving.  Light in sodium--50% less sodium in a serving. For example, if a food that usually has 300 mg of sodium is changed to become light in sodium, it will have 150 mg of sodium.  Reduced sodium--25% less sodium in a serving. For example, if a food that usually has 400 mg of sodium is changed to reduced sodium, it will have 300 mg of sodium. WHAT FOODS CAN I EAT? Grains Low-sodium cereals, including oats, puffed wheat and rice, and shredded wheat cereals. Low-sodium crackers. Unsalted rice and pasta. Lower-sodium bread.  Vegetables Frozen or fresh vegetables. Low-sodium or reduced-sodium canned vegetables. Low-sodium or reduced-sodium tomato sauce and paste. Low-sodium or reduced-sodium tomato and vegetable juices.  Fruits Fresh, frozen, and canned fruit. Fruit juice.  Meat and Other Protein Products Low-sodium canned tuna and salmon. Fresh or frozen meat, poultry, seafood, and fish. Lamb. Unsalted nuts. Dried beans, peas, and lentils without added salt. Unsalted canned beans. Homemade soups without salt. Eggs.  Dairy Milk. Soy milk. Ricotta cheese. Low-sodium or reduced-sodium cheeses. Yogurt.  Condiments Fresh and dried herbs and spices. Salt-free seasonings. Onion and garlic  powders. Low-sodium varieties of mustard and ketchup. Lemon juice.  Fats and Oils Reduced-sodium salad dressings. Unsalted butter.  Other Unsalted popcorn and pretzels.  The items listed above may not be a complete list of recommended foods or beverages. Contact your dietitian for more options. WHAT FOODS ARE NOT RECOMMENDED? Grains Instant hot cereals. Bread stuffing, pancake, and biscuit mixes.  Croutons. Seasoned rice or pasta mixes. Noodle soup cups. Boxed or frozen macaroni and cheese. Self-rising flour. Regular salted crackers. Vegetables Regular canned vegetables. Regular canned tomato sauce and paste. Regular tomato and vegetable juices. Frozen vegetables in sauces. Salted french fries. Olives. Angie Fava. Relishes. Sauerkraut. Salsa. Meat and Other Protein Products Salted, canned, smoked, spiced, or pickled meats, seafood, or fish. Bacon, ham, sausage, hot dogs, corned beef, chipped beef, and packaged luncheon meats. Salt pork. Jerky. Pickled herring. Anchovies, regular canned tuna, and sardines. Salted nuts. Dairy Processed cheese and cheese spreads. Cheese curds. Blue cheese and cottage cheese. Buttermilk.  Condiments Onion and garlic salt, seasoned salt, table salt, and sea salt. Canned and packaged gravies. Worcestershire sauce. Tartar sauce. Barbecue sauce. Teriyaki sauce. Soy sauce, including reduced sodium. Steak sauce. Fish sauce. Oyster sauce. Cocktail sauce. Horseradish. Regular ketchup and mustard. Meat flavorings and tenderizers. Bouillon cubes. Hot sauce. Tabasco sauce. Marinades. Taco seasonings. Relishes. Fats and Oils Regular salad dressings. Salted butter. Margarine. Ghee. Bacon fat.  Other Potato and tortilla chips. Corn chips and puffs. Salted popcorn and pretzels. Canned or dried soups. Pizza. Frozen entrees and pot pies.  The items listed above may not be a complete list of foods and beverages to avoid. Contact your dietitian for more information. Document Released: 11/23/2001 Document Revised: 06/08/2013 Document Reviewed: 04/07/2013 [facility] Patient Information 2015 [place], [place]. This information is not intended to replace advice given to you by your health care provider. Make sure you discuss any questions you have with your health care provider.

## 2014-12-23 NOTE — Progress Notes (Signed)
Anthony Ho Date of Birth: 12-17-1931 Medical Record #[id number]  History of Present Illness: [name] is seen for  followup of Afib. He has a history of atrial fibrillation and underwent cardioversion in 2005. His atrial fibrillation recurred and he was placed on amiodarone which converted him  medically. When seen in November 2014 he had severe bradycardia with HR in the low 40s. Amiodarone was stopped. About 3 weeks later he was in atrial fibrillation with HR 66 bpm. He is on coumadin. He has since been treated with rate control and anticoagulation. On follow up today he  denies chest pain. No palpitations or dizzyness. He now reports acute hematuria. He has passed clots. He has had this in the past and is going to contact Dr. Gaynelle Arabian. Reports INR 3.2 earlier this week. He has lost 5 lbs. Does note increase swelling in legs that go down at night. Eats a lot of salty snacks.  Current Outpatient Prescriptions on File Prior to Visit  Medication Sig Dispense Refill  . alendronate (FOSAMAX) 70 MG tablet Take 70 mg by mouth every 7 (seven) days. Take with a full glass of water on an empty stomach.     Marland Kitchen amLODipine (NORVASC) 5 MG tablet Take 2.5 mg by mouth daily.    . Calcium Carbonate-Vitamin D (CALCIUM + D PO) Take 1,200 mg by mouth.    Marland Kitchen CINNAMON PO Take 1,000 mg by mouth daily.      . ferrous sulfate 325 (65 FE) MG tablet Take 325 mg by mouth daily with breakfast.    . furosemide (LASIX) 40 MG tablet Take 40 mg by mouth daily.      . INSULIN ASPART [place] Inject 16 Units into the skin. Inject 76 units into the skin. Lantus 76 units    . losartan (COZAAR) 100 MG tablet Take 100 mg by mouth daily.    . NON FORMULARY CPAP at night    . pantoprazole (PROTONIX) 40 MG tablet Take 1 tablet (40 mg total) by mouth daily. 30 tablet 5  . Potassium Chloride (KLOR-CON PO) Take 25 mg by mouth. Daily,morning    . pravastatin (PRAVACHOL) 40 MG tablet Take 40 mg by mouth daily.      Marland Kitchen VITAMIN D,  ERGOCALCIFEROL, PO Take 2,000 Units by mouth. D3, daily in morning    . warfarin (COUMADIN) 3 MG tablet Take 3 mg by mouth daily.      No current facility-administered medications on file prior to visit.    No Known Allergies  Past Medical History  Diagnosis Date  . Diabetes mellitus   . Hypertension   . Hyperlipidemia   . Atrial fibrillation   . Barrett esophagus   . Hiatal hernia   . Sleep apnea   . Osteopenia   . Dyspnea   . Aortic insufficiency   . Dizziness   . Chronic anticoagulation   . GERD (gastroesophageal reflux disease)   . Sinus bradycardia 05/05/2013  . Hyperplastic colon polyp 2006    Past Surgical History  Procedure Laterality Date  . Cardioversion  2004  . Prostate surgery    . Bladder surgery    . Carpal tunnel release    . Shoulder arthroscopy      RIGHT SHOULDER  . Tumor removal from stomach    . Eye surgery      MULTIPLE WITH ENUCLEATION ON THE LEFT  . Transthoracic echocardiogram  04/19/2010    EF 55-60%  . US echocardiography  03/31/2003  EF 60-65%  . Cardiovascular stress test  04/19/2010    EF 72%  . Colonoscopy  08/23/2009    Normal     History  Smoking status  . Current Every Day Smoker  . Types: Cigars  Smokeless tobacco  . Never Used    History  Alcohol Use No    Family History  Problem Relation Age of Onset  . Heart failure Mother   . Atrial fibrillation Mother   . Heart attack Father     X2  . Heart attack Sister 108    Review of Systems: As noted in history of present illness.   All other systems were reviewed and are negative.  Physical Exam: BP 124/72 mmHg  Pulse 64  Ht 5\' 10"  (1.778 m)  Wt 98.567 kg (217 lb 4.8 oz)  BMI 31.18 kg/m2 He is a pleasant, elderly white male in no acute distress.  The HEENT exam is normal.  The carotids are 2+ without bruits.  There is no thyromegaly.  There is no JVD.  The lungs are clear.    The heart exam reveals an irregular rate with a normal S1 and S2.  There are no  murmurs, gallops, or rubs.  The PMI is not displaced.   Abdominal exam reveals good bowel sounds.    Exam of the legs 2+ edema.  Chronic hyperpigmentation. The distal pulses are intact.  Cranial nerves II - XII are intact.  LABORATORY DATA: Echo:Study Conclusions  - Left ventricle: The cavity size was normal. Wall thickness was increased in a pattern of mild LVH. There was moderate focal basal hypertrophy. Systolic function was normal. The estimated ejection fraction was in the range of 55% to 60%. - Aortic valve: Mild regurgitation. - Mitral valve: Calcified annulus. Mildly thickened leaflets . Mild regurgitation. - Left atrium: The atrium was mildly dilated. - Right atrium: The atrium was mildly dilated. - Atrial septum: No defect or patent foramen ovale was identified. - Impressions: Restrictive mitral inflows suggesting high LA pressures Impressions:  - Restrictive mitral inflows suggesting high LA pressures     Assessment / Plan: 1. Atrial fibrillation.  Continue rate control and anticoagulation. He is asymptomatic and functioning at a high level. If he requires urologic procedures he may come off Coumadin for 5 days prior.   2. Hypertension, controlled.  3. Diabetes mellitus now on insulin with improved control.  4. Edema. Recommend reducing amlodipine to 2.5 mg daily. Continue lasix 40 mg daily. Needs to do a better job with sodium restriction.  5. Hematuria. Follow up with urology.

## 2014-12-26 ENCOUNTER — Encounter: Payer: Self-pay | Admitting: Cardiology

## 2015-10-19 ENCOUNTER — Ambulatory Visit (INDEPENDENT_AMBULATORY_CARE_PROVIDER_SITE_OTHER): Payer: Medicare Other | Admitting: Family Medicine

## 2015-10-19 VITALS — BP 138/78 | HR 77 | Temp 98.0°F | Resp 16 | Ht 69.0 in | Wt 229.0 lb

## 2015-10-19 DIAGNOSIS — S40861A Insect bite (nonvenomous) of right upper arm, initial encounter: Secondary | ICD-10-CM | POA: Diagnosis not present

## 2015-10-19 DIAGNOSIS — W57XXXA Bitten or stung by nonvenomous insect and other nonvenomous arthropods, initial encounter: Secondary | ICD-10-CM

## 2015-10-19 NOTE — Patient Instructions (Addendum)
1.  Apply itch cream 2-3 times per day. 2.  You can take Claritin or Zyrtec 10mg  one daily for itching.   3.  Return for fever, pain at site, or target or bullseye rash.  Also return for severe headache, sore throat, diffuse rash.     IF you received an x-ray today, you will receive an invoice from [facility]. Please contact [facility] at [phone] with questions or concerns regarding your invoice.   IF you received labwork today, you will receive an invoice from [employer]. Please contact [facility] at [phone] with questions or concerns regarding your invoice.   Our billing staff will not be able to assist you with questions regarding bills from these companies.  You will be contacted with the lab results as soon as they are available. The fastest way to get your results is to activate your My Chart account. Instructions are located on the last page of this paperwork. If you have not heard from [place] regarding the results in 2 weeks, please contact this office.     Tick Bite Information Ticks are insects that attach themselves to the skin and draw blood for food. There are various types of ticks. Common types include wood ticks and deer ticks. Most ticks live in shrubs and grassy areas. Ticks can climb onto your body when you make contact with leaves or grass where the tick is waiting. The most common places on the body for ticks to attach themselves are the scalp, neck, armpits, waist, and groin. Most tick bites are harmless, but sometimes ticks carry germs that cause diseases. These germs can be spread to a person during the tick's feeding process. The chance of a disease spreading through a tick bite depends on:   The type of tick.  Time of year.   How long the tick is attached.   Geographic location.  HOW CAN YOU PREVENT TICK BITES? Take these steps to help prevent tick bites when you are outdoors:  Wear protective clothing. Long  sleeves and long pants are best.   Wear white clothes so you can see ticks more easily.  Tuck your pant legs into your socks.   If walking on a trail, stay in the middle of the trail to avoid brushing against bushes.  Avoid walking through areas with long grass.  Put insect repellent on all exposed skin and along boot tops, pant legs, and sleeve cuffs.   Check clothing, hair, and skin repeatedly and before going inside.   Brush off any ticks that are not attached.  Take a shower or bath as soon as possible after being outdoors.  WHAT IS THE PROPER WAY TO REMOVE A TICK? Ticks should be removed as soon as possible to help prevent diseases caused by tick bites. 1. If latex gloves are available, put them on before trying to remove a tick.  2. Using fine-point tweezers, grasp the tick as close to the skin as possible. You may also use curved forceps or a tick removal tool. Grasp the tick as close to its head as possible. Avoid grasping the tick on its body. 3. Pull gently with steady upward pressure until the tick lets go. Do not twist the tick or jerk it suddenly. This may break off the tick's head or mouth parts. 4. Do not squeeze or crush the tick's body. This could force disease-carrying fluids from the tick into your body.  5. After the tick is removed, wash the bite area  and your hands with soap and water or other disinfectant such as alcohol. 6. Apply a small amount of antiseptic cream or ointment to the bite site.  7. Wash and disinfect any instruments that were used.  Do not try to remove a tick by applying a hot match, petroleum jelly, or fingernail polish to the tick. These methods do not work and may increase the chances of disease being spread from the tick bite.  WHEN SHOULD YOU SEEK MEDICAL CARE? Contact your health care provider if you are unable to remove a tick from your skin or if a part of the tick breaks off and is stuck in the skin.  After a tick bite, you  need to be aware of signs and symptoms that could be related to diseases spread by ticks. Contact your health care provider if you develop any of the following in the days or weeks after the tick bite:  Unexplained fever.  Rash. A circular rash that appears days or weeks after the tick bite may indicate the possibility of Lyme disease. The rash may resemble a target with a bull's-eye and may occur at a different part of your body than the tick bite.  Redness and swelling in the area of the tick bite.   Tender, swollen lymph glands.   Diarrhea.   Weight loss.   Cough.   Fatigue.   Muscle, joint, or bone pain.   Abdominal pain.   Headache.   Lethargy or a change in your level of consciousness.  Difficulty walking or moving your legs.   Numbness in the legs.   Paralysis.  Shortness of breath.   Confusion.   Repeated vomiting.    This information is not intended to replace advice given to you by your health care provider. Make sure you discuss any questions you have with your health care provider.   Document Released: 05/31/2000 Document Revised: 06/24/2014 Document Reviewed: 11/11/2012 Elsevier Interactive Patient Education [employer].

## 2015-10-19 NOTE — Progress Notes (Signed)
Subjective:    Patient ID: Anthony Ho, male    DOB: [birth year], 82 y.o.   MRN: [id number]  10/19/2015  Tick bite   HPI This 82 y.o. male presents for evaluation of tick bite R axilla.  Today started itching and scratching.  Redness at site of bite.  Looks like FB in place.  No fever/chills/sweats.  No pain at site.  Worried about remaining tick at bite site.   Review of Systems  Constitutional: Negative for fever, chills, diaphoresis, activity change, appetite change and fatigue.  Respiratory: Negative for cough and shortness of breath.   Cardiovascular: Negative for chest pain, palpitations and leg swelling.  Gastrointestinal: Negative for nausea, vomiting, abdominal pain and diarrhea.  Endocrine: Negative for cold intolerance, heat intolerance, polydipsia, polyphagia and polyuria.  Skin: Positive for color change, pallor, rash and wound.  Neurological: Negative for dizziness, tremors, seizures, syncope, facial asymmetry, speech difficulty, weakness, light-headedness, numbness and headaches.  Psychiatric/Behavioral: Negative for sleep disturbance and dysphoric mood. The patient is not nervous/anxious.     Past Medical History  Diagnosis Date  . Diabetes mellitus   . Hypertension   . Hyperlipidemia   . Atrial fibrillation ([place])   . Barrett esophagus   . Hiatal hernia   . Sleep apnea   . Osteopenia   . Dyspnea   . Aortic insufficiency   . Dizziness   . Chronic anticoagulation   . GERD (gastroesophageal reflux disease)   . Sinus bradycardia 05/05/2013  . Hyperplastic colon polyp 2006   Past Surgical History  Procedure Laterality Date  . Cardioversion  2004  . Prostate surgery    . Bladder surgery    . Carpal tunnel release    . Shoulder arthroscopy      RIGHT SHOULDER  . Tumor removal from stomach    . Eye surgery      MULTIPLE WITH ENUCLEATION ON THE LEFT  . Transthoracic echocardiogram  04/19/2010    EF 55-60%  . US echocardiography  03/31/2003    EF 60-65%    . Cardiovascular stress test  04/19/2010    EF 72%  . Colonoscopy  08/23/2009    Normal   . Colon surgery     No Known Allergies  Social History   Social History  . Marital Status: Married    Spouse Name: N/A  . Number of Children: 4  . Years of Education: N/A   Occupational History  . Retired    Social History Main Topics  . Smoking status: Current Every Day Smoker    Types: Cigars  . Smokeless tobacco: Never Used  . Alcohol Use: No  . Drug Use: No  . Sexual Activity: Not on file   Other Topics Concern  . Not on file   Social History Narrative   Family History  Problem Relation Age of Onset  . Heart failure Mother   . Atrial fibrillation Mother   . Heart attack Father     X2  . Heart attack Sister 62       Objective:    BP 138/78 mmHg  Pulse 77  Temp(Src) 98 F (36.7 C) (Oral)  Resp 16  Ht 5\' 9"  (1.753 m)  Wt 229 lb (103.874 kg)  BMI 33.80 kg/m2  SpO2 98% Physical Exam  Constitutional: He is oriented to person, place, and time. He appears well-developed and well-nourished. No distress.  HENT:  Head: Normocephalic and atraumatic.  Right Ear: External ear normal.  Left Ear: External ear  normal.  Nose: Nose normal.  Mouth/Throat: Oropharynx is clear and moist.  Eyes: Conjunctivae and EOM are normal. Pupils are equal, round, and reactive to light.  Neck: Normal range of motion. Neck supple. Carotid bruit is not present. No thyromegaly present.  Cardiovascular: Normal rate, regular rhythm, normal heart sounds and intact distal pulses.  Exam reveals no gallop and no friction rub.   No murmur heard. Pulmonary/Chest: Effort normal and breath sounds normal. He has no wheezes. He has no rales.  Abdominal: Soft. Bowel sounds are normal. He exhibits no distension and no mass. There is no tenderness. There is no rebound and no guarding.  Lymphadenopathy:    He has no cervical adenopathy.  Neurological: He is alert and oriented to person, place, and time. No  cranial nerve deficit.  Skin: Skin is warm and dry. Rash noted. He is not diaphoretic. There is erythema.  R axilla with insect bite present with eschar with surrounding 3 cm of mild erythema.  Non-tender.  No fluctuance; no streaking.  Psychiatric: He has a normal mood and affect. His behavior is normal.  Nursing note and vitals reviewed.  Results for orders placed or performed in visit on 12/15/14  TSH  Result Value Ref Range   TSH 1.079 0.350 - 4.500 uIU/mL       Assessment & Plan:   1. Tick bite of axillary region, right, initial encounter    -New. -no foreign body remaining at site. -itching and redness suggestive of local allergic reaction to bite; no evidence of secondary cellulitis or wound infection. -recommend topical Benadryl and steroid cream. -RTC for fever, pain, drainage. -recommend Claritin or Zyrtec daily for one week.   No orders of the defined types were placed in this encounter.   No orders of the defined types were placed in this encounter.    No Follow-up on file.    Alesia Oshields Elayne Guerin, M.D. [facility] [address] [place], [place]  [address] [phone] phone [phone] fax

## 2016-02-28 ENCOUNTER — Encounter: Payer: Self-pay | Admitting: Cardiology

## 2016-03-01 ENCOUNTER — Other Ambulatory Visit: Payer: Self-pay | Admitting: Urology

## 2016-03-01 DIAGNOSIS — M810 Age-related osteoporosis without current pathological fracture: Secondary | ICD-10-CM

## 2016-03-01 DIAGNOSIS — E559 Vitamin D deficiency, unspecified: Secondary | ICD-10-CM

## 2016-03-06 ENCOUNTER — Other Ambulatory Visit: Payer: Self-pay | Admitting: Urology

## 2016-03-06 DIAGNOSIS — M8580 Other specified disorders of bone density and structure, unspecified site: Secondary | ICD-10-CM

## 2016-03-06 DIAGNOSIS — E559 Vitamin D deficiency, unspecified: Secondary | ICD-10-CM

## 2016-03-13 NOTE — Progress Notes (Signed)
Anthony Ho Date of Birth: 05-Jul-1931 Medical Record [id number]  History of Present Illness: Anthony Ho is seen for  followup of Afib. He has a history of atrial fibrillation and underwent cardioversion in 2005. His atrial fibrillation recurred and he was placed on amiodarone which converted him  medically. When seen in November 2014 he had severe bradycardia with HR in the low 40s. Amiodarone was stopped. About 3 weeks later he was in atrial fibrillation with HR 66 bpm. He is on coumadin. He has since been treated with rate control and anticoagulation. On follow up today he  denies chest pain. No palpitations or dizzyness.   Edema has been well contolled. Still eats a lot of salty snacks. He does go to the fitness center 5 days/week and exercises for one hour. He thinks he tires more easily. His wife thinks he mouth breaths more now.   Current Outpatient Prescriptions on File Prior to Visit  Medication Sig Dispense Refill  . alendronate (FOSAMAX) 70 MG tablet Take 70 mg by mouth every 7 (seven) days. Take with a full glass of water on an empty stomach.     Marland Kitchen amLODipine (NORVASC) 5 MG tablet Take 2.5 mg by mouth daily.    . Calcium Carbonate-Vitamin D (CALCIUM + D PO) Take 1,200 mg by mouth.    Marland Kitchen CINNAMON PO Take 1,000 mg by mouth daily.      . ferrous sulfate 325 (65 FE) MG tablet Take 325 mg by mouth daily with breakfast.    . furosemide (LASIX) 40 MG tablet Take 40 mg by mouth daily.      Marland Kitchen gabapentin (NEURONTIN) 300 MG capsule Take 300 mg by mouth at bedtime.    . INSULIN ASPART [place] Inject 16 Units into the skin. Inject 76 units into the skin. Lantus 76 units    . losartan (COZAAR) 100 MG tablet Take 100 mg by mouth daily.    . NON FORMULARY CPAP at night    . pantoprazole (PROTONIX) 40 MG tablet Take 1 tablet (40 mg total) by mouth daily. 30 tablet 5  . Potassium Chloride (KLOR-CON PO) Take 25 mg by mouth. Daily,morning    . pravastatin (PRAVACHOL) 40 MG tablet Take 40 mg by mouth daily.       Marland Kitchen VITAMIN D, ERGOCALCIFEROL, PO Take 2,000 Units by mouth. D3, daily in morning    . warfarin (COUMADIN) 3 MG tablet Take 3 mg by mouth daily.      No current facility-administered medications on file prior to visit.     No Known Allergies  Past Medical History:  Diagnosis Date  . Aortic insufficiency   . Atrial fibrillation ([place])   . Barrett esophagus   . Chronic anticoagulation   . Diabetes mellitus   . Dizziness   . Dyspnea   . GERD (gastroesophageal reflux disease)   . Hiatal hernia   . Hyperlipidemia   . Hyperplastic colon polyp 2006  . Hypertension   . Osteopenia   . Sinus bradycardia 05/05/2013  . Sleep apnea     Past Surgical History:  Procedure Laterality Date  . BLADDER SURGERY    . CARDIOVASCULAR STRESS TEST  04/19/2010   EF 72%  . CARDIOVERSION  2004  . CARPAL TUNNEL RELEASE    . COLON SURGERY    . COLONOSCOPY  08/23/2009   Normal   . EYE SURGERY     MULTIPLE WITH ENUCLEATION ON THE LEFT  . PROSTATE SURGERY    . SHOULDER  ARTHROSCOPY     RIGHT SHOULDER  . TRANSTHORACIC ECHOCARDIOGRAM  04/19/2010   EF 55-60%  . TUMOR REMOVAL FROM STOMACH    . US ECHOCARDIOGRAPHY  03/31/2003   EF 60-65%    History  Smoking Status  . Current Every Day Smoker  . Types: Cigars  Smokeless Tobacco  . Never Used    History  Alcohol Use No    Family History  Problem Relation Age of Onset  . Heart failure Mother   . Atrial fibrillation Mother   . Heart attack Father     X2  . Heart attack Sister 16    Review of Systems: As noted in history of present illness.   All other systems were reviewed and are negative.  Physical Exam: BP (!) 152/70   Pulse 63   Ht 5\' 10"  (1.778 m)   Wt 230 lb 6.4 oz (104.5 kg)   BMI 33.06 kg/m  He is a pleasant, elderly white male in no acute distress.  The HEENT exam is normal.  The carotids are 2+ without bruits.  There is no thyromegaly.  There is no JVD.  The lungs are clear.    The heart exam reveals an irregular rate  with a normal S1 and S2.  There are no murmurs, gallops, or rubs.  The PMI is not displaced.   Abdominal exam reveals good bowel sounds.    Exam of the legs trace edema.  Chronic hyperpigmentation. The distal pulses are intact.  Cranial nerves II - XII are intact.  LABORATORY DATA: Echo:Study Conclusions 07/06/13:  - Left ventricle: The cavity size was normal. Wall thickness was increased in a pattern of mild LVH. There was moderate focal basal hypertrophy. Systolic function was normal. The estimated ejection fraction was in the range of 55% to 60%. - Aortic valve: Mild regurgitation. - Mitral valve: Calcified annulus. Mildly thickened leaflets . Mild regurgitation. - Left atrium: The atrium was mildly dilated. - Right atrium: The atrium was mildly dilated. - Atrial septum: No defect or patent foramen ovale was identified. - Impressions: Restrictive mitral inflows suggesting high LA pressures Impressions:  - Restrictive mitral inflows suggesting high LA pressures   Labs reviewed from 10/18/14: cholesterol 128, triglycerides 114, HDL 36, LDL 69. CMET normal. A1c dated 11/17/15: 6.8%.   Ecg today shows Afib with rate 63. Nonspecific ST-T changes. I have personally reviewed and interpreted this study.  Assessment / Plan: 1. Atrial fibrillation.  Continue rate control and anticoagulation. He is asymptomatic and functioning at a high level.   2. Hypertension, mildly elevated. Recommend reduction in sodium intake and weight loss.  3. Diabetes mellitus now on insulin with improved control.  4. Edema. Improved with  reducing amlodipine to 2.5 mg daily. Continue lasix 40 mg daily. Needs to do a better job with sodium restriction.

## 2016-03-14 ENCOUNTER — Encounter: Payer: Self-pay | Admitting: Cardiology

## 2016-03-14 ENCOUNTER — Ambulatory Visit (INDEPENDENT_AMBULATORY_CARE_PROVIDER_SITE_OTHER): Payer: Medicare Other | Admitting: Cardiology

## 2016-03-14 VITALS — BP 152/70 | HR 63 | Ht 70.0 in | Wt 230.4 lb

## 2016-03-14 DIAGNOSIS — I482 Chronic atrial fibrillation, unspecified: Secondary | ICD-10-CM

## 2016-03-14 DIAGNOSIS — R600 Localized edema: Secondary | ICD-10-CM

## 2016-03-14 DIAGNOSIS — I10 Essential (primary) hypertension: Secondary | ICD-10-CM

## 2016-03-14 NOTE — Patient Instructions (Signed)
Continue your current therapy  You need to eliminate eating salty snacks  I will see you in 6 months.

## 2016-03-14 NOTE — Addendum Note (Signed)
Addended by: Kathyrn Lass on: 03/14/2016 09:14 AM   Modules accepted: Orders

## 2016-03-25 ENCOUNTER — Telehealth: Payer: Self-pay | Admitting: Cardiology

## 2016-03-25 NOTE — Telephone Encounter (Signed)
Returned call to patient. He explains that last week he saw PCP Dr. Philip Aspen, who had checked an A1C and gave patient goal to lose ~20 lbs. He prescribed phentermine for appetite suppression. Pt was advised by pharmacist who filled to contact Dr. Martinique for advice before taking. Concern for possible HTN or other SE's. Pt wants Dr. Doug Sou recommendation on whether to take or not take. Pt notes that Dr. Philip Aspen had considered other medications for this purpose, but they all were more expensive and had bad SE's. Pt is aware I will route for Dr. Doug Sou review and return his call.

## 2016-03-25 NOTE — Telephone Encounter (Signed)
New message    Pt C/O medication issue:  1. Name of Medication: phentermine   2. How are you currently taking this medication (dosage and times per day)? 30 mg capsule - one morning to reduce appetite   3. Are you having a reaction (difficulty breathing--STAT)? No   4. What is your medication issue? Was advise by pharmacy to discuss with Dr. Martinique.

## 2016-03-25 NOTE — Telephone Encounter (Signed)
I think it is reasonable to try phentermine. Will need to monitor BP and HR and notify Dr. Philip Aspen if he develops any side effects  Marti Acebo Martin[place] MD, [facility]

## 2016-03-26 NOTE — Telephone Encounter (Signed)
Received call from patient.Dr.Jordan's recommendations given. 

## 2016-03-26 NOTE — Telephone Encounter (Signed)
Returned call to patient.No answer.LMTC. 

## 2016-04-15 ENCOUNTER — Ambulatory Visit
Admission: RE | Admit: 2016-04-15 | Discharge: 2016-04-15 | Disposition: A | Discharge status: Home / Self Care | Payer: Medicare Other | Source: Ambulatory Visit | Attending: Urology | Admitting: Urology

## 2016-04-15 DIAGNOSIS — E559 Vitamin D deficiency, unspecified: Secondary | ICD-10-CM

## 2016-04-15 DIAGNOSIS — M8580 Other specified disorders of bone density and structure, unspecified site: Secondary | ICD-10-CM

## 2016-06-19 DIAGNOSIS — E1129 Type 2 diabetes mellitus with other diabetic kidney complication: Secondary | ICD-10-CM | POA: Diagnosis not present

## 2016-06-19 DIAGNOSIS — Z7901 Long term (current) use of anticoagulants: Secondary | ICD-10-CM | POA: Diagnosis not present

## 2016-06-19 DIAGNOSIS — I480 Paroxysmal atrial fibrillation: Secondary | ICD-10-CM | POA: Diagnosis not present

## 2016-06-27 DIAGNOSIS — E1129 Type 2 diabetes mellitus with other diabetic kidney complication: Secondary | ICD-10-CM | POA: Diagnosis not present

## 2016-06-27 DIAGNOSIS — N183 Chronic kidney disease, stage 3 (moderate): Secondary | ICD-10-CM | POA: Diagnosis not present

## 2016-06-27 DIAGNOSIS — R8299 Other abnormal findings in urine: Secondary | ICD-10-CM | POA: Diagnosis not present

## 2016-06-27 DIAGNOSIS — E784 Other hyperlipidemia: Secondary | ICD-10-CM | POA: Diagnosis not present

## 2016-06-27 DIAGNOSIS — Z125 Encounter for screening for malignant neoplasm of prostate: Secondary | ICD-10-CM | POA: Diagnosis not present

## 2016-07-11 DIAGNOSIS — G6289 Other specified polyneuropathies: Secondary | ICD-10-CM | POA: Diagnosis not present

## 2016-07-11 DIAGNOSIS — R0609 Other forms of dyspnea: Secondary | ICD-10-CM | POA: Diagnosis not present

## 2016-07-11 DIAGNOSIS — Z0000 Encounter for general adult medical examination without abnormal findings: Secondary | ICD-10-CM | POA: Diagnosis not present

## 2016-07-11 DIAGNOSIS — Z1389 Encounter for screening for other disorder: Secondary | ICD-10-CM | POA: Diagnosis not present

## 2016-07-11 DIAGNOSIS — Z6832 Body mass index (BMI) 32.0-32.9, adult: Secondary | ICD-10-CM | POA: Diagnosis not present

## 2016-07-11 DIAGNOSIS — N183 Chronic kidney disease, stage 3 (moderate): Secondary | ICD-10-CM | POA: Diagnosis not present

## 2016-07-11 DIAGNOSIS — I480 Paroxysmal atrial fibrillation: Secondary | ICD-10-CM | POA: Diagnosis not present

## 2016-07-11 DIAGNOSIS — Z7901 Long term (current) use of anticoagulants: Secondary | ICD-10-CM | POA: Diagnosis not present

## 2016-07-11 DIAGNOSIS — R42 Dizziness and giddiness: Secondary | ICD-10-CM | POA: Diagnosis not present

## 2016-07-11 DIAGNOSIS — E1129 Type 2 diabetes mellitus with other diabetic kidney complication: Secondary | ICD-10-CM | POA: Diagnosis not present

## 2016-07-11 DIAGNOSIS — I10 Essential (primary) hypertension: Secondary | ICD-10-CM | POA: Diagnosis not present

## 2016-07-11 DIAGNOSIS — G4733 Obstructive sleep apnea (adult) (pediatric): Secondary | ICD-10-CM | POA: Diagnosis not present

## 2016-07-23 DIAGNOSIS — Z7901 Long term (current) use of anticoagulants: Secondary | ICD-10-CM | POA: Diagnosis not present

## 2016-07-23 DIAGNOSIS — I480 Paroxysmal atrial fibrillation: Secondary | ICD-10-CM | POA: Diagnosis not present

## 2016-07-23 DIAGNOSIS — E1129 Type 2 diabetes mellitus with other diabetic kidney complication: Secondary | ICD-10-CM | POA: Diagnosis not present

## 2016-08-21 DIAGNOSIS — I480 Paroxysmal atrial fibrillation: Secondary | ICD-10-CM | POA: Diagnosis not present

## 2016-08-21 DIAGNOSIS — Z7901 Long term (current) use of anticoagulants: Secondary | ICD-10-CM | POA: Diagnosis not present

## 2016-08-26 DIAGNOSIS — L309 Dermatitis, unspecified: Secondary | ICD-10-CM | POA: Diagnosis not present

## 2016-08-26 DIAGNOSIS — D1801 Hemangioma of skin and subcutaneous tissue: Secondary | ICD-10-CM | POA: Diagnosis not present

## 2016-08-26 DIAGNOSIS — L821 Other seborrheic keratosis: Secondary | ICD-10-CM | POA: Diagnosis not present

## 2016-08-26 DIAGNOSIS — D225 Melanocytic nevi of trunk: Secondary | ICD-10-CM | POA: Diagnosis not present

## 2016-08-29 ENCOUNTER — Encounter: Payer: Self-pay | Admitting: Cardiology

## 2016-08-29 ENCOUNTER — Ambulatory Visit (INDEPENDENT_AMBULATORY_CARE_PROVIDER_SITE_OTHER): Payer: PPO | Admitting: Cardiology

## 2016-08-29 VITALS — BP 138/66 | HR 58 | Ht 70.0 in | Wt 224.0 lb

## 2016-08-29 DIAGNOSIS — I10 Essential (primary) hypertension: Secondary | ICD-10-CM

## 2016-08-29 DIAGNOSIS — I482 Chronic atrial fibrillation, unspecified: Secondary | ICD-10-CM

## 2016-08-29 DIAGNOSIS — R600 Localized edema: Secondary | ICD-10-CM | POA: Diagnosis not present

## 2016-08-29 NOTE — Progress Notes (Signed)
[name] Date of Birth: Mar 24, 1932 Medical Record #[id number]  History of Present Illness: [name] is seen for  followup of Afib. He has a history of atrial fibrillation and underwent cardioversion in 2005. His atrial fibrillation recurred and he was placed on amiodarone which converted him  medically. When seen in November 2014 he had severe bradycardia with HR in the low 40s. Amiodarone was stopped. About 3 weeks later he was in atrial fibrillation with HR 66 bpm. He is on coumadin. He has since been treated with rate control and anticoagulation.   On follow up today he  denies chest pain. No palpitations.   Edema has been well contolled.  He does go to the fitness center 5 days/week and exercises for one hour. When he is walking on the treadmill his HR will go over 100. He has been experiencing dizziness or lightheadedness in the late morning when going to lunch. He typically gets up in the am, takes his insulin, eats a bowl of cereal, then goes to work out for one hour. The dizziness always occurs when walking to lunch. Never at other times of day. He has not checked his BS then. He always feels better after eating.  Current Outpatient Prescriptions on File Prior to Visit  Medication Sig Dispense Refill  . alendronate (FOSAMAX) 70 MG tablet Take 70 mg by mouth every 7 (seven) days. Take with a full glass of water on an empty stomach.     Marland Kitchen amLODipine (NORVASC) 5 MG tablet Take 2.5 mg by mouth daily.    . Calcium Carbonate-Vitamin D (CALCIUM + D PO) Take 1,200 mg by mouth.    Marland Kitchen CINNAMON PO Take 1,000 mg by mouth daily.      . ferrous sulfate 325 (65 FE) MG tablet Take 325 mg by mouth daily with breakfast.    . furosemide (LASIX) 40 MG tablet Take 40 mg by mouth daily.      Marland Kitchen gabapentin (NEURONTIN) 300 MG capsule Take 300 mg by mouth at bedtime.    . INSULIN ASPART [place] Inject 73 Units into the skin every morning.     Marland Kitchen losartan (COZAAR) 100 MG tablet Take 100 mg by mouth daily.    . NON  FORMULARY CPAP at night    . pantoprazole (PROTONIX) 40 MG tablet Take 1 tablet (40 mg total) by mouth daily. 30 tablet 5  . Potassium Chloride (KLOR-CON PO) Take 25 mg by mouth. Daily,morning    . pravastatin (PRAVACHOL) 40 MG tablet Take 40 mg by mouth daily.      Marland Kitchen warfarin (COUMADIN) 3 MG tablet Take 3 mg by mouth daily.      No current facility-administered medications on file prior to visit.     No Known Allergies  Past Medical History:  Diagnosis Date  . Aortic insufficiency   . Atrial fibrillation ([place])   . Barrett esophagus   . Chronic anticoagulation   . Diabetes mellitus   . Dizziness   . Dyspnea   . GERD (gastroesophageal reflux disease)   . Hiatal hernia   . Hyperlipidemia   . Hyperplastic colon polyp 2006  . Hypertension   . Osteopenia   . Sinus bradycardia 05/05/2013  . Sleep apnea     Past Surgical History:  Procedure Laterality Date  . BLADDER SURGERY    . CARDIOVASCULAR STRESS TEST  04/19/2010   EF 72%  . CARDIOVERSION  2004  . CARPAL TUNNEL RELEASE    . COLON SURGERY    .  COLONOSCOPY  08/23/2009   Normal   . EYE SURGERY     MULTIPLE WITH ENUCLEATION ON THE LEFT  . PROSTATE SURGERY    . SHOULDER ARTHROSCOPY     RIGHT SHOULDER  . TRANSTHORACIC ECHOCARDIOGRAM  04/19/2010   EF 55-60%  . TUMOR REMOVAL FROM STOMACH    . US ECHOCARDIOGRAPHY  03/31/2003   EF 60-65%    History  Smoking Status  . Current Every Day Smoker  . Types: Cigars  Smokeless Tobacco  . Never Used    History  Alcohol Use No    Family History  Problem Relation Age of Onset  . Heart failure Mother   . Atrial fibrillation Mother   . Heart attack Father     X2  . Heart attack Sister 11    Review of Systems: As noted in history of present illness.   All other systems were reviewed and are negative.  Physical Exam: BP 138/66   Ht 5\' 10"  (1.778 m)   Wt 224 lb (101.6 kg)   BMI 32.14 kg/m  He is a pleasant, elderly white male in no acute distress.  The HEENT exam  is normal.  The carotids are 2+ without bruits.  There is no thyromegaly.  There is no JVD.  The lungs are clear.    The heart exam reveals an irregular rate with a normal S1 and S2.  There are no murmurs, gallops, or rubs.  The PMI is not displaced.   Abdominal exam reveals good bowel sounds.    Exam of the legs trace edema.  Chronic hyperpigmentation. The distal pulses are intact.  Cranial nerves II - XII are intact.  LABORATORY DATA:  Labs reviewed from 10/18/14: cholesterol 128, triglycerides 114, HDL 36, LDL 69. CMET normal. A1c dated 11/17/15: 6.8%.   Ecg today shows Afib with rate 58. Nonspecific ST-T changes. I have personally reviewed and interpreted this study.  Assessment / Plan: 1. Atrial fibrillation. I don't think he is having any symptoms. HR seems to respond normally to exercise.  Continue anticoagulation. He is on no rate control therapy at this time. He is asymptomatic and functioning at a high level.   2. Hypertension, controlled on current therapy.  3. Diabetes mellitus now on insulin with improved control.  4. Edema. Improved with  reducing amlodipine to 2.5 mg daily. Continue lasix 40 mg daily. Needs to continue sodium restriction.   5. Dizziness. Based on history I think this is more likely related to low blood sugar. Recommend he record BS when he feels dizzy. If normal then check BP and pulse.

## 2016-08-29 NOTE — Patient Instructions (Signed)
Continue your current therapy  Check your blood sugar when you feel dizzy.   I will see you in 6 months.

## 2016-09-06 ENCOUNTER — Ambulatory Visit: Payer: PPO | Admitting: Cardiology

## 2016-09-11 DIAGNOSIS — Z4422 Encounter for fitting and adjustment of artificial left eye: Secondary | ICD-10-CM | POA: Diagnosis not present

## 2016-09-19 DIAGNOSIS — E1129 Type 2 diabetes mellitus with other diabetic kidney complication: Secondary | ICD-10-CM | POA: Diagnosis not present

## 2016-09-19 DIAGNOSIS — Z7901 Long term (current) use of anticoagulants: Secondary | ICD-10-CM | POA: Diagnosis not present

## 2016-09-19 DIAGNOSIS — I480 Paroxysmal atrial fibrillation: Secondary | ICD-10-CM | POA: Diagnosis not present

## 2016-10-15 DIAGNOSIS — H02105 Unspecified ectropion of left lower eyelid: Secondary | ICD-10-CM | POA: Diagnosis not present

## 2016-10-17 DIAGNOSIS — I480 Paroxysmal atrial fibrillation: Secondary | ICD-10-CM | POA: Diagnosis not present

## 2016-10-17 DIAGNOSIS — Z7901 Long term (current) use of anticoagulants: Secondary | ICD-10-CM | POA: Diagnosis not present

## 2016-10-17 DIAGNOSIS — E1129 Type 2 diabetes mellitus with other diabetic kidney complication: Secondary | ICD-10-CM | POA: Diagnosis not present

## 2016-11-12 DIAGNOSIS — Z7901 Long term (current) use of anticoagulants: Secondary | ICD-10-CM | POA: Diagnosis not present

## 2016-11-12 DIAGNOSIS — T85321A Displacement of prosthetic orbit of left eye, initial encounter: Secondary | ICD-10-CM | POA: Diagnosis not present

## 2016-11-12 DIAGNOSIS — I480 Paroxysmal atrial fibrillation: Secondary | ICD-10-CM | POA: Diagnosis not present

## 2016-11-21 DIAGNOSIS — H52201 Unspecified astigmatism, right eye: Secondary | ICD-10-CM | POA: Diagnosis not present

## 2016-11-21 DIAGNOSIS — H25811 Combined forms of age-related cataract, right eye: Secondary | ICD-10-CM | POA: Diagnosis not present

## 2016-11-21 DIAGNOSIS — H35371 Puckering of macula, right eye: Secondary | ICD-10-CM | POA: Diagnosis not present

## 2016-11-21 DIAGNOSIS — E119 Type 2 diabetes mellitus without complications: Secondary | ICD-10-CM | POA: Diagnosis not present

## 2016-11-26 DIAGNOSIS — T85321A Displacement of prosthetic orbit of left eye, initial encounter: Secondary | ICD-10-CM | POA: Diagnosis not present

## 2016-12-25 DIAGNOSIS — I480 Paroxysmal atrial fibrillation: Secondary | ICD-10-CM | POA: Diagnosis not present

## 2016-12-25 DIAGNOSIS — E1129 Type 2 diabetes mellitus with other diabetic kidney complication: Secondary | ICD-10-CM | POA: Diagnosis not present

## 2016-12-25 DIAGNOSIS — Z7901 Long term (current) use of anticoagulants: Secondary | ICD-10-CM | POA: Diagnosis not present

## 2017-01-06 ENCOUNTER — Telehealth: Payer: Self-pay | Admitting: Cardiology

## 2017-01-06 NOTE — Telephone Encounter (Signed)
He is cleared for skin graft. May stop Coumadin 5 days prior to surgery.  Jojo Pehl Martinique MD, [facility]

## 2017-01-06 NOTE — Telephone Encounter (Signed)
° °  [facility] Pre-operative Risk Assessment    Request for surgical clearance:  1. What type of surgery is being performed? Skin graph   2. When is this surgery scheduled? Friday   3. Are there any medications that need to be held prior to surgery and how long? Can patient hold blood thinner for 7 days prior to surgery?  4. Name of physician performing surgery? Dr. Kristeen Miss  5. What is your office phone and fax number? Office # [phone] Fax # [phone]  Stanton Kidney will be sending over fax for medical clearance as well.  Marjean Donna 01/06/2017, 9:20 AM  _________________________________________________________________   (provider comments below)

## 2017-01-06 NOTE — Telephone Encounter (Signed)
Clearance faxed to [facility] at fax # [phone].

## 2017-01-06 NOTE — Telephone Encounter (Signed)
Routed to MD for review.

## 2017-01-10 DIAGNOSIS — Z7901 Long term (current) use of anticoagulants: Secondary | ICD-10-CM | POA: Diagnosis not present

## 2017-01-10 DIAGNOSIS — T85321A Displacement of prosthetic orbit of left eye, initial encounter: Secondary | ICD-10-CM | POA: Diagnosis not present

## 2017-01-14 DIAGNOSIS — Z7901 Long term (current) use of anticoagulants: Secondary | ICD-10-CM | POA: Diagnosis not present

## 2017-01-14 DIAGNOSIS — E1129 Type 2 diabetes mellitus with other diabetic kidney complication: Secondary | ICD-10-CM | POA: Diagnosis not present

## 2017-01-14 DIAGNOSIS — I480 Paroxysmal atrial fibrillation: Secondary | ICD-10-CM | POA: Diagnosis not present

## 2017-01-27 DIAGNOSIS — Z7901 Long term (current) use of anticoagulants: Secondary | ICD-10-CM | POA: Diagnosis not present

## 2017-01-27 DIAGNOSIS — I480 Paroxysmal atrial fibrillation: Secondary | ICD-10-CM | POA: Diagnosis not present

## 2017-02-06 DIAGNOSIS — I480 Paroxysmal atrial fibrillation: Secondary | ICD-10-CM | POA: Diagnosis not present

## 2017-02-06 DIAGNOSIS — Z7901 Long term (current) use of anticoagulants: Secondary | ICD-10-CM | POA: Diagnosis not present

## 2017-03-13 DIAGNOSIS — Z4422 Encounter for fitting and adjustment of artificial left eye: Secondary | ICD-10-CM | POA: Diagnosis not present

## 2017-03-13 DIAGNOSIS — Z7901 Long term (current) use of anticoagulants: Secondary | ICD-10-CM | POA: Diagnosis not present

## 2017-03-13 DIAGNOSIS — E1129 Type 2 diabetes mellitus with other diabetic kidney complication: Secondary | ICD-10-CM | POA: Diagnosis not present

## 2017-03-13 DIAGNOSIS — I480 Paroxysmal atrial fibrillation: Secondary | ICD-10-CM | POA: Diagnosis not present

## 2017-03-25 NOTE — Progress Notes (Signed)
Anthony Ho Date of Birth: 1932/02/09 Medical Record #[id number]  History of Present Illness: Mr. Blahnik is seen for  followup of Afib. He has a history of atrial fibrillation and underwent cardioversion in 2005. His atrial fibrillation recurred and he was placed on amiodarone which converted him  medically. When seen in November 2014 he had severe bradycardia with HR in the low 40s. Amiodarone was stopped. About 3 weeks later he was in atrial fibrillation with HR 66 bpm. He is on coumadin. He has since been treated with rate control and anticoagulation.  On follow up today he  denies chest pain. No palpitations.   He has no lower extremity edema.   He does go to the gym 5 days/week and exercises for one hour. He still notes some lightheadedness which is unchanged.  He notes more SOB and his wife notes he breathes through his mouth now. Notes sugars are running higher at 190-200. He is getting fitted for a new left eye prosthesis and is having surgery on his socket so it will fit.   Current Outpatient Prescriptions on File Prior to Visit  Medication Sig Dispense Refill  . alendronate (FOSAMAX) 70 MG tablet Take 70 mg by mouth every 7 (seven) days. Take with a full glass of water on an empty stomach.     Marland Kitchen amLODipine (NORVASC) 5 MG tablet Take 2.5 mg by mouth daily.    . Calcium Carbonate-Vitamin D (CALCIUM + D PO) Take 1,200 mg by mouth.    Marland Kitchen CINNAMON PO Take 1,000 mg by mouth daily.      . Cyanocobalamin (VITAMIN B12 PO) Take 1 tablet by mouth daily.    . ferrous sulfate 325 (65 FE) MG tablet Take 325 mg by mouth daily with breakfast.    . furosemide (LASIX) 40 MG tablet Take 40 mg by mouth daily.      Marland Kitchen gabapentin (NEURONTIN) 300 MG capsule Take 300 mg by mouth at bedtime.    . INSULIN ASPART [place] Inject 73 Units into the skin every morning.     . Insulin Lispro (HUMALOG [place]) Inject 10 Units into the skin as needed.    Marland Kitchen losartan (COZAAR) 100 MG tablet Take 100 mg by mouth daily.    . NON  FORMULARY CPAP at night    . pantoprazole (PROTONIX) 40 MG tablet Take 1 tablet (40 mg total) by mouth daily. 30 tablet 5  . Potassium Chloride (KLOR-CON PO) Take 25 mg by mouth. Daily,morning    . pravastatin (PRAVACHOL) 40 MG tablet Take 40 mg by mouth daily.      Marland Kitchen warfarin (COUMADIN) 3 MG tablet Take 3 mg by mouth daily.      No current facility-administered medications on file prior to visit.     No Known Allergies  Past Medical History:  Diagnosis Date  . Aortic insufficiency   . Atrial fibrillation ([place])   . Barrett esophagus   . Chronic anticoagulation   . Diabetes mellitus   . Dizziness   . Dyspnea   . GERD (gastroesophageal reflux disease)   . Hiatal hernia   . Hyperlipidemia   . Hyperplastic colon polyp 2006  . Hypertension   . Osteopenia   . Sinus bradycardia 05/05/2013  . Sleep apnea     Past Surgical History:  Procedure Laterality Date  . BLADDER SURGERY    . CARDIOVASCULAR STRESS TEST  04/19/2010   EF 72%  . CARDIOVERSION  2004  . CARPAL TUNNEL RELEASE    .  COLON SURGERY    . COLONOSCOPY  08/23/2009   Normal   . EYE SURGERY     MULTIPLE WITH ENUCLEATION ON THE LEFT  . PROSTATE SURGERY    . SHOULDER ARTHROSCOPY     RIGHT SHOULDER  . TRANSTHORACIC ECHOCARDIOGRAM  04/19/2010   EF 55-60%  . TUMOR REMOVAL FROM STOMACH    . US ECHOCARDIOGRAPHY  03/31/2003   EF 60-65%    History  Smoking Status  . Current Every Day Smoker  . Types: Cigars  Smokeless Tobacco  . Never Used    History  Alcohol Use No    Family History  Problem Relation Age of Onset  . Heart failure Mother   . Atrial fibrillation Mother   . Heart attack Father        X2  . Heart attack Sister 62    Review of Systems: As noted in history of present illness.   All other systems were reviewed and are negative.  Physical Exam: BP (!) 152/70   Pulse 64   Ht 5\' 10"  (1.778 m)   Wt 227 lb 6.4 oz (103.1 kg)   BMI 32.63 kg/m  GENERAL:  Well appearing WM in NAD HEENT:  Left  eye patch on. Oropharynx is clear. NECK:  No jugular venous distention, carotid upstroke brisk and symmetric, no bruits, no thyromegaly or adenopathy LUNGS:  Clear to auscultation bilaterally CHEST:  Unremarkable HEART:  IRRR,  PMI not displaced or sustained,S1 and S2 within normal limits, no S3, no S4: no clicks, no rubs, no murmurs ABD:  Soft, nontender. BS +, no masses or bruits. No hepatomegaly, no splenomegaly EXT:  2 + pulses throughout, no edema, no cyanosis no clubbing SKIN:  Warm and dry.  No rashes NEURO:  Alert and oriented x 3. Cranial nerves II through XII intact. PSYCH:  Cognitively intact    LABORATORY DATA:  Labs reviewed from 10/18/14: cholesterol 128, triglycerides 114, HDL 36, LDL 69. CMET normal. A1c dated 11/17/15: 6.8%.  Dated 06/27/16: cholesterol 149, triglycerides 182, HDL 35, LDL 78. Creatinine 1.5. Other chemistries normal. Hgb 15.1.  Dated 12/25/16: A1c 6.3%.   Assessment / Plan: 1. Atrial fibrillation. Chronic. HR seems to respond normally to exercise.  Continue anticoagulation. He is on no rate control therapy at this time.   2. Hypertension, systolic BP is elevated today but diastolic is relatively low- 60 mm Hg on my exam. I am reluctant to increase his medication further with his lightheadedness.   3. Diabetes mellitus- states he hasn't been using insulin but with elevated BS is planning to start again.  4. Dyspnea. No overt CHF. HR well controlled. Will update Echo.   5. Lightheadedness. Chronic.   I will follow up in 6 months.

## 2017-03-28 ENCOUNTER — Encounter: Payer: Self-pay | Admitting: Cardiology

## 2017-03-28 ENCOUNTER — Ambulatory Visit (INDEPENDENT_AMBULATORY_CARE_PROVIDER_SITE_OTHER): Payer: PPO | Admitting: Cardiology

## 2017-03-28 VITALS — BP 152/70 | HR 64 | Ht 70.0 in | Wt 227.4 lb

## 2017-03-28 DIAGNOSIS — R0602 Shortness of breath: Secondary | ICD-10-CM

## 2017-03-28 DIAGNOSIS — I10 Essential (primary) hypertension: Secondary | ICD-10-CM | POA: Diagnosis not present

## 2017-03-28 DIAGNOSIS — I482 Chronic atrial fibrillation, unspecified: Secondary | ICD-10-CM

## 2017-03-28 NOTE — Patient Instructions (Signed)
We will schedule you for an Echocardiogram  Continue your current therapy  I will see you in 6 months

## 2017-04-04 DIAGNOSIS — T85321A Displacement of prosthetic orbit of left eye, initial encounter: Secondary | ICD-10-CM | POA: Diagnosis not present

## 2017-04-04 DIAGNOSIS — T82321A Displacement of carotid arterial graft (bypass), initial encounter: Secondary | ICD-10-CM | POA: Diagnosis not present

## 2017-04-04 DIAGNOSIS — H02133 Senile ectropion of right eye, unspecified eyelid: Secondary | ICD-10-CM | POA: Diagnosis not present

## 2017-04-07 ENCOUNTER — Other Ambulatory Visit: Payer: Self-pay

## 2017-04-07 ENCOUNTER — Ambulatory Visit (HOSPITAL_COMMUNITY): Payer: PPO | Attending: Cardiology

## 2017-04-07 ENCOUNTER — Other Ambulatory Visit: Payer: Self-pay | Admitting: Internal Medicine

## 2017-04-07 DIAGNOSIS — Z6833 Body mass index (BMI) 33.0-33.9, adult: Secondary | ICD-10-CM | POA: Diagnosis not present

## 2017-04-07 DIAGNOSIS — R319 Hematuria, unspecified: Secondary | ICD-10-CM

## 2017-04-07 DIAGNOSIS — E1129 Type 2 diabetes mellitus with other diabetic kidney complication: Secondary | ICD-10-CM | POA: Diagnosis not present

## 2017-04-07 DIAGNOSIS — R0602 Shortness of breath: Secondary | ICD-10-CM | POA: Diagnosis not present

## 2017-04-07 DIAGNOSIS — I083 Combined rheumatic disorders of mitral, aortic and tricuspid valves: Secondary | ICD-10-CM | POA: Insufficient documentation

## 2017-04-07 DIAGNOSIS — I482 Chronic atrial fibrillation, unspecified: Secondary | ICD-10-CM

## 2017-04-07 DIAGNOSIS — I119 Hypertensive heart disease without heart failure: Secondary | ICD-10-CM | POA: Insufficient documentation

## 2017-04-07 DIAGNOSIS — I10 Essential (primary) hypertension: Secondary | ICD-10-CM

## 2017-04-07 DIAGNOSIS — G4730 Sleep apnea, unspecified: Secondary | ICD-10-CM | POA: Insufficient documentation

## 2017-04-07 DIAGNOSIS — G4733 Obstructive sleep apnea (adult) (pediatric): Secondary | ICD-10-CM | POA: Diagnosis not present

## 2017-04-07 DIAGNOSIS — R3129 Other microscopic hematuria: Secondary | ICD-10-CM | POA: Diagnosis not present

## 2017-04-07 DIAGNOSIS — I272 Pulmonary hypertension, unspecified: Secondary | ICD-10-CM | POA: Diagnosis not present

## 2017-04-07 DIAGNOSIS — N183 Chronic kidney disease, stage 3 (moderate): Secondary | ICD-10-CM | POA: Diagnosis not present

## 2017-04-07 DIAGNOSIS — Z7901 Long term (current) use of anticoagulants: Secondary | ICD-10-CM | POA: Diagnosis not present

## 2017-04-07 DIAGNOSIS — M5489 Other dorsalgia: Secondary | ICD-10-CM | POA: Diagnosis not present

## 2017-04-09 ENCOUNTER — Ambulatory Visit
Admission: RE | Admit: 2017-04-09 | Discharge: 2017-04-09 | Disposition: A | Discharge status: Home / Self Care | Payer: PPO | Source: Ambulatory Visit | Attending: Internal Medicine | Admitting: Internal Medicine

## 2017-04-09 DIAGNOSIS — K449 Diaphragmatic hernia without obstruction or gangrene: Secondary | ICD-10-CM | POA: Diagnosis not present

## 2017-04-09 DIAGNOSIS — R319 Hematuria, unspecified: Secondary | ICD-10-CM

## 2017-04-11 DIAGNOSIS — I480 Paroxysmal atrial fibrillation: Secondary | ICD-10-CM | POA: Diagnosis not present

## 2017-04-11 DIAGNOSIS — Z7901 Long term (current) use of anticoagulants: Secondary | ICD-10-CM | POA: Diagnosis not present

## 2017-04-17 DIAGNOSIS — I480 Paroxysmal atrial fibrillation: Secondary | ICD-10-CM | POA: Diagnosis not present

## 2017-04-17 DIAGNOSIS — Z7901 Long term (current) use of anticoagulants: Secondary | ICD-10-CM | POA: Diagnosis not present

## 2017-04-21 DIAGNOSIS — N401 Enlarged prostate with lower urinary tract symptoms: Secondary | ICD-10-CM | POA: Diagnosis not present

## 2017-04-21 DIAGNOSIS — M8580 Other specified disorders of bone density and structure, unspecified site: Secondary | ICD-10-CM | POA: Diagnosis not present

## 2017-04-21 DIAGNOSIS — R351 Nocturia: Secondary | ICD-10-CM | POA: Diagnosis not present

## 2017-04-21 DIAGNOSIS — R310 Gross hematuria: Secondary | ICD-10-CM | POA: Diagnosis not present

## 2017-04-21 DIAGNOSIS — D303 Benign neoplasm of bladder: Secondary | ICD-10-CM | POA: Diagnosis not present

## 2017-05-07 DIAGNOSIS — I480 Paroxysmal atrial fibrillation: Secondary | ICD-10-CM | POA: Diagnosis not present

## 2017-05-07 DIAGNOSIS — E1129 Type 2 diabetes mellitus with other diabetic kidney complication: Secondary | ICD-10-CM | POA: Diagnosis not present

## 2017-05-07 DIAGNOSIS — Z7901 Long term (current) use of anticoagulants: Secondary | ICD-10-CM | POA: Diagnosis not present

## 2017-05-14 DIAGNOSIS — Z7901 Long term (current) use of anticoagulants: Secondary | ICD-10-CM | POA: Diagnosis not present

## 2017-05-14 DIAGNOSIS — I10 Essential (primary) hypertension: Secondary | ICD-10-CM | POA: Diagnosis not present

## 2017-05-14 DIAGNOSIS — E1129 Type 2 diabetes mellitus with other diabetic kidney complication: Secondary | ICD-10-CM | POA: Diagnosis not present

## 2017-05-14 DIAGNOSIS — R05 Cough: Secondary | ICD-10-CM | POA: Diagnosis not present

## 2017-05-14 DIAGNOSIS — I480 Paroxysmal atrial fibrillation: Secondary | ICD-10-CM | POA: Diagnosis not present

## 2017-05-21 DIAGNOSIS — I480 Paroxysmal atrial fibrillation: Secondary | ICD-10-CM | POA: Diagnosis not present

## 2017-05-21 DIAGNOSIS — Z7901 Long term (current) use of anticoagulants: Secondary | ICD-10-CM | POA: Diagnosis not present

## 2017-06-18 DIAGNOSIS — E1129 Type 2 diabetes mellitus with other diabetic kidney complication: Secondary | ICD-10-CM | POA: Diagnosis not present

## 2017-06-18 DIAGNOSIS — I480 Paroxysmal atrial fibrillation: Secondary | ICD-10-CM | POA: Diagnosis not present

## 2017-06-18 DIAGNOSIS — Z7901 Long term (current) use of anticoagulants: Secondary | ICD-10-CM | POA: Diagnosis not present

## 2017-07-04 DIAGNOSIS — E1129 Type 2 diabetes mellitus with other diabetic kidney complication: Secondary | ICD-10-CM | POA: Diagnosis not present

## 2017-07-04 DIAGNOSIS — Z125 Encounter for screening for malignant neoplasm of prostate: Secondary | ICD-10-CM | POA: Diagnosis not present

## 2017-07-04 DIAGNOSIS — N183 Chronic kidney disease, stage 3 (moderate): Secondary | ICD-10-CM | POA: Diagnosis not present

## 2017-07-04 DIAGNOSIS — I480 Paroxysmal atrial fibrillation: Secondary | ICD-10-CM | POA: Diagnosis not present

## 2017-07-04 DIAGNOSIS — R82998 Other abnormal findings in urine: Secondary | ICD-10-CM | POA: Diagnosis not present

## 2017-07-11 DIAGNOSIS — E1129 Type 2 diabetes mellitus with other diabetic kidney complication: Secondary | ICD-10-CM | POA: Diagnosis not present

## 2017-07-11 DIAGNOSIS — R0609 Other forms of dyspnea: Secondary | ICD-10-CM | POA: Diagnosis not present

## 2017-07-11 DIAGNOSIS — G6289 Other specified polyneuropathies: Secondary | ICD-10-CM | POA: Diagnosis not present

## 2017-07-11 DIAGNOSIS — H6123 Impacted cerumen, bilateral: Secondary | ICD-10-CM | POA: Diagnosis not present

## 2017-07-11 DIAGNOSIS — Z6832 Body mass index (BMI) 32.0-32.9, adult: Secondary | ICD-10-CM | POA: Diagnosis not present

## 2017-07-11 DIAGNOSIS — N183 Chronic kidney disease, stage 3 (moderate): Secondary | ICD-10-CM | POA: Diagnosis not present

## 2017-07-11 DIAGNOSIS — G4733 Obstructive sleep apnea (adult) (pediatric): Secondary | ICD-10-CM | POA: Diagnosis not present

## 2017-07-11 DIAGNOSIS — Z1389 Encounter for screening for other disorder: Secondary | ICD-10-CM | POA: Diagnosis not present

## 2017-07-11 DIAGNOSIS — I480 Paroxysmal atrial fibrillation: Secondary | ICD-10-CM | POA: Diagnosis not present

## 2017-07-11 DIAGNOSIS — E1121 Type 2 diabetes mellitus with diabetic nephropathy: Secondary | ICD-10-CM | POA: Diagnosis not present

## 2017-07-11 DIAGNOSIS — I10 Essential (primary) hypertension: Secondary | ICD-10-CM | POA: Diagnosis not present

## 2017-07-11 DIAGNOSIS — Z0000 Encounter for general adult medical examination without abnormal findings: Secondary | ICD-10-CM | POA: Diagnosis not present

## 2017-07-15 DIAGNOSIS — Z1212 Encounter for screening for malignant neoplasm of rectum: Secondary | ICD-10-CM | POA: Diagnosis not present

## 2017-07-22 DIAGNOSIS — G9009 Other idiopathic peripheral autonomic neuropathy: Secondary | ICD-10-CM | POA: Diagnosis not present

## 2017-07-22 DIAGNOSIS — R2689 Other abnormalities of gait and mobility: Secondary | ICD-10-CM | POA: Diagnosis not present

## 2017-07-24 DIAGNOSIS — E1129 Type 2 diabetes mellitus with other diabetic kidney complication: Secondary | ICD-10-CM | POA: Diagnosis not present

## 2017-07-24 DIAGNOSIS — I480 Paroxysmal atrial fibrillation: Secondary | ICD-10-CM | POA: Diagnosis not present

## 2017-07-24 DIAGNOSIS — Z7901 Long term (current) use of anticoagulants: Secondary | ICD-10-CM | POA: Diagnosis not present

## 2017-08-25 DIAGNOSIS — H25811 Combined forms of age-related cataract, right eye: Secondary | ICD-10-CM | POA: Diagnosis not present

## 2017-08-25 DIAGNOSIS — H35371 Puckering of macula, right eye: Secondary | ICD-10-CM | POA: Diagnosis not present

## 2017-08-25 DIAGNOSIS — E119 Type 2 diabetes mellitus without complications: Secondary | ICD-10-CM | POA: Diagnosis not present

## 2017-09-30 DIAGNOSIS — Z7901 Long term (current) use of anticoagulants: Secondary | ICD-10-CM | POA: Diagnosis not present

## 2017-09-30 DIAGNOSIS — E1129 Type 2 diabetes mellitus with other diabetic kidney complication: Secondary | ICD-10-CM | POA: Diagnosis not present

## 2017-09-30 DIAGNOSIS — I480 Paroxysmal atrial fibrillation: Secondary | ICD-10-CM | POA: Diagnosis not present

## 2017-11-06 DIAGNOSIS — R0609 Other forms of dyspnea: Secondary | ICD-10-CM | POA: Diagnosis not present

## 2017-11-06 DIAGNOSIS — Z6832 Body mass index (BMI) 32.0-32.9, adult: Secondary | ICD-10-CM | POA: Diagnosis not present

## 2017-11-06 DIAGNOSIS — Z7901 Long term (current) use of anticoagulants: Secondary | ICD-10-CM | POA: Diagnosis not present

## 2017-11-06 DIAGNOSIS — I10 Essential (primary) hypertension: Secondary | ICD-10-CM | POA: Diagnosis not present

## 2017-11-06 DIAGNOSIS — G4733 Obstructive sleep apnea (adult) (pediatric): Secondary | ICD-10-CM | POA: Diagnosis not present

## 2017-11-06 DIAGNOSIS — Z794 Long term (current) use of insulin: Secondary | ICD-10-CM | POA: Diagnosis not present

## 2017-11-06 DIAGNOSIS — I480 Paroxysmal atrial fibrillation: Secondary | ICD-10-CM | POA: Diagnosis not present

## 2017-11-06 DIAGNOSIS — E1129 Type 2 diabetes mellitus with other diabetic kidney complication: Secondary | ICD-10-CM | POA: Diagnosis not present

## 2017-11-11 ENCOUNTER — Other Ambulatory Visit (HOSPITAL_COMMUNITY): Payer: Self-pay | Admitting: Respiratory Therapy

## 2017-11-11 DIAGNOSIS — R0600 Dyspnea, unspecified: Secondary | ICD-10-CM

## 2017-11-11 DIAGNOSIS — R0609 Other forms of dyspnea: Principal | ICD-10-CM

## 2017-11-17 ENCOUNTER — Ambulatory Visit (HOSPITAL_COMMUNITY)
Admission: RE | Admit: 2017-11-17 | Discharge: 2017-11-17 | Disposition: A | Discharge status: Home / Self Care | Payer: PPO | Source: Ambulatory Visit | Attending: Internal Medicine | Admitting: Internal Medicine

## 2017-11-17 DIAGNOSIS — R0600 Dyspnea, unspecified: Secondary | ICD-10-CM

## 2017-11-17 DIAGNOSIS — R0609 Other forms of dyspnea: Secondary | ICD-10-CM | POA: Insufficient documentation

## 2017-11-17 DIAGNOSIS — J449 Chronic obstructive pulmonary disease, unspecified: Secondary | ICD-10-CM | POA: Diagnosis not present

## 2017-11-17 DIAGNOSIS — Z87891 Personal history of nicotine dependence: Secondary | ICD-10-CM | POA: Insufficient documentation

## 2017-11-17 DIAGNOSIS — R918 Other nonspecific abnormal finding of lung field: Secondary | ICD-10-CM | POA: Insufficient documentation

## 2017-11-17 LAB — PULMONARY FUNCTION TEST
DL/VA % pred: 87 %
DL/VA: 3.99 ml/min/mmHg/L
DLCO unc % pred: 58 %
DLCO unc: 18.98 ml/min/mmHg
FEF 25-75 Post: 1.48 L/sec
FEF 25-75 Pre: 1.56 L/sec
FEF2575-%Change-Post: -5 %
FEF2575-%Pred-Post: 83 %
FEF2575-%Pred-Pre: 88 %
FEV1-%Change-Post: 0 %
FEV1-%Pred-Post: 78 %
FEV1-%Pred-Pre: 78 %
FEV1-Post: 2.13 L
FEV1-Pre: 2.11 L
FEV1FVC-%Change-Post: 6 %
FEV1FVC-%Pred-Pre: 102 %
FEV6-%Change-Post: -4 %
FEV6-%Pred-Post: 72 %
FEV6-%Pred-Pre: 75 %
FEV6-Post: 2.6 L
FEV6-Pre: 2.71 L
FEV6FVC-%Change-Post: 2 %
FEV6FVC-%Pred-Post: 102 %
FEV6FVC-%Pred-Pre: 100 %
FVC-%Change-Post: -5 %
FVC-%Pred-Post: 70 %
FVC-%Pred-Pre: 75 %
FVC-Post: 2.73 L
FVC-Pre: 2.9 L
Post FEV1/FVC ratio: 78 %
Post FEV6/FVC ratio: 96 %
Pre FEV1/FVC ratio: 73 %
Pre FEV6/FVC Ratio: 94 %
RV % pred: 84 %
RV: 2.31 L
TLC % pred: 75 %
TLC: 5.37 L

## 2017-11-17 MED ORDER — ALBUTEROL SULFATE (2.5 MG/3ML) 0.083% IN NEBU
2.5000 mg | INHALATION_SOLUTION | Freq: Once | RESPIRATORY_TRACT | Status: AC
  Administered 2017-11-17: 2.5 mg via RESPIRATORY_TRACT

## 2017-12-09 DIAGNOSIS — I480 Paroxysmal atrial fibrillation: Secondary | ICD-10-CM | POA: Diagnosis not present

## 2017-12-09 DIAGNOSIS — E1129 Type 2 diabetes mellitus with other diabetic kidney complication: Secondary | ICD-10-CM | POA: Diagnosis not present

## 2017-12-09 DIAGNOSIS — Z7901 Long term (current) use of anticoagulants: Secondary | ICD-10-CM | POA: Diagnosis not present

## 2017-12-17 DIAGNOSIS — Z0000 Encounter for general adult medical examination without abnormal findings: Secondary | ICD-10-CM | POA: Diagnosis not present

## 2018-01-07 DIAGNOSIS — Z7901 Long term (current) use of anticoagulants: Secondary | ICD-10-CM | POA: Diagnosis not present

## 2018-01-07 DIAGNOSIS — H5989 Other postprocedural complications and disorders of eye and adnexa, not elsewhere classified: Secondary | ICD-10-CM | POA: Diagnosis not present

## 2018-01-07 DIAGNOSIS — E1129 Type 2 diabetes mellitus with other diabetic kidney complication: Secondary | ICD-10-CM | POA: Diagnosis not present

## 2018-01-07 DIAGNOSIS — I480 Paroxysmal atrial fibrillation: Secondary | ICD-10-CM | POA: Diagnosis not present

## 2018-01-07 DIAGNOSIS — Q111 Other anophthalmos: Secondary | ICD-10-CM | POA: Diagnosis not present

## 2018-02-12 DIAGNOSIS — Z794 Long term (current) use of insulin: Secondary | ICD-10-CM | POA: Diagnosis not present

## 2018-02-12 DIAGNOSIS — R0609 Other forms of dyspnea: Secondary | ICD-10-CM | POA: Diagnosis not present

## 2018-02-12 DIAGNOSIS — Z6831 Body mass index (BMI) 31.0-31.9, adult: Secondary | ICD-10-CM | POA: Diagnosis not present

## 2018-02-12 DIAGNOSIS — E1129 Type 2 diabetes mellitus with other diabetic kidney complication: Secondary | ICD-10-CM | POA: Diagnosis not present

## 2018-02-12 DIAGNOSIS — G6289 Other specified polyneuropathies: Secondary | ICD-10-CM | POA: Diagnosis not present

## 2018-02-12 DIAGNOSIS — I480 Paroxysmal atrial fibrillation: Secondary | ICD-10-CM | POA: Diagnosis not present

## 2018-03-17 DIAGNOSIS — E1129 Type 2 diabetes mellitus with other diabetic kidney complication: Secondary | ICD-10-CM | POA: Diagnosis not present

## 2018-03-17 DIAGNOSIS — Z7901 Long term (current) use of anticoagulants: Secondary | ICD-10-CM | POA: Diagnosis not present

## 2018-03-17 DIAGNOSIS — N183 Chronic kidney disease, stage 3 (moderate): Secondary | ICD-10-CM | POA: Diagnosis not present

## 2018-03-17 DIAGNOSIS — K469 Unspecified abdominal hernia without obstruction or gangrene: Secondary | ICD-10-CM | POA: Diagnosis not present

## 2018-03-17 DIAGNOSIS — K529 Noninfective gastroenteritis and colitis, unspecified: Secondary | ICD-10-CM | POA: Diagnosis not present

## 2018-03-17 DIAGNOSIS — I10 Essential (primary) hypertension: Secondary | ICD-10-CM | POA: Diagnosis not present

## 2018-03-17 DIAGNOSIS — L299 Pruritus, unspecified: Secondary | ICD-10-CM | POA: Diagnosis not present

## 2018-03-17 DIAGNOSIS — Z6831 Body mass index (BMI) 31.0-31.9, adult: Secondary | ICD-10-CM | POA: Diagnosis not present

## 2018-03-17 DIAGNOSIS — I480 Paroxysmal atrial fibrillation: Secondary | ICD-10-CM | POA: Diagnosis not present

## 2018-04-01 DIAGNOSIS — C4441 Basal cell carcinoma of skin of scalp and neck: Secondary | ICD-10-CM | POA: Diagnosis not present

## 2018-04-01 DIAGNOSIS — D1801 Hemangioma of skin and subcutaneous tissue: Secondary | ICD-10-CM | POA: Diagnosis not present

## 2018-04-01 DIAGNOSIS — D485 Neoplasm of uncertain behavior of skin: Secondary | ICD-10-CM | POA: Diagnosis not present

## 2018-04-01 DIAGNOSIS — B353 Tinea pedis: Secondary | ICD-10-CM | POA: Diagnosis not present

## 2018-04-01 DIAGNOSIS — L298 Other pruritus: Secondary | ICD-10-CM | POA: Diagnosis not present

## 2018-04-01 DIAGNOSIS — L570 Actinic keratosis: Secondary | ICD-10-CM | POA: Diagnosis not present

## 2018-04-22 DIAGNOSIS — I480 Paroxysmal atrial fibrillation: Secondary | ICD-10-CM | POA: Diagnosis not present

## 2018-04-22 DIAGNOSIS — E1129 Type 2 diabetes mellitus with other diabetic kidney complication: Secondary | ICD-10-CM | POA: Diagnosis not present

## 2018-04-22 DIAGNOSIS — Z7901 Long term (current) use of anticoagulants: Secondary | ICD-10-CM | POA: Diagnosis not present

## 2018-04-23 DIAGNOSIS — E119 Type 2 diabetes mellitus without complications: Secondary | ICD-10-CM | POA: Diagnosis not present

## 2018-04-23 DIAGNOSIS — H35371 Puckering of macula, right eye: Secondary | ICD-10-CM | POA: Diagnosis not present

## 2018-04-23 DIAGNOSIS — H25811 Combined forms of age-related cataract, right eye: Secondary | ICD-10-CM | POA: Diagnosis not present

## 2018-04-23 DIAGNOSIS — H5211 Myopia, right eye: Secondary | ICD-10-CM | POA: Diagnosis not present

## 2018-04-28 ENCOUNTER — Telehealth: Payer: Self-pay | Admitting: Cardiology

## 2018-04-28 DIAGNOSIS — K22719 Barrett's esophagus with dysplasia, unspecified: Secondary | ICD-10-CM | POA: Diagnosis not present

## 2018-04-28 DIAGNOSIS — I10 Essential (primary) hypertension: Secondary | ICD-10-CM | POA: Diagnosis not present

## 2018-04-28 DIAGNOSIS — N183 Chronic kidney disease, stage 3 (moderate): Secondary | ICD-10-CM | POA: Diagnosis not present

## 2018-04-28 DIAGNOSIS — A4902 Methicillin resistant Staphylococcus aureus infection, unspecified site: Secondary | ICD-10-CM | POA: Diagnosis not present

## 2018-04-28 DIAGNOSIS — R001 Bradycardia, unspecified: Secondary | ICD-10-CM | POA: Diagnosis not present

## 2018-04-28 DIAGNOSIS — E1169 Type 2 diabetes mellitus with other specified complication: Secondary | ICD-10-CM | POA: Diagnosis not present

## 2018-04-28 DIAGNOSIS — I4891 Unspecified atrial fibrillation: Secondary | ICD-10-CM | POA: Diagnosis not present

## 2018-04-28 DIAGNOSIS — I351 Nonrheumatic aortic (valve) insufficiency: Secondary | ICD-10-CM | POA: Diagnosis not present

## 2018-04-28 DIAGNOSIS — R600 Localized edema: Secondary | ICD-10-CM | POA: Diagnosis not present

## 2018-04-28 DIAGNOSIS — G4733 Obstructive sleep apnea (adult) (pediatric): Secondary | ICD-10-CM | POA: Diagnosis not present

## 2018-04-28 DIAGNOSIS — K219 Gastro-esophageal reflux disease without esophagitis: Secondary | ICD-10-CM | POA: Diagnosis not present

## 2018-04-28 NOTE — Telephone Encounter (Signed)
Received call from patient-patient states he went for pre op testing today at [facility].  He is scheduled to have eye surgery on 11/26 by Dr. Algis Greenhouse.    He states they "got excited" today when they did an EKG and his HR was 41.  He states he "feels fine" but does report some SOB with exertion but this has been going on for a while.  Also reports some dizziness and states his PCP says he has an unsteady gait and recommended a cane.  He states he has been waiting for a year for this surgery and he cannot miss this.   He only has one eye and his prosthesis does not fit appropriately so they are having do some work to his eye socket in order for his new prosthesis to fit.     Advised to keep appt for tomorrow, note from pre admission testing today is available in Epic for review.      Patient is in no distress on the phone, does not check BP or HR at home.

## 2018-04-28 NOTE — Telephone Encounter (Signed)
Thank you :)

## 2018-04-29 ENCOUNTER — Ambulatory Visit (INDEPENDENT_AMBULATORY_CARE_PROVIDER_SITE_OTHER): Payer: PPO | Admitting: Adult Health

## 2018-04-29 ENCOUNTER — Encounter: Payer: Self-pay | Admitting: Adult Health

## 2018-04-29 VITALS — BP 130/78 | HR 56 | Ht 70.0 in | Wt 202.8 lb

## 2018-04-29 DIAGNOSIS — I519 Heart disease, unspecified: Secondary | ICD-10-CM

## 2018-04-29 DIAGNOSIS — I351 Nonrheumatic aortic (valve) insufficiency: Secondary | ICD-10-CM | POA: Diagnosis not present

## 2018-04-29 DIAGNOSIS — Z01810 Encounter for preprocedural cardiovascular examination: Secondary | ICD-10-CM | POA: Diagnosis not present

## 2018-04-29 DIAGNOSIS — I4891 Unspecified atrial fibrillation: Secondary | ICD-10-CM | POA: Diagnosis not present

## 2018-04-29 MED ORDER — AMLODIPINE BESYLATE 10 MG PO TABS: 10.0000 mg | ORAL_TABLET | Freq: Every day | ORAL | 5 refills | Status: DC

## 2018-04-29 NOTE — Patient Instructions (Addendum)
Medication Instructions:  STOP CARVEDILOL  INCREASE AMLODIPINE 10MG  DAILY  If you need a refill on your cardiac medications before your next appointment, please call your pharmacy.  Labwork: If you have labs (blood work) drawn today and your tests are completely normal, you will receive your results only by: Marland Kitchen MyChart Message (if you have MyChart) OR . A paper copy in the mail If you have any lab test that is abnormal or we need to change your treatment, we will call you to review the results.  Testing/Procedures: Echocardiogram(05-01-2018 @ 4PM) - Your physician has requested that you have an echocardiogram. Echocardiography is a painless test that uses sound waves to create images of your heart. It provides your doctor with information about the size and shape of your heart and how well your heart's chambers and valves are working. This procedure takes approximately one hour. There are no restrictions for this procedure. This will be performed at our [facility] location - [address].  Follow-Up: You will need a follow up appointment in 3 MONTHS.   You may see  DR Martinique,  Kathryn Lawrence, DNP, ANP or one of the following Advanced Practice Providers on your designated Care Team:    . Almyra Deforest, PA-C . Fabian Sharp, PA-C  At [facility], you and your health needs are our priority.  As part of our continuing mission to provide you with exceptional heart care, we have created designated Provider Care Teams.  These Care Teams include your primary Cardiologist (physician) and Advanced Practice Providers (APPs -  Physician Assistants and Nurse Practitioners) who all work together to provide you with the care you need, when you need it.  Thank you for choosing [facility] at [facility]!!

## 2018-04-29 NOTE — Progress Notes (Addendum)
Cardiology Office Note   Date:  04/29/2018   ID:  Anthony Ho, DOB [birth year], MRN [id number]  PCP:  Leanna Battles, MD  Cardiologist:  Dr. Martinique   Chief Complaint  Patient presents with  . Bradycardia     History of Present Illness: Anthony Ho is a 84 y.o. male who presents for ongoing assessment and management of atrial fibrillation on amiodarone, which converted him pharmacologically. He has a history of severe bradycardia with HR in 40's and amiodarone was stopped. On follow up he was found to be in atrial fibrillation with HR  66.He remains on coumadin and is followed by his PCP.    He was continued on coumadin. He is active and goes to the gym 5 days a week. He is to have left eye prosthesis with planned surgery at [facility]. Was seen by physician at [facility] who noted bradycardia, Dr. Algis Greenhouse. He asymptomatic but was told to follow up with cardiology prior to moving forward with eye surgery for surgical clearance.  Office number [phone]   He is frustrated that he is being delayed but is happy to have the clearance appointment. He denies chest pain, dizziness, DOE, or fatigue. He is tolerating all of his medications.    Past Medical History:  Diagnosis Date  . Aortic insufficiency   . Atrial fibrillation ([place])   . Barrett esophagus   . Chronic anticoagulation   . Diabetes mellitus   . Dizziness   . Dyspnea   . GERD (gastroesophageal reflux disease)   . Hiatal hernia   . Hyperlipidemia   . Hyperplastic colon polyp 2006  . Hypertension   . Osteopenia   . Sinus bradycardia 05/05/2013  . Sleep apnea     Past Surgical History:  Procedure Laterality Date  . BLADDER SURGERY    . CARDIOVASCULAR STRESS TEST  04/19/2010   EF 72%  . CARDIOVERSION  2004  . CARPAL TUNNEL RELEASE    . COLON SURGERY    . COLONOSCOPY  08/23/2009   Normal   . EYE SURGERY     MULTIPLE WITH ENUCLEATION ON THE LEFT  . PROSTATE SURGERY    . SHOULDER ARTHROSCOPY     RIGHT SHOULDER  .  TRANSTHORACIC ECHOCARDIOGRAM  04/19/2010   EF 55-60%  . TUMOR REMOVAL FROM STOMACH    . US ECHOCARDIOGRAPHY  03/31/2003   EF 60-65%     Current Outpatient Medications  Medication Sig Dispense Refill  . alendronate (FOSAMAX) 70 MG tablet Take 70 mg by mouth every 7 (seven) days. Take with a full glass of water on an empty stomach.     Marland Kitchen amLODipine (NORVASC) 10 MG tablet Take 1 tablet (10 mg total) by mouth daily. 30 tablet 5  . Calcium Carbonate-Vitamin D (CALCIUM + D PO) Take 1,200 mg by mouth.    Marland Kitchen CINNAMON PO Take 1,000 mg by mouth daily.      . Cyanocobalamin (VITAMIN B12 PO) Take 1 tablet by mouth daily.    . ferrous sulfate 325 (65 FE) MG tablet Take 325 mg by mouth daily with breakfast.    . furosemide (LASIX) 40 MG tablet Take 40 mg by mouth daily.      Marland Kitchen gabapentin (NEURONTIN) 300 MG capsule Take 300 mg by mouth at bedtime.    . INSULIN ASPART [place] Inject 73 Units into the skin every morning.     . Insulin Lispro (HUMALOG [place]) Inject 10 Units into the skin as needed.    Marland Kitchen  losartan (COZAAR) 100 MG tablet Take 100 mg by mouth daily.    . NON FORMULARY CPAP at night    . pantoprazole (PROTONIX) 40 MG tablet Take 1 tablet (40 mg total) by mouth daily. 30 tablet 5  . Potassium Chloride (KLOR-CON PO) Take 25 mg by mouth. Daily,morning    . pravastatin (PRAVACHOL) 40 MG tablet Take 40 mg by mouth daily.      Marland Kitchen warfarin (COUMADIN) 3 MG tablet Take 3 mg by mouth daily.      No current facility-administered medications for this visit.     Allergies:   Patient has no known allergies.    Social History:  The patient  reports that he has been smoking cigars. He has never used smokeless tobacco. He reports that he does not drink alcohol or use drugs.   Family History:  The patient's family history includes Atrial fibrillation in his mother; Heart attack in his father; Heart attack (age of onset: 73) in his sister; Heart failure in his mother.    ROS: All other systems are reviewed and  negative. Unless otherwise mentioned in H&P    PHYSICAL EXAM: VS:  BP 130/78   Pulse (!) 56   Ht 5\' 10"  (1.778 m)   Wt 202 lb 12.8 oz (92 kg)   SpO2 99%   BMI 29.10 kg/m  , BMI Body mass index is 29.1 kg/m. GEN: Well nourished, well developed, in no acute distress HEENT: Left eye is covered with bandage.  Neck: no JVD, carotid bruits, or masses Cardiac: IRRR; no murmurs, rubs, or gallops,no edema  Respiratory:  Clear to auscultation bilaterally, normal work of breathing GI: soft, nontender, nondistended, + BS MS: no deformity or atrophy Skin: warm and dry, no rash Neuro:  Strength and sensation are intact Psych: euthymic mood, full affect   EKG:   Atrial fibrillation with slow ventricular response. Rate of 58 bpm. Non-specific T wave abnormality.    EKG from [facility] (04/28/2018). Atrial fib slow ventricular response rate of 41 bpm. Non-specific T wave abnormalities.   Recent Labs: No results found for requested labs within last 8760 hours.    Lipid Panel No results found for: CHOL, TRIG, HDL, CHOLHDL, VLDL, LDLCALC, LDLDIRECT    Wt Readings from Last 3 Encounters:  04/29/18 202 lb 12.8 oz (92 kg)  03/28/17 227 lb 6.4 oz (103.1 kg)  08/29/16 224 lb (101.6 kg)     Other studies Reviewed: Echocardiogram April 11, 2017  Left ventricle: The cavity size was normal. There was severe   concentric hypertrophy. Systolic function was vigorous. The   estimated ejection fraction was in the range of 65% to 70%. Wall   motion was normal; there were no regional wall motion   abnormalities. The study is not technically sufficient to allow   evaluation of LV diastolic function. - Aortic valve: Trileaflet; mildly thickened, mildly calcified   leaflets. There was moderate regurgitation. - Aortic root: The aortic root was normal in size. - Mitral valve: Calcified annulus. There was mild regurgitation. - Left atrium: The atrium was severely dilated. - Right ventricle: The cavity  size was normal. Wall thickness was   normal. Systolic function was normal. - Right atrium: The atrium was severely dilated. - Tricuspid valve: There was moderate regurgitation. - Pulmonary arteries: Systolic pressure was moderately increased.   PA peak pressure: 43 mm Hg (S). - Inferior vena cava: The vessel was normal in size. The   respirophasic diameter changes were in the normal range (=  50%),   consistent with normal central venous pressure. - Pericardium, extracardiac: There was no pericardial effusion.   ASSESSMENT AND PLAN:  1. Atrial fib: Heart rate is slow and has been noted in the 40's. He will stop coreg 3/125 mg BID to allow his HR to rise some. He states before he started the coreg his HR was in the 60;s. He is to hold is coumadin as directed by his coumadin clinic prior to surgery.   2. Pre-Operative Clearance: He states that the [place] physicians would like to have a repeat echocardiogram He will have this completed om Friday, November 15. A copy will be sent to [facility].   Chart reviewed as part of pre-operative protocol coverage. Given past medical history and time since last visit, based on ACC/AHA guidelines, Anthony Ho would be at acceptable risk for the planned procedure. Echo is being repeated at the request of [place] physicians. Coumadin is managed through PCP and he has been given instructions concerning holding dose.   3. Hypertension: To prevent hypertension he will have amlodipine increased to 10 mg from 5 mg daily.    Current medicines are reviewed at length with the patient today.    Labs/ tests ordered today include: Echocardiogram  Phill Myron. [place] Pugh, ANP, AACC   04/29/2018 11:14 AM    [place] [address] Office [phone] Fax [phone]

## 2018-05-01 ENCOUNTER — Ambulatory Visit (HOSPITAL_COMMUNITY): Payer: PPO | Attending: Cardiovascular Disease

## 2018-05-01 ENCOUNTER — Other Ambulatory Visit: Payer: Self-pay

## 2018-05-01 DIAGNOSIS — I519 Heart disease, unspecified: Secondary | ICD-10-CM | POA: Diagnosis not present

## 2018-05-01 DIAGNOSIS — I4891 Unspecified atrial fibrillation: Secondary | ICD-10-CM | POA: Diagnosis not present

## 2018-05-01 DIAGNOSIS — I351 Nonrheumatic aortic (valve) insufficiency: Secondary | ICD-10-CM

## 2018-05-06 DIAGNOSIS — L309 Dermatitis, unspecified: Secondary | ICD-10-CM | POA: Diagnosis not present

## 2018-05-06 DIAGNOSIS — L570 Actinic keratosis: Secondary | ICD-10-CM | POA: Diagnosis not present

## 2018-05-12 ENCOUNTER — Ambulatory Visit (INDEPENDENT_AMBULATORY_CARE_PROVIDER_SITE_OTHER): Payer: PPO

## 2018-05-12 ENCOUNTER — Other Ambulatory Visit: Payer: Self-pay

## 2018-05-12 ENCOUNTER — Telehealth: Payer: Self-pay

## 2018-05-12 DIAGNOSIS — Z882 Allergy status to sulfonamides status: Secondary | ICD-10-CM | POA: Diagnosis not present

## 2018-05-12 DIAGNOSIS — N183 Chronic kidney disease, stage 3 (moderate): Secondary | ICD-10-CM | POA: Diagnosis not present

## 2018-05-12 DIAGNOSIS — I129 Hypertensive chronic kidney disease with stage 1 through stage 4 chronic kidney disease, or unspecified chronic kidney disease: Secondary | ICD-10-CM | POA: Diagnosis not present

## 2018-05-12 DIAGNOSIS — I4811 Longstanding persistent atrial fibrillation: Secondary | ICD-10-CM | POA: Diagnosis not present

## 2018-05-12 DIAGNOSIS — R001 Bradycardia, unspecified: Secondary | ICD-10-CM

## 2018-05-12 DIAGNOSIS — N400 Enlarged prostate without lower urinary tract symptoms: Secondary | ICD-10-CM | POA: Diagnosis not present

## 2018-05-12 DIAGNOSIS — H9193 Unspecified hearing loss, bilateral: Secondary | ICD-10-CM | POA: Diagnosis not present

## 2018-05-12 DIAGNOSIS — E1122 Type 2 diabetes mellitus with diabetic chronic kidney disease: Secondary | ICD-10-CM | POA: Diagnosis not present

## 2018-05-12 DIAGNOSIS — Z8614 Personal history of Methicillin resistant Staphylococcus aureus infection: Secondary | ICD-10-CM | POA: Diagnosis not present

## 2018-05-12 DIAGNOSIS — Z79899 Other long term (current) drug therapy: Secondary | ICD-10-CM | POA: Diagnosis not present

## 2018-05-12 DIAGNOSIS — I482 Chronic atrial fibrillation, unspecified: Secondary | ICD-10-CM | POA: Diagnosis not present

## 2018-05-12 DIAGNOSIS — F17210 Nicotine dependence, cigarettes, uncomplicated: Secondary | ICD-10-CM | POA: Diagnosis not present

## 2018-05-12 DIAGNOSIS — D696 Thrombocytopenia, unspecified: Secondary | ICD-10-CM | POA: Diagnosis not present

## 2018-05-12 DIAGNOSIS — M1990 Unspecified osteoarthritis, unspecified site: Secondary | ICD-10-CM | POA: Diagnosis not present

## 2018-05-12 DIAGNOSIS — Z9911 Dependence on respirator [ventilator] status: Secondary | ICD-10-CM | POA: Diagnosis not present

## 2018-05-12 DIAGNOSIS — Z7901 Long term (current) use of anticoagulants: Secondary | ICD-10-CM | POA: Diagnosis not present

## 2018-05-12 DIAGNOSIS — Z881 Allergy status to other antibiotic agents status: Secondary | ICD-10-CM | POA: Diagnosis not present

## 2018-05-12 DIAGNOSIS — I358 Other nonrheumatic aortic valve disorders: Secondary | ICD-10-CM | POA: Diagnosis not present

## 2018-05-12 DIAGNOSIS — G4733 Obstructive sleep apnea (adult) (pediatric): Secondary | ICD-10-CM | POA: Diagnosis not present

## 2018-05-12 DIAGNOSIS — Z794 Long term (current) use of insulin: Secondary | ICD-10-CM | POA: Diagnosis not present

## 2018-05-12 DIAGNOSIS — K219 Gastro-esophageal reflux disease without esophagitis: Secondary | ICD-10-CM | POA: Diagnosis not present

## 2018-05-12 DIAGNOSIS — R9431 Abnormal electrocardiogram [ECG] [EKG]: Secondary | ICD-10-CM | POA: Diagnosis not present

## 2018-05-12 DIAGNOSIS — I517 Cardiomegaly: Secondary | ICD-10-CM | POA: Diagnosis not present

## 2018-05-12 DIAGNOSIS — F17290 Nicotine dependence, other tobacco product, uncomplicated: Secondary | ICD-10-CM | POA: Diagnosis not present

## 2018-05-12 DIAGNOSIS — K2270 Barrett's esophagus without dysplasia: Secondary | ICD-10-CM | POA: Diagnosis not present

## 2018-05-12 NOTE — Telephone Encounter (Signed)
Returned call to patient's daughter Manuela Schwartz.Dr.Jordan advised 24 hour holter monitor.Monitor appointment scheduled this afternoon at 4:15 pm at [facility].

## 2018-05-12 NOTE — Telephone Encounter (Signed)
Received call from patient's daughter Manuela Schwartz and patient.They were in car driving back home from [place] surgery cancelled this morning due to heart rate too slow 32 to 40. Patient stated he feels fine, was not aware heart beating too slow.Stated they were told to see Dr.Jordan today.Patient refused to see a PA.Advised Dr.Jordan will be in office this afternoon.I will speak to him since his schedule is full and call you back at # [phone].

## 2018-05-21 DIAGNOSIS — Z7901 Long term (current) use of anticoagulants: Secondary | ICD-10-CM | POA: Diagnosis not present

## 2018-05-21 DIAGNOSIS — E1129 Type 2 diabetes mellitus with other diabetic kidney complication: Secondary | ICD-10-CM | POA: Diagnosis not present

## 2018-05-21 DIAGNOSIS — I480 Paroxysmal atrial fibrillation: Secondary | ICD-10-CM | POA: Diagnosis not present

## 2018-05-25 DIAGNOSIS — C4441 Basal cell carcinoma of skin of scalp and neck: Secondary | ICD-10-CM | POA: Diagnosis not present

## 2018-05-25 DIAGNOSIS — Z85828 Personal history of other malignant neoplasm of skin: Secondary | ICD-10-CM | POA: Diagnosis not present

## 2018-05-26 DIAGNOSIS — Z9989 Dependence on other enabling machines and devices: Secondary | ICD-10-CM | POA: Diagnosis not present

## 2018-05-26 DIAGNOSIS — I482 Chronic atrial fibrillation, unspecified: Secondary | ICD-10-CM | POA: Diagnosis not present

## 2018-05-26 DIAGNOSIS — Z7901 Long term (current) use of anticoagulants: Secondary | ICD-10-CM | POA: Diagnosis not present

## 2018-05-26 DIAGNOSIS — Q111 Other anophthalmos: Secondary | ICD-10-CM | POA: Diagnosis not present

## 2018-05-26 DIAGNOSIS — H5789 Other specified disorders of eye and adnexa: Secondary | ICD-10-CM | POA: Diagnosis not present

## 2018-05-26 DIAGNOSIS — E119 Type 2 diabetes mellitus without complications: Secondary | ICD-10-CM | POA: Diagnosis not present

## 2018-05-26 DIAGNOSIS — F17210 Nicotine dependence, cigarettes, uncomplicated: Secondary | ICD-10-CM | POA: Diagnosis not present

## 2018-05-26 DIAGNOSIS — Z794 Long term (current) use of insulin: Secondary | ICD-10-CM | POA: Diagnosis not present

## 2018-05-26 DIAGNOSIS — I129 Hypertensive chronic kidney disease with stage 1 through stage 4 chronic kidney disease, or unspecified chronic kidney disease: Secondary | ICD-10-CM | POA: Diagnosis not present

## 2018-05-26 DIAGNOSIS — K219 Gastro-esophageal reflux disease without esophagitis: Secondary | ICD-10-CM | POA: Diagnosis not present

## 2018-05-26 DIAGNOSIS — G4733 Obstructive sleep apnea (adult) (pediatric): Secondary | ICD-10-CM | POA: Diagnosis not present

## 2018-05-26 DIAGNOSIS — H02115 Cicatricial ectropion of left lower eyelid: Secondary | ICD-10-CM | POA: Diagnosis not present

## 2018-05-26 DIAGNOSIS — M1990 Unspecified osteoarthritis, unspecified site: Secondary | ICD-10-CM | POA: Diagnosis not present

## 2018-05-26 DIAGNOSIS — H02412 Mechanical ptosis of left eyelid: Secondary | ICD-10-CM | POA: Diagnosis not present

## 2018-05-26 DIAGNOSIS — N400 Enlarged prostate without lower urinary tract symptoms: Secondary | ICD-10-CM | POA: Diagnosis not present

## 2018-05-26 DIAGNOSIS — H02125 Mechanical ectropion of left lower eyelid: Secondary | ICD-10-CM | POA: Diagnosis not present

## 2018-05-26 DIAGNOSIS — E1122 Type 2 diabetes mellitus with diabetic chronic kidney disease: Secondary | ICD-10-CM | POA: Diagnosis not present

## 2018-05-26 DIAGNOSIS — N183 Chronic kidney disease, stage 3 (moderate): Secondary | ICD-10-CM | POA: Diagnosis not present

## 2018-05-26 DIAGNOSIS — Z79899 Other long term (current) drug therapy: Secondary | ICD-10-CM | POA: Diagnosis not present

## 2018-05-26 DIAGNOSIS — Z881 Allergy status to other antibiotic agents status: Secondary | ICD-10-CM | POA: Diagnosis not present

## 2018-05-26 DIAGNOSIS — Z882 Allergy status to sulfonamides status: Secondary | ICD-10-CM | POA: Diagnosis not present

## 2018-06-04 DIAGNOSIS — E1129 Type 2 diabetes mellitus with other diabetic kidney complication: Secondary | ICD-10-CM | POA: Diagnosis not present

## 2018-06-04 DIAGNOSIS — Z7901 Long term (current) use of anticoagulants: Secondary | ICD-10-CM | POA: Diagnosis not present

## 2018-06-04 DIAGNOSIS — F3289 Other specified depressive episodes: Secondary | ICD-10-CM | POA: Diagnosis not present

## 2018-06-04 DIAGNOSIS — Z6830 Body mass index (BMI) 30.0-30.9, adult: Secondary | ICD-10-CM | POA: Diagnosis not present

## 2018-06-04 DIAGNOSIS — G6289 Other specified polyneuropathies: Secondary | ICD-10-CM | POA: Diagnosis not present

## 2018-06-04 DIAGNOSIS — I10 Essential (primary) hypertension: Secondary | ICD-10-CM | POA: Diagnosis not present

## 2018-06-04 DIAGNOSIS — I480 Paroxysmal atrial fibrillation: Secondary | ICD-10-CM | POA: Diagnosis not present

## 2018-06-04 DIAGNOSIS — N183 Chronic kidney disease, stage 3 (moderate): Secondary | ICD-10-CM | POA: Diagnosis not present

## 2018-06-04 DIAGNOSIS — Z794 Long term (current) use of insulin: Secondary | ICD-10-CM | POA: Diagnosis not present

## 2018-06-05 DIAGNOSIS — R310 Gross hematuria: Secondary | ICD-10-CM | POA: Diagnosis not present

## 2018-06-17 DIAGNOSIS — R062 Wheezing: Secondary | ICD-10-CM | POA: Diagnosis not present

## 2018-06-18 MED FILL — PROAIR HFA 90 MCG INHALER: 108 (90 BAS | 25 days supply | Qty: 9 | Fill #0

## 2018-06-24 DIAGNOSIS — Z7901 Long term (current) use of anticoagulants: Secondary | ICD-10-CM | POA: Diagnosis not present

## 2018-06-24 DIAGNOSIS — E1129 Type 2 diabetes mellitus with other diabetic kidney complication: Secondary | ICD-10-CM | POA: Diagnosis not present

## 2018-06-24 DIAGNOSIS — I480 Paroxysmal atrial fibrillation: Secondary | ICD-10-CM | POA: Diagnosis not present

## 2018-07-13 DIAGNOSIS — F329 Major depressive disorder, single episode, unspecified: Secondary | ICD-10-CM | POA: Diagnosis not present

## 2018-07-27 NOTE — Progress Notes (Signed)
Anthony Ho Date of Birth: 1932/06/17 Medical Record #[id number]  History of Present Illness: Anthony Ho is seen for  followup of Afib. He has a history of atrial fibrillation and underwent cardioversion in 2005. His atrial fibrillation recurred and he was placed on amiodarone which converted him  medically. When seen in November 2014 he had severe bradycardia with HR in the low 40s. Amiodarone was stopped. About 3 weeks later he was in atrial fibrillation with HR 66 bpm. He is on coumadin. He has since been treated with rate control and anticoagulation.  In November he was scheduled for eye surgery. He was noted to be bradycardic and surgery was cancelled. Seen here and Holter monitor showed Afib with controlled rate. Minimal HR 49. Mean 62. No pause > 2.8 seconds. Echo showed normal LV function with moderate AI and moderate pulmonary HTN. He was cleared for surgery.   On follow up today he is doing well. He goes to the fitness center 5x/week and exercises for an hour. Notes he has lost 32 lbs since 2018 and is no longer on insulin.    He has no lower extremity edema.  He notes a little drop in his energy level.   Current Outpatient Medications on File Prior to Visit  Medication Sig Dispense Refill  . alendronate (FOSAMAX) 70 MG tablet Take 70 mg by mouth every 7 (seven) days. Take with a full glass of water on an empty stomach.     Marland Kitchen amLODipine (NORVASC) 10 MG tablet Take 1 tablet (10 mg total) by mouth daily. 30 tablet 5  . Calcium Carbonate-Vitamin D (CALCIUM + D PO) Take 1,200 mg by mouth.    Marland Kitchen CINNAMON PO Take 1,000 mg by mouth daily.      . Cyanocobalamin (VITAMIN B12 PO) Take 1 tablet by mouth daily.    . ferrous sulfate 325 (65 FE) MG tablet Take 325 mg by mouth daily with breakfast.    . furosemide (LASIX) 40 MG tablet Take 40 mg by mouth daily.      Marland Kitchen gabapentin (NEURONTIN) 300 MG capsule Take 600 mg by mouth at bedtime.     . INSULIN ASPART [place] Inject 30 Units into the skin as  needed.     Marland Kitchen losartan (COZAAR) 100 MG tablet Take 100 mg by mouth daily.    . NON FORMULARY CPAP at night    . pantoprazole (PROTONIX) 40 MG tablet Take 1 tablet (40 mg total) by mouth daily. 30 tablet 5  . Potassium Chloride (KLOR-CON PO) Take 25 mg by mouth. Daily,morning    . pravastatin (PRAVACHOL) 40 MG tablet Take 40 mg by mouth daily.      Marland Kitchen warfarin (COUMADIN) 3 MG tablet Take 3 mg by mouth daily.      No current facility-administered medications on file prior to visit.     No Known Allergies  Past Medical History:  Diagnosis Date  . Aortic insufficiency   . Atrial fibrillation ([place])   . Barrett esophagus   . Chronic anticoagulation   . Diabetes mellitus   . Dizziness   . Dyspnea   . GERD (gastroesophageal reflux disease)   . Hiatal hernia   . Hyperlipidemia   . Hyperplastic colon polyp 2006  . Hypertension   . Osteopenia   . Sinus bradycardia 05/05/2013  . Sleep apnea     Past Surgical History:  Procedure Laterality Date  . BLADDER SURGERY    . CARDIOVASCULAR STRESS TEST  04/19/2010  EF 72%  . CARDIOVERSION  2004  . CARPAL TUNNEL RELEASE    . COLON SURGERY    . COLONOSCOPY  08/23/2009   Normal   . EYE SURGERY     MULTIPLE WITH ENUCLEATION ON THE LEFT  . PROSTATE SURGERY    . SHOULDER ARTHROSCOPY     RIGHT SHOULDER  . TRANSTHORACIC ECHOCARDIOGRAM  04/19/2010   EF 55-60%  . TUMOR REMOVAL FROM STOMACH    . US ECHOCARDIOGRAPHY  03/31/2003   EF 60-65%    Social History   Tobacco Use  Smoking Status Current Every Day Smoker  . Types: Cigars  Smokeless Tobacco Never Used    Social History   Substance and Sexual Activity  Alcohol Use No    Family History  Problem Relation Age of Onset  . Heart failure Mother   . Atrial fibrillation Mother   . Heart attack Father        X2  . Heart attack Sister 72    Review of Systems: As noted in history of present illness.   All other systems were reviewed and are negative.  Physical Exam: BP (!)  152/46 (BP Location: Right Arm, Cuff Size: Normal)   Pulse 70   Ht 5\' 10"  (1.778 m)   Wt 195 lb (88.5 kg)   BMI 27.98 kg/m  GENERAL:  Well appearing WM in NAD HEENT:  PERRL, EOMI, sclera are clear. Oropharynx is clear. NECK:  No jugular venous distention, carotid upstroke brisk and symmetric, no bruits, no thyromegaly or adenopathy LUNGS:  Clear to auscultation bilaterally CHEST:  Unremarkable HEART:  IRRR,  PMI not displaced or sustained,S1 and S2 within normal limits, no S3, no S4: no clicks, no rubs, no murmurs ABD:  Soft, nontender. BS +, no masses or bruits. No hepatomegaly, no splenomegaly EXT:  2 + pulses throughout, no edema, no cyanosis no clubbing SKIN:  Warm and dry.  No rashes NEURO:  Alert and oriented x 3. Cranial nerves II through XII intact. PSYCH:  Cognitively intact      LABORATORY DATA:  Labs reviewed from 10/18/14: cholesterol 128, triglycerides 114, HDL 36, LDL 69. CMET normal. A1c dated 11/17/15: 6.8%.  Dated 06/27/16: cholesterol 149, triglycerides 182, HDL 35, LDL 78. Creatinine 1.5. Other chemistries normal. Hgb 15.1.  Dated 12/25/16: A1c 6.3%. Dated 07/04/17: cholesterol 144, triglycerides 163, HDL 41, LDL 70. TSH normal Dated 02/12/18: A1c 6.2%.  Dated 03/19/18: creatinine 1.3. otherwise chemistries and CBC normal.  Echo 05/02/18: Study Conclusions  - Left ventricle: The cavity size was normal. There was moderate   concentric hypertrophy. Systolic function was normal. The   estimated ejection fraction was in the range of 60% to 65%. Wall   motion was normal; there were no regional wall motion   abnormalities. - Aortic valve: Transvalvular velocity was within the normal range.   There was no stenosis. There was moderate regurgitation. - Aorta: Ascending aortic diameter: 41 mm (S). - Ascending aorta: The ascending aorta was mildly dilated. - Mitral valve: Calcified annulus. Transvalvular velocity was   within the normal range. There was no evidence for  stenosis.   There was trivial regurgitation. - Left atrium: The atrium was severely dilated. - Right ventricle: The cavity size was mildly dilated. Wall   thickness was normal. Systolic function was normal. - Right atrium: The atrium was severely dilated. - Atrial septum: No defect or patent foramen ovale was identified   by color flow Doppler. - Tricuspid valve: There was mild regurgitation. -  Pulmonary arteries: Systolic pressure was moderately increased.   PA peak pressure: 52 mm Hg (S).  Holter monitor 05/13/19:Study Highlights    Atrial fibrillation with controlled ventricular response. Average HR 62. Fastest HR 103. slowest sustained bradycardia 49 bpm. longest pause 2.8 seconds  Rare PVC      Assessment / Plan: 1. Atrial fibrillation. Chronic. HR seems to respond normally to exercise.  Continue anticoagulation. He is on no rate control therapy at this time. Evaluation with Holter in November was OK.   2. Hypertension, systolic BP is elevated today but diastolic is relatively low- 46 mm Hg on my exam. Given history of lightheadedness will continue current therapy  3. Diabetes mellitus- well controlled with weight loss and dietary modification.  4. Aortic insufficiency. Moderate. Normal LV size. Will follow up Echo in one year.   5. Lightheadedness. Chronic.   I will follow up in 6 months.

## 2018-07-28 DIAGNOSIS — Z7901 Long term (current) use of anticoagulants: Secondary | ICD-10-CM | POA: Diagnosis not present

## 2018-07-28 DIAGNOSIS — E1129 Type 2 diabetes mellitus with other diabetic kidney complication: Secondary | ICD-10-CM | POA: Diagnosis not present

## 2018-07-28 DIAGNOSIS — I480 Paroxysmal atrial fibrillation: Secondary | ICD-10-CM | POA: Diagnosis not present

## 2018-07-29 DIAGNOSIS — H11222 Conjunctival granuloma, left eye: Secondary | ICD-10-CM | POA: Diagnosis not present

## 2018-07-29 DIAGNOSIS — H119 Unspecified disorder of conjunctiva: Secondary | ICD-10-CM | POA: Diagnosis not present

## 2018-07-30 ENCOUNTER — Ambulatory Visit: Payer: PPO | Admitting: Cardiology

## 2018-07-30 ENCOUNTER — Encounter: Payer: Self-pay | Admitting: Cardiology

## 2018-07-30 VITALS — BP 152/46 | HR 70 | Ht 70.0 in | Wt 195.0 lb

## 2018-07-30 DIAGNOSIS — I351 Nonrheumatic aortic (valve) insufficiency: Secondary | ICD-10-CM

## 2018-07-30 DIAGNOSIS — I10 Essential (primary) hypertension: Secondary | ICD-10-CM

## 2018-07-30 DIAGNOSIS — I482 Chronic atrial fibrillation, unspecified: Secondary | ICD-10-CM

## 2018-08-27 DIAGNOSIS — I480 Paroxysmal atrial fibrillation: Secondary | ICD-10-CM | POA: Diagnosis not present

## 2018-08-27 DIAGNOSIS — Z7901 Long term (current) use of anticoagulants: Secondary | ICD-10-CM | POA: Diagnosis not present

## 2018-08-27 DIAGNOSIS — E1129 Type 2 diabetes mellitus with other diabetic kidney complication: Secondary | ICD-10-CM | POA: Diagnosis not present

## 2018-09-03 DIAGNOSIS — Z4422 Encounter for fitting and adjustment of artificial left eye: Secondary | ICD-10-CM | POA: Diagnosis not present

## 2018-10-02 DIAGNOSIS — R82998 Other abnormal findings in urine: Secondary | ICD-10-CM | POA: Diagnosis not present

## 2018-10-02 DIAGNOSIS — E1129 Type 2 diabetes mellitus with other diabetic kidney complication: Secondary | ICD-10-CM | POA: Diagnosis not present

## 2018-10-02 DIAGNOSIS — Z125 Encounter for screening for malignant neoplasm of prostate: Secondary | ICD-10-CM | POA: Diagnosis not present

## 2018-10-02 DIAGNOSIS — I480 Paroxysmal atrial fibrillation: Secondary | ICD-10-CM | POA: Diagnosis not present

## 2018-10-02 DIAGNOSIS — I10 Essential (primary) hypertension: Secondary | ICD-10-CM | POA: Diagnosis not present

## 2018-10-09 DIAGNOSIS — Z1331 Encounter for screening for depression: Secondary | ICD-10-CM | POA: Diagnosis not present

## 2018-10-09 DIAGNOSIS — R0609 Other forms of dyspnea: Secondary | ICD-10-CM | POA: Diagnosis not present

## 2018-10-09 DIAGNOSIS — Z0000 Encounter for general adult medical examination without abnormal findings: Secondary | ICD-10-CM | POA: Diagnosis not present

## 2018-10-09 DIAGNOSIS — Z1339 Encounter for screening examination for other mental health and behavioral disorders: Secondary | ICD-10-CM | POA: Diagnosis not present

## 2018-10-09 DIAGNOSIS — E1129 Type 2 diabetes mellitus with other diabetic kidney complication: Secondary | ICD-10-CM | POA: Diagnosis not present

## 2018-10-09 DIAGNOSIS — G4733 Obstructive sleep apnea (adult) (pediatric): Secondary | ICD-10-CM | POA: Diagnosis not present

## 2018-10-09 DIAGNOSIS — I480 Paroxysmal atrial fibrillation: Secondary | ICD-10-CM | POA: Diagnosis not present

## 2018-10-09 DIAGNOSIS — E1121 Type 2 diabetes mellitus with diabetic nephropathy: Secondary | ICD-10-CM | POA: Diagnosis not present

## 2018-10-09 DIAGNOSIS — Z7901 Long term (current) use of anticoagulants: Secondary | ICD-10-CM | POA: Diagnosis not present

## 2018-10-09 DIAGNOSIS — I129 Hypertensive chronic kidney disease with stage 1 through stage 4 chronic kidney disease, or unspecified chronic kidney disease: Secondary | ICD-10-CM | POA: Diagnosis not present

## 2018-10-09 DIAGNOSIS — N183 Chronic kidney disease, stage 3 (moderate): Secondary | ICD-10-CM | POA: Diagnosis not present

## 2018-11-04 DIAGNOSIS — E1129 Type 2 diabetes mellitus with other diabetic kidney complication: Secondary | ICD-10-CM | POA: Diagnosis not present

## 2018-11-04 DIAGNOSIS — Z7901 Long term (current) use of anticoagulants: Secondary | ICD-10-CM | POA: Diagnosis not present

## 2018-11-04 DIAGNOSIS — I480 Paroxysmal atrial fibrillation: Secondary | ICD-10-CM | POA: Diagnosis not present

## 2018-11-20 ENCOUNTER — Other Ambulatory Visit: Payer: Self-pay | Admitting: Adult Health

## 2018-12-08 DIAGNOSIS — Z7901 Long term (current) use of anticoagulants: Secondary | ICD-10-CM | POA: Diagnosis not present

## 2018-12-08 DIAGNOSIS — I480 Paroxysmal atrial fibrillation: Secondary | ICD-10-CM | POA: Diagnosis not present

## 2018-12-28 DIAGNOSIS — H524 Presbyopia: Secondary | ICD-10-CM | POA: Diagnosis not present

## 2018-12-28 DIAGNOSIS — E119 Type 2 diabetes mellitus without complications: Secondary | ICD-10-CM | POA: Diagnosis not present

## 2018-12-28 DIAGNOSIS — H25811 Combined forms of age-related cataract, right eye: Secondary | ICD-10-CM | POA: Diagnosis not present

## 2018-12-28 DIAGNOSIS — H35371 Puckering of macula, right eye: Secondary | ICD-10-CM | POA: Diagnosis not present

## 2019-01-13 DIAGNOSIS — Z7901 Long term (current) use of anticoagulants: Secondary | ICD-10-CM | POA: Diagnosis not present

## 2019-01-13 DIAGNOSIS — I480 Paroxysmal atrial fibrillation: Secondary | ICD-10-CM | POA: Diagnosis not present

## 2019-01-13 DIAGNOSIS — R319 Hematuria, unspecified: Secondary | ICD-10-CM | POA: Diagnosis not present

## 2019-01-13 DIAGNOSIS — E1129 Type 2 diabetes mellitus with other diabetic kidney complication: Secondary | ICD-10-CM | POA: Diagnosis not present

## 2019-02-02 NOTE — Progress Notes (Signed)
Anthony Ho Date of Birth: 1931/10/23 Medical Record #[id number]  History of Present Illness: [name] is seen for  followup of Afib. He has a history of atrial fibrillation and underwent cardioversion in 2005. His atrial fibrillation recurred and he was placed on amiodarone which converted him  medically. When seen in November 2014 he had severe bradycardia with HR in the low 40s. Amiodarone was stopped. About 3 weeks later he was in atrial fibrillation with HR 66 bpm. He is on coumadin. He has since been treated with rate control and anticoagulation.  In November he was scheduled for eye surgery. He was noted to be bradycardic and surgery was cancelled. Seen here and Holter monitor showed Afib with controlled rate. Minimal HR 49. Mean 62. No pause > 2.8 seconds. Echo showed normal LV function with moderate AI and moderate pulmonary HTN. He was cleared for surgery.   On follow up today he is doing well. He hasn't been able to use the fitness center and has gained weight. No palpitations, dizziness, syncope. No chest pain. Some dyspnea going up stairs. He has a little more edema recently.   Current Outpatient Medications on File Prior to Visit  Medication Sig Dispense Refill  . alendronate (FOSAMAX) 70 MG tablet Take 70 mg by mouth every 7 (seven) days. Take with a full glass of water on an empty stomach.     Marland Kitchen amLODipine (NORVASC) 10 MG tablet TAKE ONE TABLET BY MOUTH ONE TIME DAILY  90 tablet 3  . Calcium Carbonate-Vitamin D (CALCIUM + D PO) Take 1,200 mg by mouth.    Marland Kitchen CINNAMON PO Take 1,000 mg by mouth daily.      . Cyanocobalamin (VITAMIN B12 PO) Take 1 tablet by mouth daily.    . ferrous sulfate 325 (65 FE) MG tablet Take 325 mg by mouth daily with breakfast.    . furosemide (LASIX) 40 MG tablet Take 40 mg by mouth daily.      Marland Kitchen gabapentin (NEURONTIN) 300 MG capsule Take 600 mg by mouth at bedtime.     . INSULIN ASPART [place] Inject 30 Units into the skin as needed.     Marland Kitchen losartan  (COZAAR) 100 MG tablet Take 100 mg by mouth daily.    . NON FORMULARY CPAP at night    . pantoprazole (PROTONIX) 40 MG tablet Take 1 tablet (40 mg total) by mouth daily. 30 tablet 5  . Potassium Chloride (KLOR-CON PO) Take 25 mg by mouth. Daily,morning    . pravastatin (PRAVACHOL) 40 MG tablet Take 40 mg by mouth daily.      Marland Kitchen warfarin (COUMADIN) 3 MG tablet Take 3 mg by mouth daily.      No current facility-administered medications on file prior to visit.     No Known Allergies  Past Medical History:  Diagnosis Date  . Aortic insufficiency   . Atrial fibrillation ([place])   . Barrett esophagus   . Chronic anticoagulation   . Diabetes mellitus   . Dizziness   . Dyspnea   . GERD (gastroesophageal reflux disease)   . Hiatal hernia   . Hyperlipidemia   . Hyperplastic colon polyp 2006  . Hypertension   . Osteopenia   . Sinus bradycardia 05/05/2013  . Sleep apnea     Past Surgical History:  Procedure Laterality Date  . BLADDER SURGERY    . CARDIOVASCULAR STRESS TEST  04/19/2010   EF 72%  . CARDIOVERSION  2004  . CARPAL TUNNEL RELEASE    .  COLON SURGERY    . COLONOSCOPY  08/23/2009   Normal   . EYE SURGERY     MULTIPLE WITH ENUCLEATION ON THE LEFT  . PROSTATE SURGERY    . SHOULDER ARTHROSCOPY     RIGHT SHOULDER  . TRANSTHORACIC ECHOCARDIOGRAM  04/19/2010   EF 55-60%  . TUMOR REMOVAL FROM STOMACH    . US ECHOCARDIOGRAPHY  03/31/2003   EF 60-65%    Social History   Tobacco Use  Smoking Status Current Every Day Smoker  . Types: Cigars  Smokeless Tobacco Never Used    Social History   Substance and Sexual Activity  Alcohol Use No    Family History  Problem Relation Age of Onset  . Heart failure Mother   . Atrial fibrillation Mother   . Heart attack Father        X2  . Heart attack Sister 43    Review of Systems: As noted in history of present illness.   All other systems were reviewed and are negative.  Physical Exam: BP (!) 142/68   Pulse 67    Temp (!) 97 F (36.1 C)   Ht 5\' 10"  (1.778 m)   Wt 202 lb (91.6 kg)   SpO2 97%   BMI 28.98 kg/m  GENERAL:  Well appearing WM in NAD HEENT:  PERRL, EOMI, sclera are clear. Oropharynx is clear. NECK:  No jugular venous distention, carotid upstroke brisk and symmetric, no bruits, no thyromegaly or adenopathy LUNGS:  Clear to auscultation bilaterally CHEST:  Unremarkable HEART:  IRRR,  PMI not displaced or sustained,S1 and S2 within normal limits, no S3, no S4: no clicks, no rubs, no murmurs ABD:  Soft, nontender. BS +, no masses or bruits. No hepatomegaly, no splenomegaly EXT:  2 + pulses throughout, 1+ edema, no cyanosis no clubbing SKIN:  Warm and dry.  No rashes NEURO:  Alert and oriented x 3. Cranial nerves II through XII intact. PSYCH:  Cognitively intact      LABORATORY DATA:  Labs reviewed from 10/18/14: cholesterol 128, triglycerides 114, HDL 36, LDL 69. CMET normal. A1c dated 11/17/15: 6.8%.  Dated 06/27/16: cholesterol 149, triglycerides 182, HDL 35, LDL 78. Creatinine 1.5. Other chemistries normal. Hgb 15.1.  Dated 12/25/16: A1c 6.3%. Dated 07/04/17: cholesterol 144, triglycerides 163, HDL 41, LDL 70. TSH normal Dated 02/12/18: A1c 6.2%.  Dated 03/19/18: creatinine 1.3. otherwise chemistries and CBC normal. Dated 10/02/18: cholesterol 134, triglycerides 106, HDL 37, LDL 76. A1c 6.8%. creatinine 1.5. otherwise Chemistries, CBC, TSH normal.   Echo 05/02/18: Study Conclusions  - Left ventricle: The cavity size was normal. There was moderate   concentric hypertrophy. Systolic function was normal. The   estimated ejection fraction was in the range of 60% to 65%. Wall   motion was normal; there were no regional wall motion   abnormalities. - Aortic valve: Transvalvular velocity was within the normal range.   There was no stenosis. There was moderate regurgitation. - Aorta: Ascending aortic diameter: 41 mm (S). - Ascending aorta: The ascending aorta was mildly dilated. - Mitral  valve: Calcified annulus. Transvalvular velocity was   within the normal range. There was no evidence for stenosis.   There was trivial regurgitation. - Left atrium: The atrium was severely dilated. - Right ventricle: The cavity size was mildly dilated. Wall   thickness was normal. Systolic function was normal. - Right atrium: The atrium was severely dilated. - Atrial septum: No defect or patent foramen ovale was identified   by color  flow Doppler. - Tricuspid valve: There was mild regurgitation. - Pulmonary arteries: Systolic pressure was moderately increased.   PA peak pressure: 52 mm Hg (S).  Holter monitor 05/13/19:Study Highlights    Atrial fibrillation with controlled ventricular response. Average HR 62. Fastest HR 103. slowest sustained bradycardia 49 bpm. longest pause 2.8 seconds  Rare PVC      Assessment / Plan: 1. Atrial fibrillation. Chronic. HR seems to respond normally to exercise.  Rate controlled. Continue anticoagulation. He is on no rate control therapy at this time. Evaluation with Holter in November was OK.   2. Hypertension, under good control  3. Diabetes mellitus- well controlled with weight loss and dietary modification.  4. Aortic insufficiency. Moderate. Normal LV size. Will follow up Echo in 6 months  5. Edema. Recommend he take an extra 20 mg lasix as needed for increased swelling.    I will follow up in 6 months.

## 2019-02-03 ENCOUNTER — Other Ambulatory Visit: Payer: Self-pay

## 2019-02-03 ENCOUNTER — Encounter: Payer: Self-pay | Admitting: Cardiology

## 2019-02-03 ENCOUNTER — Ambulatory Visit: Payer: PPO | Admitting: Cardiology

## 2019-02-03 VITALS — BP 142/68 | HR 67 | Temp 97.0°F | Ht 70.0 in | Wt 202.0 lb

## 2019-02-03 DIAGNOSIS — I351 Nonrheumatic aortic (valve) insufficiency: Secondary | ICD-10-CM | POA: Diagnosis not present

## 2019-02-03 DIAGNOSIS — I482 Chronic atrial fibrillation, unspecified: Secondary | ICD-10-CM

## 2019-02-03 DIAGNOSIS — I10 Essential (primary) hypertension: Secondary | ICD-10-CM | POA: Diagnosis not present

## 2019-02-11 DIAGNOSIS — N183 Chronic kidney disease, stage 3 (moderate): Secondary | ICD-10-CM | POA: Diagnosis not present

## 2019-02-11 DIAGNOSIS — E1129 Type 2 diabetes mellitus with other diabetic kidney complication: Secondary | ICD-10-CM | POA: Diagnosis not present

## 2019-02-11 DIAGNOSIS — Z7901 Long term (current) use of anticoagulants: Secondary | ICD-10-CM | POA: Diagnosis not present

## 2019-02-11 DIAGNOSIS — I480 Paroxysmal atrial fibrillation: Secondary | ICD-10-CM | POA: Diagnosis not present

## 2019-02-15 DIAGNOSIS — N183 Chronic kidney disease, stage 3 (moderate): Secondary | ICD-10-CM | POA: Diagnosis not present

## 2019-02-15 DIAGNOSIS — Z7901 Long term (current) use of anticoagulants: Secondary | ICD-10-CM | POA: Diagnosis not present

## 2019-02-15 DIAGNOSIS — Z794 Long term (current) use of insulin: Secondary | ICD-10-CM | POA: Diagnosis not present

## 2019-02-15 DIAGNOSIS — G629 Polyneuropathy, unspecified: Secondary | ICD-10-CM | POA: Diagnosis not present

## 2019-02-15 DIAGNOSIS — L299 Pruritus, unspecified: Secondary | ICD-10-CM | POA: Diagnosis not present

## 2019-02-15 DIAGNOSIS — E1129 Type 2 diabetes mellitus with other diabetic kidney complication: Secondary | ICD-10-CM | POA: Diagnosis not present

## 2019-02-15 DIAGNOSIS — G4733 Obstructive sleep apnea (adult) (pediatric): Secondary | ICD-10-CM | POA: Diagnosis not present

## 2019-02-15 DIAGNOSIS — I129 Hypertensive chronic kidney disease with stage 1 through stage 4 chronic kidney disease, or unspecified chronic kidney disease: Secondary | ICD-10-CM | POA: Diagnosis not present

## 2019-02-23 DIAGNOSIS — H25811 Combined forms of age-related cataract, right eye: Secondary | ICD-10-CM | POA: Diagnosis not present

## 2019-02-23 DIAGNOSIS — H35031 Hypertensive retinopathy, right eye: Secondary | ICD-10-CM | POA: Diagnosis not present

## 2019-02-23 DIAGNOSIS — H35371 Puckering of macula, right eye: Secondary | ICD-10-CM | POA: Diagnosis not present

## 2019-02-23 DIAGNOSIS — Z970 Presence of artificial eye: Secondary | ICD-10-CM | POA: Diagnosis not present

## 2019-03-16 DIAGNOSIS — H0012 Chalazion right lower eyelid: Secondary | ICD-10-CM | POA: Diagnosis not present

## 2019-03-17 DIAGNOSIS — Z7901 Long term (current) use of anticoagulants: Secondary | ICD-10-CM | POA: Diagnosis not present

## 2019-03-17 DIAGNOSIS — E1129 Type 2 diabetes mellitus with other diabetic kidney complication: Secondary | ICD-10-CM | POA: Diagnosis not present

## 2019-03-17 DIAGNOSIS — I480 Paroxysmal atrial fibrillation: Secondary | ICD-10-CM | POA: Diagnosis not present

## 2019-03-23 DIAGNOSIS — H0012 Chalazion right lower eyelid: Secondary | ICD-10-CM | POA: Diagnosis not present

## 2019-04-01 DIAGNOSIS — Z23 Encounter for immunization: Secondary | ICD-10-CM | POA: Diagnosis not present

## 2019-04-02 DIAGNOSIS — H0012 Chalazion right lower eyelid: Secondary | ICD-10-CM | POA: Diagnosis not present

## 2019-04-15 DIAGNOSIS — I480 Paroxysmal atrial fibrillation: Secondary | ICD-10-CM | POA: Diagnosis not present

## 2019-04-15 DIAGNOSIS — Z7901 Long term (current) use of anticoagulants: Secondary | ICD-10-CM | POA: Diagnosis not present

## 2019-04-15 DIAGNOSIS — E1129 Type 2 diabetes mellitus with other diabetic kidney complication: Secondary | ICD-10-CM | POA: Diagnosis not present

## 2019-05-18 DIAGNOSIS — Z7901 Long term (current) use of anticoagulants: Secondary | ICD-10-CM | POA: Diagnosis not present

## 2019-05-18 DIAGNOSIS — E1129 Type 2 diabetes mellitus with other diabetic kidney complication: Secondary | ICD-10-CM | POA: Diagnosis not present

## 2019-05-18 DIAGNOSIS — I480 Paroxysmal atrial fibrillation: Secondary | ICD-10-CM | POA: Diagnosis not present

## 2019-05-24 DIAGNOSIS — Z7901 Long term (current) use of anticoagulants: Secondary | ICD-10-CM | POA: Diagnosis not present

## 2019-05-24 DIAGNOSIS — I272 Pulmonary hypertension, unspecified: Secondary | ICD-10-CM | POA: Diagnosis not present

## 2019-05-24 DIAGNOSIS — I10 Essential (primary) hypertension: Secondary | ICD-10-CM | POA: Diagnosis not present

## 2019-05-24 DIAGNOSIS — I351 Nonrheumatic aortic (valve) insufficiency: Secondary | ICD-10-CM | POA: Diagnosis not present

## 2019-05-24 DIAGNOSIS — Z0000 Encounter for general adult medical examination without abnormal findings: Secondary | ICD-10-CM | POA: Diagnosis not present

## 2019-05-24 DIAGNOSIS — Z794 Long term (current) use of insulin: Secondary | ICD-10-CM | POA: Diagnosis not present

## 2019-05-24 DIAGNOSIS — E1129 Type 2 diabetes mellitus with other diabetic kidney complication: Secondary | ICD-10-CM | POA: Diagnosis not present

## 2019-05-24 DIAGNOSIS — G4733 Obstructive sleep apnea (adult) (pediatric): Secondary | ICD-10-CM | POA: Diagnosis not present

## 2019-05-24 DIAGNOSIS — R310 Gross hematuria: Secondary | ICD-10-CM | POA: Diagnosis not present

## 2019-05-24 DIAGNOSIS — I480 Paroxysmal atrial fibrillation: Secondary | ICD-10-CM | POA: Diagnosis not present

## 2019-06-02 ENCOUNTER — Encounter: Payer: Self-pay | Admitting: Neurology

## 2019-06-02 DIAGNOSIS — L570 Actinic keratosis: Secondary | ICD-10-CM | POA: Diagnosis not present

## 2019-06-02 DIAGNOSIS — Z85828 Personal history of other malignant neoplasm of skin: Secondary | ICD-10-CM | POA: Diagnosis not present

## 2019-06-02 DIAGNOSIS — L308 Other specified dermatitis: Secondary | ICD-10-CM | POA: Diagnosis not present

## 2019-06-02 DIAGNOSIS — R351 Nocturia: Secondary | ICD-10-CM | POA: Diagnosis not present

## 2019-06-02 DIAGNOSIS — R310 Gross hematuria: Secondary | ICD-10-CM | POA: Diagnosis not present

## 2019-06-02 DIAGNOSIS — N401 Enlarged prostate with lower urinary tract symptoms: Secondary | ICD-10-CM | POA: Diagnosis not present

## 2019-06-03 ENCOUNTER — Ambulatory Visit (INDEPENDENT_AMBULATORY_CARE_PROVIDER_SITE_OTHER): Payer: PPO | Admitting: Neurology

## 2019-06-03 ENCOUNTER — Encounter: Payer: Self-pay | Admitting: Neurology

## 2019-06-03 ENCOUNTER — Other Ambulatory Visit: Payer: Self-pay

## 2019-06-03 VITALS — BP 161/76 | HR 64 | Temp 97.8°F | Ht 70.0 in | Wt 202.0 lb

## 2019-06-03 DIAGNOSIS — K22710 Barrett's esophagus with low grade dysplasia: Secondary | ICD-10-CM

## 2019-06-03 DIAGNOSIS — R001 Bradycardia, unspecified: Secondary | ICD-10-CM

## 2019-06-03 DIAGNOSIS — Z9989 Dependence on other enabling machines and devices: Secondary | ICD-10-CM | POA: Diagnosis not present

## 2019-06-03 DIAGNOSIS — I480 Paroxysmal atrial fibrillation: Secondary | ICD-10-CM

## 2019-06-03 DIAGNOSIS — G4733 Obstructive sleep apnea (adult) (pediatric): Secondary | ICD-10-CM

## 2019-06-03 DIAGNOSIS — I10 Essential (primary) hypertension: Secondary | ICD-10-CM | POA: Diagnosis not present

## 2019-06-03 NOTE — Progress Notes (Addendum)
SLEEP MEDICINE CLINIC    Provider:  Larey Seat, MD  Primary Care Physician:  Leanna Battles, [place] [address]     Referring Provider: Leanna Battles, [place],  [place] [address]          Chief Complaint according to patient   Patient presents with:    . New Patient (Initial Visit)     pt had a sleep study completed here in 2012. he is currently using his machine everynight and it has given a message that he exceeded life of machine.  DME adapt but pt has not been getting new supplies, states he has plenty.       HISTORY OF PRESENT ILLNESS:  Anthony Ho is a 85 y.o. year old Caucasian male patient seen here upon  a referral on 06/03/2019 from Dr Anthony Ho. Chief concern according to patient :  " my CPAP tells me it is going to quit on me " I use all the water in the reservoir each night"    I have the pleasure of seeing Anthony Ho today, a right-handed White or Caucasian male with known OSA sleep disorder. He  has a past medical history of Aortic insufficiency, Atrial fibrillation ([place]), Barrett esophagus, Chronic anticoagulation, Diabetes mellitus, Dizziness, Dyspnea, GERD (gastroesophageal reflux disease), Hiatal hernia, Hyperlipidemia, Hyperplastic colon polyp (2006), Hypertension, Osteopenia, Sinus bradycardia (05/05/2013), and Obstructive Sleep apnea in an active cigar smoker.    The patient had the first sleep study on 05-27-2011, [name] underwent a split-night polysomnography at the time at [facility] with a history of hypertension, diabetes mellitus, renal failure at that time graded CKD stage III, GERD, ankle edema enucleation of the left eye, atrial fibrillation coronary artery disease and atrial flutter and he had already carries a diagnosis of mild obstructive sleep apnea he was snoring at the time he was on amiodarone for heart rate control his baseline AHI was 48.34 so at the time he was certainly  suffering from a severe form of sleep apnea he had only 10 minutes of desaturations, he was titrated from 4 cmH2O to 11 cmH2O an optimal pressure however was not identified.  He returned for full night titration and ended up on 7 cmH2O, the recommendation was to avoid supine sleep and used a Quatro full facemask during the study he had periodic limb movements.  He followed up for outpatient results the last time I saw him was on 14 October 2012.  At the time he had just started on insulin and he had a rotator cuff surgery at the carpal tunnel site surgery behind him ejection fraction and an echocardiogram had been over 60% really excellent.  He now returns with the same machine I ordered originally for him 7 cmH2O pressure is still set 1 cm EPR, he is a 90% compliant user average user time is 8 hours 22 minutes and the residual AHI is 3.8/h still very good he has some central apneas emerging but the overall AHI is excellent he does have a lot of air leakage.  This 30 days encompassed the nights between 17 November and 02 June 2019.  He needs new equipment now because his machine has given him "the message "  Sleep relevant medical history: no Nocturia/ status post TURP.   Family medical /sleep history: no other family member on CPAP with OSA, no insomnia, sleep walkers.    Social history:  Patient is retired  and lives in a household with spouse at [facility] / [place] for over 10 years. The couple has an adult daughter.  Tobacco use yes- cigars .  ETOH use : [place] H and H , 2 drinks at supper ,  Caffeine intake in form of Coffee( decaffeinated 1 cup ) Soda( rare) Tea (no ) and no energy drinks. Regular exercise in local GYM- now just walking , sometimes on treadmill at home.      Sleep habits are as follows: The patient's dinner time is between 7 PM. The patient goes to bed at 9 PM and continues to sleep for several  hours, wakes now rarely bathroom breaks.  Gets 8-9 hours of sleep, the  bedroom is cool, quiet and dark.  The preferred sleep position is right lateral , with the support of 1 pillow.  Dreams are reportedly nfrequent.  6-7 AM is the usual rise time. The patient wakes up spontaneously.  He reports  feeling refreshed and restored in AM,but  with symptoms such as dry mouth, no morning headaches*   Review of Systems: Out of a complete 14 system review, the patient complains of only the following symptoms, and all other reviewed systems are negative.:  Fatigue, sleepiness , snoring if not on CPAP. Dry mouth, bronchitis at time. Low back pain.    How likely are you to doze in the following situations: 0 = not likely, 1 = slight chance, 2 = moderate chance, 3 = high chance   Sitting and Reading? Watching Television? Sitting inactive in a public place (theater or meeting)? As a passenger in a car for an hour without a break? Lying down in the afternoon when circumstances permit? Sitting and talking to someone? Sitting quietly after lunch without alcohol? In a car, while stopped for a few minutes in traffic?   Total = 5/ 24 points   FSS endorsed at 21/ 63 points.   Social History   Socioeconomic History  . Marital status: Married    Spouse name: Not on file  . Number of children: 4  . Years of education: Not on file  . Highest education level: Not on file  Occupational History  . Occupation: Retired    Fish farm manager: RETIRED  Tobacco Use  . Smoking status: Current Every Day Smoker    Types: Cigars  . Smokeless tobacco: Never Used  Substance and Sexual Activity  . Alcohol use: No  . Drug use: No  . Sexual activity: Not on file  Other Topics Concern  . Not on file  Social History Narrative  . Not on file   Social Determinants of Health   Financial Resource Strain:   . Difficulty of Paying Living Expenses: Not on file  Food Insecurity:   . Worried About Charity fundraiser in the Last Year: Not on file  . Ran Out of Food in the Last Year: Not on  file  Transportation Needs:   . Lack of Transportation (Medical): Not on file  . Lack of Transportation (Non-Medical): Not on file  Physical Activity:   . Days of Exercise per Week: Not on file  . Minutes of Exercise per Session: Not on file  Stress:   . Feeling of Stress : Not on file  Social Connections:   . Frequency of Communication with Friends and Family: Not on file  . Frequency of Social Gatherings with Friends and Family: Not on file  . Attends Religious Services: Not on file  . Active Member of  Clubs or Organizations: Not on file  . Attends Archivist Meetings: Not on file  . Marital Status: Not on file    Family History  Problem Relation Age of Onset  . Heart failure Mother   . Atrial fibrillation Mother   . Heart attack Father        X2  . Heart attack Sister 81    Past Medical History:  Diagnosis Date  . Aortic insufficiency   . Atrial fibrillation ([place])   . Barrett esophagus   . Chronic anticoagulation   . Diabetes mellitus   . Dizziness   . Dyspnea   . GERD (gastroesophageal reflux disease)   . Hiatal hernia   . Hyperlipidemia   . Hyperplastic colon polyp 2006  . Hypertension   . Osteopenia   . Sinus bradycardia 05/05/2013  . Sleep apnea     Past Surgical History:  Procedure Laterality Date  . BLADDER SURGERY    . CARDIOVASCULAR STRESS TEST  04/19/2010   EF 72%  . CARDIOVERSION  2004  . CARPAL TUNNEL RELEASE    . COLON SURGERY    . COLONOSCOPY  08/23/2009   Normal   . EYE SURGERY     MULTIPLE WITH ENUCLEATION ON THE LEFT  . PROSTATE SURGERY    . SHOULDER ARTHROSCOPY     RIGHT SHOULDER  . TRANSTHORACIC ECHOCARDIOGRAM  04/19/2010   EF 55-60%  . TUMOR REMOVAL FROM STOMACH    . US ECHOCARDIOGRAPHY  03/31/2003   EF 60-65%     Current Outpatient Medications on File Prior to Visit  Medication Sig Dispense Refill  . alendronate (FOSAMAX) 70 MG tablet Take 70 mg by mouth every 7 (seven) days. Take with a full glass of water on an  empty stomach.     Marland Kitchen amLODipine (NORVASC) 10 MG tablet TAKE ONE TABLET BY MOUTH ONE TIME DAILY  90 tablet 3  . Calcium Carbonate-Vitamin D (CALCIUM + D PO) Take 1,200 mg by mouth.    Marland Kitchen CINNAMON PO Take 1,000 mg by mouth daily.      . Cyanocobalamin (VITAMIN B12 PO) Take 1 tablet by mouth daily.    Marland Kitchen doxazosin (CARDURA) 1 MG tablet Take 1 mg by mouth at bedtime.    . ferrous sulfate 325 (65 FE) MG tablet Take 325 mg by mouth daily with breakfast.    . furosemide (LASIX) 40 MG tablet Take 40 mg by mouth daily.      Marland Kitchen gabapentin (NEURONTIN) 300 MG capsule Take 600 mg by mouth at bedtime.     . hydrOXYzine (ATARAX/VISTARIL) 25 MG tablet Take 25 mg by mouth at bedtime as needed.    . INSULIN ASPART [place] Inject 30 Units into the skin as needed.     Marland Kitchen losartan (COZAAR) 100 MG tablet Take 100 mg by mouth daily.    . NON FORMULARY CPAP at night    . pantoprazole (PROTONIX) 40 MG tablet Take 1 tablet (40 mg total) by mouth daily. 30 tablet 5  . Potassium Chloride (KLOR-CON PO) Take 25 mg by mouth. Daily,morning    . pravastatin (PRAVACHOL) 40 MG tablet Take 40 mg by mouth daily.      Marland Kitchen warfarin (COUMADIN) 3 MG tablet Take 3 mg by mouth daily.      No current facility-administered medications on file prior to visit.    No Known Allergies  Physical exam:  Today's Vitals   06/03/19 0942  BP: (!) 161/76  Pulse: 64  Temp:  97.8 F (36.6 C)  Weight: 202 lb (91.6 kg)  Height: 5\' 10"  (1.778 m)   Body mass index is 28.98 kg/m.   Wt Readings from Last 3 Encounters:  06/03/19 202 lb (91.6 kg)  02/03/19 202 lb (91.6 kg)  07/30/18 195 lb (88.5 kg)     Ht Readings from Last 3 Encounters:  06/03/19 5\' 10"  (1.778 m)  02/03/19 5\' 10"  (1.778 m)  07/30/18 5\' 10"  (1.778 m)      General: The patient is awake, alert and appears not in acute distress. The patient is well groomed. He smell sof cigar smoke, though.  Head: Normocephalic, atraumatic. Neck is supple. Mallampati  4 neck circumference:16. 75   inches . Nasal airflow  patent.  Retrognathia is not  seen.  Dental status:  dentures Cardiovascular:  irregular rate and cardiac rhythm by pulse,  without distended neck veins. Chronic atrial fib.  Respiratory: Lungs are clear to auscultation.  Skin:  Without evidence of ankle edema, or rash. Trunk: The patient's posture is erect.   Neurologic exam : The patient is awake and alert, oriented to place and time.   Memory subjective described as intact.  Attention span & concentration ability appears normal.  Speech is fluent,  without  dysarthria, dysphonia or aphasia.  Mood and affect are appropriate.   Cranial nerves: no loss of smell or taste reported  Pupil on the right only briskly reactive to light. Left eye is a prosthesis.  Funduscopic exam deferred.   Extraocular movements in vertical and horizontal planes were intact .  Hearing was intact to soft voice and finger rubbing.    Facial sensation intact to fine touch.  Facial motor strength is symmetric and tongue and uvula move midline.  Neck ROM : rotation, tilt and flexion extension were normal for age and shoulder shrug was symmetrical.    Motor exam:  Symmetric bulk, tone and ROM.   Normal tone without cog wheeling, symmetric grip strength .   Sensory:  Fine touch, pinprick and vibration were tested  and  normal.  Proprioception tested in the upper extremities was normal.   Coordination: Rapid alternating movements in the fingers/hands were of normal speed.  The Finger-to-nose maneuver was intact without evidence of ataxia, dysmetria or tremor.   Gait and station: Patient could rise unassisted from a seated position,but has to brace  walked without assistive device.  Stance is of normal width/ base and the patient turned with 4 steps.  Toe and heel walk were deferred.  Deep tendon reflexes: in the  upper and lower extremities are symmetric and intact.  Babinski response was deferred.l        After spending a total  time of  45  minutes face to face and additional time for physical and neurologic examination, review of laboratory studies,  personal review of imaging studies, reports and results of other testing and review of referral information / records as far as provided in visit, I have established the following assessments:  1) Mr. Baye has been successful in losing weight with a modified weight watcher diet, he is Conscious but also aware that he needs proteins to maintain muscle mass.  Unfortunately his exercise regimen has been interrupted by the Covid pandemic, he and his wife have taken to walking after dinner.  Today's blood pressure was elevated in spite of him having lost 30 pounds.  He is still certainly in need of CPAP given his history and his Mallampati grade.  My Plan is to proceed with:  1)What I would like to do is to obtain a home sleep test and in lab sleep test to verify his current apnea degree so that he can issue an new prescription for an auto titration CPAP hopefully.  As quoted above he has used a low pressure of 7 cmH2O with 1 cm EPR and among his residual apneas are more centrals and obstructive apneas.  Risk factor is also that he is still smoking- or rather chewing  And sucking on his Cigar" Risk manager " .  I would like to thank Leanna Battles, MD for allowing me to meet with and to take care of this pleasant patient.   In short, Anthony Ho is presenting with need for a new CPAP machine.  I plan to follow up either personally or through our NP within 2-3 month.   CC: I will share my notes with PCP.  Electronically signed by: Larey Seat, MD 06/03/2019 10:06 AM  [facility] Board certified by The [employer] of Sleep Medicine and Diplomate of the [employer] of Sleep Medicine. Board certified In Neurology through the [place], Fellow of the [employer] of Neurology. Medical Director of [facility].

## 2019-06-03 NOTE — Patient Instructions (Signed)
Atrial Fibrillation Atrial fibrillation is a type of irregular or rapid heartbeat (arrhythmia). In atrial fibrillation, the top part of the heart (atria) quivers in a chaotic pattern. This makes the heart unable to pump blood normally. Having atrial fibrillation can increase your risk for other health problems, such as:  Blood can pool in the atria and form clots. If a clot travels to the brain, it can cause a stroke.  The heart muscle may weaken from the irregular blood flow. This can cause heart failure. Atrial fibrillation may start suddenly and stop on its own, or it may become a long-lasting problem. What are the causes? This condition is caused by some heart-related conditions or procedures, including:  High blood pressure. This is the most common cause.  Heart failure.  Heart valve conditions.  Inflammation of the sac that surrounds the heart (pericarditis).  Heart surgery.  Coronary artery disease.  Certain heart rhythm disorders, such as Wolf-Parkinson-White syndrome. Other causes include:  Pneumonia.  Obstructive sleep apnea.  Lung cancer.  Thyroid problems, especially if the thyroid is overactive (hyperthyroidism).  Excessive alcohol or drug use. Sometimes, the cause of this condition is not known. What increases the risk? This condition is more likely to develop in:  Older people.  People who smoke.  People who have diabetes mellitus.  People who are overweight (obese).  Athletes who exercise vigorously.  People who have a family history. What are the signs or symptoms? Symptoms of this condition include:  A feeling that your heart is beating rapidly or irregularly.  A feeling of discomfort or pain in your chest.  Shortness of breath.  Sudden light-headedness or weakness.  Getting tired easily during exercise. In some cases, there are no symptoms. How is this diagnosed? Your health care provider may be able to detect atrial fibrillation when  taking your pulse. If detected, this condition may be diagnosed with:  Electrocardiogram (ECG).  Ambulatory cardiac monitor. This device records your heartbeats for 24 hours or more.  Transthoracic echocardiogram (TTE) to evaluate how blood flows through your heart.  Transesophageal echocardiogram (TEE) to view more detailed images of your heart.  A stress test.  Imaging tests, such as a CT scan or chest X-ray.  Blood tests. How is this treated? This condition may be treated with:  Medicines to slow down the heart rate or bring the heart's rhythm back to normal.  Medicines to prevent blood clots from forming.  Electrical cardioversion. This delivers a low-energy shock to the heart to reset its rhythm.  Ablation. This procedure destroys the part of the heart tissue that sends abnormal signals.  Left atrial appendage occlusion/excision. This seals off a common place in the atria where blood clots can form (left atrial appendage). The goal of treatment is to prevent blood clots from forming and to keep your heart beating at a normal rate and rhythm. Treatment depends on underlying medical conditions and how you feel when you are experiencing fibrillation. Follow these instructions at home: Medicines  Take over-the counter and prescription medicines only as told by your health care provider.  If your health care provider prescribed a blood-thinning medicine (anticoagulant), take it exactly as told. Taking too much blood-thinning medicine can cause bleeding. Taking too little can enable a blood clot to form and travel to the brain, causing a stroke. Lifestyle      Do not use any products that contain nicotine or tobacco, such as cigarettes and e-cigarettes. If you need help quitting, ask your health   care provider.  Do not drink beverages that contain caffeine, such as coffee, soda, and tea.  Follow diet instructions as told by your health care provider.  Exercise regularly as  told by your health care provider.  Do not drink alcohol. General instructions  If you have obstructive sleep apnea, manage your condition as told by your health care provider.  Maintain a healthy weight. Do not use diet pills unless your health care provider approves. Diet pills may make heart problems worse.  Keep all follow-up visits as told by your health care provider. This is important. Contact a health care provider if you:  Notice a change in the rate, rhythm, or strength of your heartbeat.  Are taking an anticoagulant and you notice increased bruising.  Tire more easily when you exercise or exert yourself.  Have a sudden change in weight. Get help right away if you have:   Chest pain, abdominal pain, sweating, or weakness.  Difficulty breathing.  Blood in your vomit, stool (feces), or urine.  Any symptoms of a stroke. "BE FAST" is an easy way to remember the main warning signs of a stroke: ? B - Balance. Signs are dizziness, sudden trouble walking, or loss of balance. ? E - Eyes. Signs are trouble seeing or a sudden change in vision. ? F - Face. Signs are sudden weakness or numbness of the face, or the face or eyelid drooping on one side. ? A - Arms. Signs are weakness or numbness in an arm. This happens suddenly and usually on one side of the body. ? S - Speech. Signs are sudden trouble speaking, slurred speech, or trouble understanding what people say. ? T - Time. Time to call emergency services. Write down what time symptoms started.  Other signs of a stroke, such as: ? A sudden, severe headache with no known cause. ? Nausea or vomiting. ? Seizure. These symptoms may represent a serious problem that is an emergency. Do not wait to see if the symptoms will go away. Get medical help right away. Call your local emergency services (911 in the [place] Do not drive yourself to the hospital. Summary  Atrial fibrillation is a type of irregular or rapid heartbeat  (arrhythmia).  Symptoms include a feeling that your heart is beating fast or irregularly. In some cases, you may not have symptoms.  The condition is treated with medicines to slow down the heart rate or bring the heart's rhythm back to normal. You may also need blood-thinning medicines to prevent blood clots.  Get help right away if you have symptoms or signs of a stroke. This information is not intended to replace advice given to you by your health care provider. Make sure you discuss any questions you have with your health care provider. Document Released: 06/03/2005 Document Revised: 07/24/2017 Document Reviewed: 07/25/2017 Elsevier Patient Education  2020 Elsevier Inc.  

## 2019-06-26 ENCOUNTER — Ambulatory Visit: Payer: Medicare Other | Attending: Internal Medicine

## 2019-06-26 DIAGNOSIS — Z23 Encounter for immunization: Secondary | ICD-10-CM

## 2019-06-26 NOTE — Progress Notes (Signed)
   Covid-19 Vaccination Clinic  Name:  Anthony Ho    MRN: [id number] DOB: 12-20-1931  06/26/2019  Anthony Ho was observed post Covid-19 immunization for 30 minutes based on pre-vaccination screening without incidence. He was provided with Vaccine Information Sheet and instruction to access the V-Safe system.   Anthony Ho was instructed to call 911 with any severe reactions post vaccine: Marland Kitchen Difficulty breathing  . Swelling of your face and throat  . A fast heartbeat  . A bad rash all over your body  . Dizziness and weakness    Immunizations Administered    Name Date Dose VIS Date Route   Pfizer COVID-19 Vaccine 06/26/2019 12:43 PM 0.3 mL 05/28/2019 Intramuscular   Manufacturer: [place]   Lot: [id number]   [place]: [id number]

## 2019-07-06 DIAGNOSIS — E1129 Type 2 diabetes mellitus with other diabetic kidney complication: Secondary | ICD-10-CM | POA: Diagnosis not present

## 2019-07-06 DIAGNOSIS — Z7901 Long term (current) use of anticoagulants: Secondary | ICD-10-CM | POA: Diagnosis not present

## 2019-07-06 DIAGNOSIS — I480 Paroxysmal atrial fibrillation: Secondary | ICD-10-CM | POA: Diagnosis not present

## 2019-07-12 ENCOUNTER — Ambulatory Visit (INDEPENDENT_AMBULATORY_CARE_PROVIDER_SITE_OTHER): Payer: Medicare Other | Admitting: Neurology

## 2019-07-12 DIAGNOSIS — I480 Paroxysmal atrial fibrillation: Secondary | ICD-10-CM

## 2019-07-12 DIAGNOSIS — G4733 Obstructive sleep apnea (adult) (pediatric): Secondary | ICD-10-CM | POA: Diagnosis not present

## 2019-07-12 DIAGNOSIS — K22710 Barrett's esophagus with low grade dysplasia: Secondary | ICD-10-CM

## 2019-07-12 DIAGNOSIS — Z9989 Dependence on other enabling machines and devices: Secondary | ICD-10-CM

## 2019-07-12 DIAGNOSIS — R001 Bradycardia, unspecified: Secondary | ICD-10-CM

## 2019-07-12 DIAGNOSIS — I10 Essential (primary) hypertension: Secondary | ICD-10-CM

## 2019-07-17 ENCOUNTER — Ambulatory Visit: Payer: Medicare Other

## 2019-07-17 ENCOUNTER — Ambulatory Visit: Payer: Medicare Other | Attending: Internal Medicine

## 2019-07-17 DIAGNOSIS — Z23 Encounter for immunization: Secondary | ICD-10-CM | POA: Insufficient documentation

## 2019-07-17 NOTE — Progress Notes (Signed)
   Covid-19 Vaccination Clinic  Name:  [name]    MRN: [id number] DOB: [birth year]  07/17/2019  [name] was observed post Covid-19 immunization for 15 minutes without incidence. He was provided with Vaccine Information Sheet and instruction to access the V-Safe system.   [name] was instructed to call 911 with any severe reactions post vaccine: Marland Kitchen Difficulty breathing  . Swelling of your face and throat  . A fast heartbeat  . A bad rash all over your body  . Dizziness and weakness    Immunizations Administered    Name Date Dose VIS Date Route   Pfizer COVID-19 Vaccine 07/17/2019 12:59 PM 0.3 mL 05/28/2019 Intramuscular   Manufacturer: [place]   Lot: [id number]   [place]: [id number]

## 2019-07-20 NOTE — Progress Notes (Signed)
  Patient Information    First Name: Anthony Ho. Last Name: [name]: [id number] Birth Date: 05/17/1932 Age: 86 Gender: Male Referring Provider: Leanna Battles, MD BMI: 29.0 (W=203 lb, H=5' 10'')   Study Date: Jul 12, 2019 S/H/A Version: 001.001.001.001 / 4.1.1528 / 77  Summary & Diagnosis:   Severe Sleep apnea is still present at an AHI of 43.8/h and REM exacerbation to 58/h. There were many short desaturations but no sustained hypoxia.   Recommendations:     CPAP therapy for severe OSA with REM exacerbation is still needed. I will order an autotitration device, with pressure settings from 5 through 10 cm water, 2 cm EPR and mask of choice, heated humidity.  Interpreting Physician: Larey Seat, MD   Cc Dr Philip Aspen, MD ( he is not on EPIC, please fax or e mail results)           Sleep Summary   Oxygen Saturation Statistics   Start Study Time: End Study Time: Total Recording Time:  8:30:59 PM 6:35:00 AM 10 h, 4 min Total Sleep Time % REM of Sleep Time:  8 h, 23 min  18.7   Mean: 93 Minimum: 80 Maximum: 99 Mean of Desaturations Nadirs (%):   90 Oxygen Desaturation. %:   4-9 10-20 >20 Total Events Number Total   174  3 98.3 1.7  0 0.0  177 100.0 Oxygen Saturation: <90 <=88 <85 <80 <70 Duration (minutes): Sleep % 2.8 0.6  0.3 0.1  0.1 0.0 0.0 0.0 0.0 0.0   Respiratory Indices    Total Events REM NREM All Night pRDI:  351 pAHI:  351 ODI:  177  pAHIc:  23  % CSR:  60.0 59.3 41.5 4.6 40.0 40.2 17.6 2.5 43.8 43.8 22.1 2.9     Pulse Rate Statistics during Sleep (BPM)    Mean:  56 Minimum: N/A Maximum: 96  Indices are calculated using technically valid sleep time of 8 h, 0 min. pRDI/pAHI are calculated using 02  desaturations ? 3%  Body Position Statistics  Position Supine Prone Right Left Non-Supine Sleep (min) 163.0 310.5 11.0 18.5 340.0 Sleep  % 32.4 61.7 2.2 3.7 67.6 pRDI 55.8 34.3 N/A 79.0 37.9 pAHI 55.8 34.3 N/A 79.0 37.9 ODI 31.1 13.0 N/A 75.6 17.7    Snoring Statistics Snoring Level (dB) >40 >50 >60 >70 >80 >Threshold (45) Sleep (min) 355.0 7.2 1.7 0.3 0.0 60.9 Sleep % 70.6 1.4 0.3 0.1 0.0 12.1  Mean: 42 dB

## 2019-07-20 NOTE — Procedures (Signed)
  Patient Information     First Name: Anthony Ho. Last Name: [name]: [id number]  Birth Date: Aug 09, 1931 Age: 86 Gender: Male  Referring Provider: Leanna Battles, MD BMI: 29.0 (W=203 lb, H=5' 10'')  Neck Circ.:  17 '' Epworth:  5/24   Sleep Study Information    Study Date: Jul 12, 2019 S/H/A Version: 001.001.001.001 / 4.1.1528 / 77    History:    06-03-2019. [name] is a right-handed White or Caucasian male with known OSA sleep disorder. He has a past medical history of Aortic insufficiency, Atrial fibrillation ([place]), Barrett esophagus, Chronic anticoagulation, Diabetes mellitus, CKD 3, atrial fib, Dizziness, Dyspnea, GERD (gastroesophageal reflux disease), Hiatal hernia, Hyperlipidemia, Hyperplastic colon polyp (2006), Hypertension, Osteopenia, Sinus bradycardia (05/05/2013), and Obstructive Sleep apnea in an active cigar smoker. He was diagnosed with OSA in 2012, had a SPLIT study at [facility], and AHI was 48/h. He was titrated to 7 cm water with 1 cm EPR, and uses still the same machine.        Summary & Diagnosis:    Severe Sleep apnea is still present at an AHI of 43.8/h and REM exacerbation to 58/h. There were many short desaturations but no sustained hypoxia.   Recommendations:      CPAP therapy for severe OSA with REM exacerbation is still needed. I will order an autotitration device, with pressure settings from 5 through 10 cm water, 2 cm EPR and mask of choice, heated humidity.  Interpreting Physician: Larey Seat, MD            Sleep Summary    Oxygen Saturation Statistics     Start Study Time: End Study Time: Total Recording Time:  8:30:59 PM 6:35:00 AM 10 h, 4 min  Total Sleep Time % REM of Sleep Time:  8 h, 23 min  18.7    Mean: 93 Minimum: 80 Maximum: 99  Mean of Desaturations Nadirs (%):   90  Oxygen Desaturation. %:   4-9 10-20 >20 Total  Events Number Total   174  3 98.3 1.7  0 0.0  177 100.0  Oxygen Saturation: <90 <=88 <85 <80  <70  Duration (minutes): Sleep % 2.8 0.6  0.3 0.1  0.1 0.0 0.0 0.0 0.0 0.0     Respiratory Indices      Total Events REM NREM All Night  pRDI:  351 pAHI:  351 ODI:  177  pAHIc:  23  % CSR:  60.0 59.3 41.5 4.6 40.0 40.2 17.6 2.5 43.8 43.8 22.1 2.9       Pulse Rate Statistics during Sleep (BPM)      Mean:  56 Minimum: N/A Maximum: 96    Indices are calculated using technically valid sleep time of 8 h, 0 min. pRDI/pAHI are calculated using 02  desaturations ? 3%  Body Position Statistics  Position Supine Prone Right Left Non-Supine  Sleep (min) 163.0 310.5 11.0 18.5 340.0  Sleep % 32.4 61.7 2.2 3.7 67.6  pRDI 55.8 34.3 N/A 79.0 37.9  pAHI 55.8 34.3 N/A 79.0 37.9  ODI 31.1 13.0 N/A 75.6 17.7     Snoring Statistics Snoring Level (dB) >40 >50 >60 >70 >80 >Threshold (45)  Sleep (min) 355.0 7.2 1.7 0.3 0.0 60.9  Sleep % 70.6 1.4 0.3 0.1 0.0 12.1    Mean: 42 dB

## 2019-07-20 NOTE — Addendum Note (Signed)
Addended by: Larey Seat on: 07/20/2019 05:18 PM   Modules accepted: Orders

## 2019-07-21 ENCOUNTER — Telehealth: Payer: Self-pay | Admitting: Neurology

## 2019-07-21 NOTE — Telephone Encounter (Signed)
Called patient to discuss sleep study results. No answer at this time. LVM for the patient to call back.   

## 2019-07-21 NOTE — Telephone Encounter (Signed)
-----   Message from Larey Seat, MD sent at 07/20/2019  5:18 PM EST -----   Patient Information    First Name: Anthony Mcgourty. Last Name: Dameion Ho: [id number] Birth Date: 1932-03-20 Age: 86 Gender: Male Referring Provider: Leanna Battles, MD BMI: 29.0 (W=203 lb, H=5' 10'')   Study Date: Jul 12, 2019 S/H/A Version: 001.001.001.001 / 4.1.1528 / 77  Summary & Diagnosis:   Severe Sleep apnea is still present at an AHI of 43.8/h and REM exacerbation to 58/h. There were many short desaturations but no sustained hypoxia.   Recommendations:     CPAP therapy for severe OSA with REM exacerbation is still needed. I will order an autotitration device, with pressure settings from 5 through 10 cm water, 2 cm EPR and mask of choice, heated humidity.  Interpreting Physician: Larey Seat, MD   Cc Dr Philip Aspen, MD ( he is not on EPIC, please fax or e mail results)           Sleep Summary   Oxygen Saturation Statistics   Start Study Time: End Study Time: Total Recording Time:  8:30:59 PM 6:35:00 AM 10 h, 4 min Total Sleep Time % REM of Sleep Time:  8 h, 23 min  18.7   Mean: 93 Minimum: 80 Maximum: 99 Mean of Desaturations Nadirs (%):   90 Oxygen Desaturation. %:   4-9 10-20 >20 Total Events Number Total   174  3 98.3 1.7  0 0.0  177 100.0 Oxygen Saturation: <90 <=88 <85 <80 <70 Duration (minutes): Sleep % 2.8 0.6  0.3 0.1  0.1 0.0 0.0 0.0 0.0 0.0   Respiratory Indices    Total Events REM NREM All Night pRDI:  351 pAHI:  351 ODI:  177  pAHIc:  23  % CSR:  60.0 59.3 41.5 4.6 40.0 40.2 17.6 2.5 43.8 43.8 22.1 2.9     Pulse Rate Statistics during Sleep (BPM)    Mean:  56 Minimum: N/A Maximum: 96  Indices are calculated using technically valid sleep time of 8 h, 0 min. pRDI/pAHI are calculated using 02  desaturations ? 3%  Body Position Statistics  Position Supine Prone Right Left Non-Supine Sleep (min) 163.0 310.5 11.0 18.5 340.0 Sleep  % 32.4 61.7 2.2 3.7 67.6 pRDI 55.8 34.3 N/A 79.0 37.9 pAHI 55.8 34.3 N/A 79.0 37.9 ODI 31.1 13.0 N/A 75.6 17.7    Snoring Statistics Snoring Level (dB) >40 >50 >60 >70 >80 >Threshold (45) Sleep (min) 355.0 7.2 1.7 0.3 0.0 60.9 Sleep % 70.6 1.4 0.3 0.1 0.0 12.1  Mean: 42 dB

## 2019-07-21 NOTE — Telephone Encounter (Signed)
Pt returning call please call back °

## 2019-07-21 NOTE — Telephone Encounter (Signed)
I called pt. I advised pt that Dr. Brett Fairy reviewed their sleep study results and found that pt still has severe . Dr. Brett Fairy recommends that pt starts auto CPAP. I reviewed PAP compliance expectations with the pt. Pt is agreeable to starting a CPAP. I advised pt that an order will be sent to a DME, [facility], and [facility] will call the pt within about one week after they file with the pt's insurance. [facility] will show the pt how to use the machine, fit for masks, and troubleshoot the CPAP if needed. A follow up appt was made for insurance purposes with Ward Givens, NP on 09/20/19 at 9 am. Pt verbalized understanding to arrive 15 minutes early and bring their CPAP. A letter with all of this information in it will be mailed to the pt as a reminder. I verified with the pt that the address we have on file is correct. Pt verbalized understanding of results. Pt had no questions at this time but was encouraged to call back if questions arise. I have sent the order to [facility] and have received confirmation that they have received the order.

## 2019-08-03 DIAGNOSIS — G4733 Obstructive sleep apnea (adult) (pediatric): Secondary | ICD-10-CM | POA: Diagnosis not present

## 2019-08-05 NOTE — Progress Notes (Deleted)
Anthony Ho Date of Birth: March 19, 1932 Medical Record [id number]  History of Present Illness: Anthony Ho is seen for  followup of Afib. He has a history of atrial fibrillation and underwent cardioversion in 2005. His atrial fibrillation recurred and he was placed on amiodarone which converted him  medically. When seen in November 2014 he had severe bradycardia with HR in the low 40s. Amiodarone was stopped. About 3 weeks later he was in atrial fibrillation with HR 66 bpm. He is on coumadin. He has since been treated with rate control and anticoagulation.  In November he was scheduled for eye surgery. He was noted to be bradycardic and surgery was cancelled. Seen here and Holter monitor showed Afib with controlled rate. Minimal HR 49. Mean 62. No pause > 2.8 seconds. Echo showed normal LV function with moderate AI and moderate pulmonary HTN. He was cleared for surgery.   On follow up today he is doing well. He hasn't been able to use the fitness center and has gained weight. No palpitations, dizziness, syncope. No chest pain. Some dyspnea going up stairs. He has a little more edema recently.   Current Outpatient Medications on File Prior to Visit  Medication Sig Dispense Refill  . alendronate (FOSAMAX) 70 MG tablet Take 70 mg by mouth every 7 (seven) days. Take with a full glass of water on an empty stomach.     Marland Kitchen amLODipine (NORVASC) 10 MG tablet TAKE ONE TABLET BY MOUTH ONE TIME DAILY  90 tablet 3  . Calcium Carbonate-Vitamin D (CALCIUM + D PO) Take 1,200 mg by mouth.    Marland Kitchen CINNAMON PO Take 1,000 mg by mouth daily.      . Cyanocobalamin (VITAMIN B12 PO) Take 1 tablet by mouth daily.    Marland Kitchen doxazosin (CARDURA) 1 MG tablet Take 1 mg by mouth at bedtime.    . ferrous sulfate 325 (65 FE) MG tablet Take 325 mg by mouth daily with breakfast.    . furosemide (LASIX) 40 MG tablet Take 40 mg by mouth daily.      Marland Kitchen gabapentin (NEURONTIN) 300 MG capsule Take 600 mg by mouth at bedtime.     . hydrOXYzine  (ATARAX/VISTARIL) 25 MG tablet Take 25 mg by mouth at bedtime as needed.    . INSULIN ASPART Dolores Inject 30 Units into the skin as needed.     Marland Kitchen losartan (COZAAR) 100 MG tablet Take 100 mg by mouth daily.    . NON FORMULARY CPAP at night    . pantoprazole (PROTONIX) 40 MG tablet Take 1 tablet (40 mg total) by mouth daily. 30 tablet 5  . Potassium Chloride (KLOR-CON PO) Take 25 mg by mouth. Daily,morning    . pravastatin (PRAVACHOL) 40 MG tablet Take 40 mg by mouth daily.      Marland Kitchen warfarin (COUMADIN) 3 MG tablet Take 3 mg by mouth daily.      No current facility-administered medications on file prior to visit.    No Known Allergies  Past Medical History:  Diagnosis Date  . Aortic insufficiency   . Atrial fibrillation ([place])   . Barrett esophagus   . Chronic anticoagulation   . Diabetes mellitus   . Dizziness   . Dyspnea   . GERD (gastroesophageal reflux disease)   . Hiatal hernia   . Hyperlipidemia   . Hyperplastic colon polyp 2006  . Hypertension   . Osteopenia   . Sinus bradycardia 05/05/2013  . Sleep apnea     Past Surgical  History:  Procedure Laterality Date  . BLADDER SURGERY    . CARDIOVASCULAR STRESS TEST  04/19/2010   EF 72%  . CARDIOVERSION  2004  . CARPAL TUNNEL RELEASE    . COLON SURGERY    . COLONOSCOPY  08/23/2009   Normal   . EYE SURGERY     MULTIPLE WITH ENUCLEATION ON THE LEFT  . PROSTATE SURGERY    . SHOULDER ARTHROSCOPY     RIGHT SHOULDER  . TRANSTHORACIC ECHOCARDIOGRAM  04/19/2010   EF 55-60%  . TUMOR REMOVAL FROM STOMACH    . US ECHOCARDIOGRAPHY  03/31/2003   EF 60-65%    Social History   Tobacco Use  Smoking Status Current Every Day Smoker  . Types: Cigars  Smokeless Tobacco Never Used    Social History   Substance and Sexual Activity  Alcohol Use No    Family History  Problem Relation Age of Onset  . Heart failure Mother   . Atrial fibrillation Mother   . Heart attack Father        X2  . Heart attack Sister 98    Review of  Systems: As noted in history of present illness.   All other systems were reviewed and are negative.  Physical Exam: There were no vitals taken for this visit. GENERAL:  Well appearing WM in NAD HEENT:  PERRL, EOMI, sclera are clear. Oropharynx is clear. NECK:  No jugular venous distention, carotid upstroke brisk and symmetric, no bruits, no thyromegaly or adenopathy LUNGS:  Clear to auscultation bilaterally CHEST:  Unremarkable HEART:  IRRR,  PMI not displaced or sustained,S1 and S2 within normal limits, no S3, no S4: no clicks, no rubs, no murmurs ABD:  Soft, nontender. BS +, no masses or bruits. No hepatomegaly, no splenomegaly EXT:  2 + pulses throughout, 1+ edema, no cyanosis no clubbing SKIN:  Warm and dry.  No rashes NEURO:  Alert and oriented x 3. Cranial nerves II through XII intact. PSYCH:  Cognitively intact      LABORATORY DATA:  Labs reviewed from 10/18/14: cholesterol 128, triglycerides 114, HDL 36, LDL 69. CMET normal. A1c dated 11/17/15: 6.8%.  Dated 06/27/16: cholesterol 149, triglycerides 182, HDL 35, LDL 78. Creatinine 1.5. Other chemistries normal. Hgb 15.1.  Dated 12/25/16: A1c 6.3%. Dated 07/04/17: cholesterol 144, triglycerides 163, HDL 41, LDL 70. TSH normal Dated 02/12/18: A1c 6.2%.  Dated 03/19/18: creatinine 1.3. otherwise chemistries and CBC normal. Dated 10/02/18: cholesterol 134, triglycerides 106, HDL 37, LDL 76. A1c 6.8%. creatinine 1.5. otherwise Chemistries, CBC, TSH normal. Dated 07/06/19: A1c 7.2%. glucose 155.    Echo 05/02/18: Study Conclusions  - Left ventricle: The cavity size was normal. There was moderate   concentric hypertrophy. Systolic function was normal. The   estimated ejection fraction was in the range of 60% to 65%. Wall   motion was normal; there were no regional wall motion   abnormalities. - Aortic valve: Transvalvular velocity was within the normal range.   There was no stenosis. There was moderate regurgitation. - Aorta:  Ascending aortic diameter: 41 mm (S). - Ascending aorta: The ascending aorta was mildly dilated. - Mitral valve: Calcified annulus. Transvalvular velocity was   within the normal range. There was no evidence for stenosis.   There was trivial regurgitation. - Left atrium: The atrium was severely dilated. - Right ventricle: The cavity size was mildly dilated. Wall   thickness was normal. Systolic function was normal. - Right atrium: The atrium was severely dilated. - Atrial septum:  No defect or patent foramen ovale was identified   by color flow Doppler. - Tricuspid valve: There was mild regurgitation. - Pulmonary arteries: Systolic pressure was moderately increased.   PA peak pressure: 52 mm Hg (S).  Holter monitor 05/13/19:Study Highlights    Atrial fibrillation with controlled ventricular response. Average HR 62. Fastest HR 103. slowest sustained bradycardia 49 bpm. longest pause 2.8 seconds  Rare PVC      Assessment / Plan: 1. Atrial fibrillation. Chronic. HR seems to respond normally to exercise.  Rate controlled. Continue anticoagulation. He is on no rate control therapy at this time. Evaluation with Holter in November was OK.   2. Hypertension, under good control  3. Diabetes mellitus- well controlled with weight loss and dietary modification.  4. Aortic insufficiency. Moderate. Normal LV size. Will follow up Echo in 6 months  5. Edema. Recommend he take an extra 20 mg lasix as needed for increased swelling.    I will follow up in 6 months.

## 2019-08-06 ENCOUNTER — Ambulatory Visit: Payer: PPO | Admitting: Cardiology

## 2019-08-11 NOTE — Progress Notes (Signed)
Anthony Ho Date of Birth: 1932/02/08 Medical Record [id number]  History of Present Illness: Anthony Ho is seen for  followup of Afib. He has a history of atrial fibrillation and underwent cardioversion in 2005. His atrial fibrillation recurred and he was placed on amiodarone which converted him  medically. When seen in November 2014 he had severe bradycardia with HR in the low 40s. Amiodarone was stopped. About 3 weeks later he was in atrial fibrillation with HR 66 bpm. He is on coumadin. He has since been treated with rate control and anticoagulation.  In November 2019  he was scheduled for eye surgery. He was noted to be bradycardic and surgery was cancelled. Seen here and Holter monitor showed Afib with controlled rate. Minimal HR 49. Mean 62. No pause > 2.8 seconds. Echo showed normal LV function with moderate AI and moderate pulmonary HTN. He was cleared for surgery.   On follow up today he is doing well. He goes to the fitness center 3 days a week and exercises for 45-60 minutes on treadmill and doing some weights. He notes his gait is not as steady and sometimes uses a cane. No falls.  No palpitations, dizziness, syncope. No chest pain. Edema is stable.   Current Outpatient Medications on File Prior to Visit  Medication Sig Dispense Refill  . alendronate (FOSAMAX) 70 MG tablet Take 70 mg by mouth every 7 (seven) days. Take with a full glass of water on an empty stomach.     Marland Kitchen amLODipine (NORVASC) 10 MG tablet TAKE ONE TABLET BY MOUTH ONE TIME DAILY  90 tablet 3  . Calcium Carbonate-Vitamin D (CALCIUM + D PO) Take 1,200 mg by mouth.    Marland Kitchen CINNAMON PO Take 1,000 mg by mouth daily.      . Cyanocobalamin (VITAMIN B12 PO) Take 1 tablet by mouth daily.    Marland Kitchen doxazosin (CARDURA) 1 MG tablet Take 1 mg by mouth at bedtime.    . ferrous sulfate 325 (65 FE) MG tablet Take 325 mg by mouth daily with breakfast.    . furosemide (LASIX) 40 MG tablet Take 40 mg by mouth daily.      Marland Kitchen gabapentin  (NEURONTIN) 300 MG capsule Take 600 mg by mouth at bedtime.     . hydrOXYzine (ATARAX/VISTARIL) 25 MG tablet Take 25 mg by mouth at bedtime as needed.    . INSULIN ASPART [place] Inject 30 Units into the skin as needed.     Marland Kitchen losartan (COZAAR) 100 MG tablet Take 100 mg by mouth daily.    . NON FORMULARY CPAP at night    . pantoprazole (PROTONIX) 40 MG tablet Take 1 tablet (40 mg total) by mouth daily. 30 tablet 5  . pravastatin (PRAVACHOL) 40 MG tablet Take 40 mg by mouth daily.      Marland Kitchen warfarin (COUMADIN) 3 MG tablet Take 3 mg by mouth daily.      No current facility-administered medications on file prior to visit.    No Known Allergies  Past Medical History:  Diagnosis Date  . Aortic insufficiency   . Atrial fibrillation ([place])   . Barrett esophagus   . Chronic anticoagulation   . Diabetes mellitus   . Dizziness   . Dyspnea   . GERD (gastroesophageal reflux disease)   . Hiatal hernia   . Hyperlipidemia   . Hyperplastic colon polyp 2006  . Hypertension   . Osteopenia   . Sinus bradycardia 05/05/2013  . Sleep apnea  Past Surgical History:  Procedure Laterality Date  . BLADDER SURGERY    . CARDIOVASCULAR STRESS TEST  04/19/2010   EF 72%  . CARDIOVERSION  2004  . CARPAL TUNNEL RELEASE    . COLON SURGERY    . COLONOSCOPY  08/23/2009   Normal   . EYE SURGERY     MULTIPLE WITH ENUCLEATION ON THE LEFT  . PROSTATE SURGERY    . SHOULDER ARTHROSCOPY     RIGHT SHOULDER  . TRANSTHORACIC ECHOCARDIOGRAM  04/19/2010   EF 55-60%  . TUMOR REMOVAL FROM STOMACH    . US ECHOCARDIOGRAPHY  03/31/2003   EF 60-65%    Social History   Tobacco Use  Smoking Status Current Every Day Smoker  . Types: Cigars  Smokeless Tobacco Never Used    Social History   Substance and Sexual Activity  Alcohol Use No    Family History  Problem Relation Age of Onset  . Heart failure Mother   . Atrial fibrillation Mother   . Heart attack Father        X2  . Heart attack Sister 76     Review of Systems: As noted in history of present illness.   All other systems were reviewed and are negative.  Physical Exam: BP 140/76 (BP Location: Left Arm, Patient Position: Sitting, Cuff Size: Normal)   Pulse 75   Temp 97.8 F (36.6 C)   Ht 5\' 10"  (1.778 m)   Wt 205 lb (93 kg)   BMI 29.41 kg/m  GENERAL:  Well appearing WM in NAD HEENT:  PERRL, EOMI, sclera are clear. Oropharynx is clear. NECK:  No jugular venous distention, carotid upstroke brisk and symmetric, no bruits, no thyromegaly or adenopathy LUNGS:  Clear to auscultation bilaterally CHEST:  Unremarkable HEART:  IRRR,  PMI not displaced or sustained,S1 and S2 within normal limits, no S3, no S4: no clicks, no rubs, no murmurs ABD:  Soft, nontender. BS +, no masses or bruits. No hepatomegaly, no splenomegaly EXT:  2 + pulses throughout, 1+ edema, no cyanosis no clubbing SKIN:  Warm and dry.  No rashes NEURO:  Alert and oriented x 3. Cranial nerves II through XII intact. PSYCH:  Cognitively intact      LABORATORY DATA:  Labs reviewed from 10/18/14: cholesterol 128, triglycerides 114, HDL 36, LDL 69. CMET normal. A1c dated 11/17/15: 6.8%.  Dated 06/27/16: cholesterol 149, triglycerides 182, HDL 35, LDL 78. Creatinine 1.5. Other chemistries normal. Hgb 15.1.  Dated 12/25/16: A1c 6.3%. Dated 07/04/17: cholesterol 144, triglycerides 163, HDL 41, LDL 70. TSH normal Dated 02/12/18: A1c 6.2%.  Dated 03/19/18: creatinine 1.3. otherwise chemistries and CBC normal. Dated 10/02/18: cholesterol 134, triglycerides 106, HDL 37, LDL 76. A1c 6.8%. creatinine 1.5. otherwise Chemistries, CBC, TSH normal. Dated 07/06/19: A1c 7.2%. glucose 155.    Echo 05/02/18: Study Conclusions  - Left ventricle: The cavity size was normal. There was moderate   concentric hypertrophy. Systolic function was normal. The   estimated ejection fraction was in the range of 60% to 65%. Wall   motion was normal; there were no regional wall motion    abnormalities. - Aortic valve: Transvalvular velocity was within the normal range.   There was no stenosis. There was moderate regurgitation. - Aorta: Ascending aortic diameter: 41 mm (S). - Ascending aorta: The ascending aorta was mildly dilated. - Mitral valve: Calcified annulus. Transvalvular velocity was   within the normal range. There was no evidence for stenosis.   There was trivial regurgitation. -  Left atrium: The atrium was severely dilated. - Right ventricle: The cavity size was mildly dilated. Wall   thickness was normal. Systolic function was normal. - Right atrium: The atrium was severely dilated. - Atrial septum: No defect or patent foramen ovale was identified   by color flow Doppler. - Tricuspid valve: There was mild regurgitation. - Pulmonary arteries: Systolic pressure was moderately increased.   PA peak pressure: 52 mm Hg (S).  Holter monitor 05/13/19:Study Highlights    Atrial fibrillation with controlled ventricular response. Average HR 62. Fastest HR 103. slowest sustained bradycardia 49 bpm. longest pause 2.8 seconds  Rare PVC      Assessment / Plan: 1. Atrial fibrillation. Chronic. HR  responds normally to exercise.  Rate controlled on no rate control therapy. Continue anticoagulation on Coumadin.   2. Hypertension, under good control  3. Diabetes mellitus- last A1c 7.2%. states he only uses insulin when sugar > 200.   4. Aortic insufficiency. Moderate. Normal LV size. Will follow up Echo now  5. Edema. Stable. Continue lasix 40 mg daily.    I will follow up in 6 months.

## 2019-08-12 ENCOUNTER — Encounter: Payer: Self-pay | Admitting: Cardiology

## 2019-08-12 ENCOUNTER — Other Ambulatory Visit: Payer: Self-pay

## 2019-08-12 ENCOUNTER — Ambulatory Visit: Payer: Medicare Other | Admitting: Cardiology

## 2019-08-12 VITALS — BP 140/76 | HR 75 | Temp 97.8°F | Ht 70.0 in | Wt 205.0 lb

## 2019-08-12 DIAGNOSIS — I482 Chronic atrial fibrillation, unspecified: Secondary | ICD-10-CM | POA: Diagnosis not present

## 2019-08-12 DIAGNOSIS — I10 Essential (primary) hypertension: Secondary | ICD-10-CM

## 2019-08-12 DIAGNOSIS — I351 Nonrheumatic aortic (valve) insufficiency: Secondary | ICD-10-CM | POA: Diagnosis not present

## 2019-08-17 DIAGNOSIS — I480 Paroxysmal atrial fibrillation: Secondary | ICD-10-CM | POA: Diagnosis not present

## 2019-08-17 DIAGNOSIS — E1129 Type 2 diabetes mellitus with other diabetic kidney complication: Secondary | ICD-10-CM | POA: Diagnosis not present

## 2019-08-17 DIAGNOSIS — Z7901 Long term (current) use of anticoagulants: Secondary | ICD-10-CM | POA: Diagnosis not present

## 2019-08-26 ENCOUNTER — Other Ambulatory Visit: Payer: Self-pay

## 2019-08-26 ENCOUNTER — Ambulatory Visit (HOSPITAL_COMMUNITY): Payer: Medicare Other | Attending: Internal Medicine

## 2019-08-26 DIAGNOSIS — I10 Essential (primary) hypertension: Secondary | ICD-10-CM | POA: Diagnosis not present

## 2019-08-26 DIAGNOSIS — I482 Chronic atrial fibrillation, unspecified: Secondary | ICD-10-CM | POA: Diagnosis not present

## 2019-08-26 DIAGNOSIS — I351 Nonrheumatic aortic (valve) insufficiency: Secondary | ICD-10-CM | POA: Diagnosis not present

## 2019-08-31 DIAGNOSIS — G4733 Obstructive sleep apnea (adult) (pediatric): Secondary | ICD-10-CM | POA: Diagnosis not present

## 2019-09-01 ENCOUNTER — Other Ambulatory Visit: Payer: Self-pay

## 2019-09-01 DIAGNOSIS — H35371 Puckering of macula, right eye: Secondary | ICD-10-CM | POA: Diagnosis not present

## 2019-09-01 DIAGNOSIS — H25811 Combined forms of age-related cataract, right eye: Secondary | ICD-10-CM | POA: Diagnosis not present

## 2019-09-01 DIAGNOSIS — Z970 Presence of artificial eye: Secondary | ICD-10-CM | POA: Diagnosis not present

## 2019-09-01 MED ORDER — AMLODIPINE BESYLATE 10 MG PO TABS: 10.0000 mg | ORAL_TABLET | Freq: Every day | ORAL | 3 refills | Status: DC

## 2019-09-02 ENCOUNTER — Telehealth: Payer: Self-pay | Admitting: Cardiology

## 2019-09-02 MED ORDER — AMLODIPINE BESYLATE 10 MG PO TABS: 10.0000 mg | ORAL_TABLET | Freq: Every day | ORAL | 3 refills | Status: DC

## 2019-09-02 NOTE — Telephone Encounter (Signed)
New message   Patient needs a call in reference to his new pharmacy being put on his chart. Please call to discuss.

## 2019-09-02 NOTE — Telephone Encounter (Signed)
Spoke to patient he stated he has changed mail order pharmacies to [facility] order.Stated he needs amlodipine refill sent to them.Advised refill sent.

## 2019-09-13 DIAGNOSIS — H524 Presbyopia: Secondary | ICD-10-CM | POA: Diagnosis not present

## 2019-09-13 DIAGNOSIS — H35371 Puckering of macula, right eye: Secondary | ICD-10-CM | POA: Diagnosis not present

## 2019-09-13 DIAGNOSIS — E119 Type 2 diabetes mellitus without complications: Secondary | ICD-10-CM | POA: Diagnosis not present

## 2019-09-13 DIAGNOSIS — H25811 Combined forms of age-related cataract, right eye: Secondary | ICD-10-CM | POA: Diagnosis not present

## 2019-09-20 ENCOUNTER — Ambulatory Visit: Payer: Medicare Other | Admitting: Adult Health

## 2019-09-20 ENCOUNTER — Other Ambulatory Visit: Payer: Self-pay

## 2019-09-20 ENCOUNTER — Encounter: Payer: Self-pay | Admitting: Adult Health

## 2019-09-20 VITALS — BP 141/64 | HR 63 | Temp 97.1°F | Ht 70.0 in | Wt 221.0 lb

## 2019-09-20 DIAGNOSIS — Z9989 Dependence on other enabling machines and devices: Secondary | ICD-10-CM

## 2019-09-20 DIAGNOSIS — G4733 Obstructive sleep apnea (adult) (pediatric): Secondary | ICD-10-CM

## 2019-09-20 NOTE — Patient Instructions (Signed)
  Your Plan:  Continue using CPAP nightly and greater than 4 hours each night Mask refitting Pressure will be adjusted to 5-15 If your symptoms worsen or you develop new symptoms please let us know.        Thank you for coming to see [place] at [facility]. I hope we have been able to provide you high quality care today.  You may receive a patient satisfaction survey over the next few weeks. We would appreciate your feedback and comments so that we may continue to improve ourselves and the health of our patients.

## 2019-09-20 NOTE — Progress Notes (Addendum)
PATIENT: Anthony Ho DOB: 11-Aug-1931  REASON FOR VISIT: follow up HISTORY FROM: patient  HISTORY OF PRESENT ILLNESS:  09/20/19:  Anthony Ho is an 86 year old male with a history of obstructive sleep apnea on CPAP.  He returns today for follow-up.  His download indicates that he uses machine nightly for compliance of 100%.  He uses machine greater than 4 hours each night.  On average he uses his machine 10 hours and 22 minutes.  Residual AHI is 6.8 on 5 to 10 cm of water with EPR of 2.  Leak in the 95th percentile is 37.8 L/min.  He reports that he prefers his nasal mask.  He currently has a fullface mask.  HISTORY 06/03/19: Anthony Ho a 86 y.o. year old Caucasian male patientseen here upon  a referralon 06/03/2019 from Dr Anthony Ho. Chiefconcernaccording to patient : " my CPAP tells me it is going to quit on me " I use all the water in the reservoir each night"   I have the pleasure of seeing Anthony Ho today,a right-handed White or Caucasian male with known OSA sleep disorder. He has a past medical history of Aortic insufficiency, Atrial fibrillation ([place]), Barrett esophagus, Chronic anticoagulation, Diabetes mellitus, Dizziness, Dyspnea, GERD (gastroesophageal reflux disease), Hiatal hernia, Hyperlipidemia, Hyperplastic colon polyp (2006), Hypertension, Osteopenia, Sinus bradycardia (05/05/2013), and Obstructive Sleep apnea in an active cigar smoker.   The patient had the first sleep study on 05-27-2011, Anthony Ho underwent a split-night polysomnography at the time at [facility] with a history of hypertension, diabetes mellitus, renal failure at that time graded CKD stage III, GERD, ankle edema enucleation of the left eye, atrial fibrillation coronary artery disease and atrial flutter and he had already carries a diagnosis of mild obstructive sleep apnea he was snoring at the time he was on amiodarone for heart rate control his baseline AHI was 48.34 so at the  time he was certainly suffering from a severe form of sleep apnea he had only 10 minutes of desaturations, he was titrated from 4 cmH2O to 11 cmH2O an optimal pressure however was not identified.  He returned for full night titration and ended up on 7 cmH2O, the recommendation was to avoid supine sleep and used a Quatro full facemask during the study he had periodic limb movements.  He followed up for outpatient results the last time I saw him was on 14 October 2012.  At the time he had just started on insulin and he had a rotator cuff surgery at the carpal tunnel site surgery behind him ejection fraction and an echocardiogram had been over 60% really excellent.  He now returns with the same machine I ordered originally for him 7 cmH2O pressure is still set 1 cm EPR, he is a 90% compliant user average user time is 8 hours 22 minutes and the residual AHI is 3.8/h still very good he has some central apneas emerging but the overall AHI is excellent he does have a lot of air leakage.  This 30 days encompassed the nights between 17 November and 02 June 2019.  He needs new equipment now because his machine has given him "the message " Sleeprelevant medical history: no Nocturia/ status post TURP.  Familymedical /sleep history:no other family member on CPAP with OSA, no insomnia, sleep walkers.  Social history:Patient is retired  and lives in a household with spouse at [place] / [place] for over 10 years. The couple has an adult daughter.  Tobacco use yes- cigars . ETOH use : [place] H and H , 2 drinks at supper ,  Caffeine intake in form of Coffee( decaffeinated 1 cup ) Soda( rare) Tea (no ) and no energy drinks. Regular exercise in local GYM- now just walking , sometimes on treadmill at home.      Sleep habits are as follows:The patient's dinner time is between 7 PM. The patient goes to bed at 9 PM and continues to sleep for several  hours, wakes now rarely bathroom breaks.  Gets 8-9  hours of sleep, the bedroom is cool, quiet and dark.  The preferred sleep position is right lateral , with the support of 1 pillow.  Dreams are reportedly nfrequent.  6-7 AM is the usual rise time. The patient wakes up spontaneously.  He reports  feeling refreshed and restored in AM,but  with symptoms such as dry mouth, no morning headaches REVIEW OF SYSTEMS: Out of a complete 14 system review of symptoms, the patient complains only of the following symptoms, and all other reviewed systems are negative.  FSS ESS 8  ALLERGIES: No Known Allergies  HOME MEDICATIONS: Outpatient Medications Prior to Visit  Medication Sig Dispense Refill  . alendronate (FOSAMAX) 70 MG tablet Take 70 mg by mouth every 7 (seven) days. Take with a full glass of water on an empty stomach.     Marland Kitchen amLODipine (NORVASC) 10 MG tablet Take 1 tablet (10 mg total) by mouth daily. 90 tablet 3  . Calcium Carbonate-Vitamin D (CALCIUM + D PO) Take 1,200 mg by mouth.    Marland Kitchen CINNAMON PO Take 1,000 mg by mouth daily.      . Continuous Blood Gluc Sensor (FREESTYLE LIBRE 14 DAY SENSOR) MISC APPLY TO UPPER ARM EVERY 14 DAYS AS DIRECTED    . Cyanocobalamin (VITAMIN B12 PO) Take 1 tablet by mouth daily.    Marland Kitchen doxazosin (CARDURA) 1 MG tablet Take 1 mg by mouth at bedtime.    . ferrous sulfate 325 (65 FE) MG tablet Take 325 mg by mouth daily with breakfast.    . furosemide (LASIX) 40 MG tablet Take 40 mg by mouth daily.      Marland Kitchen gabapentin (NEURONTIN) 300 MG capsule Take 600 mg by mouth at bedtime.     . hydrOXYzine (ATARAX/VISTARIL) 25 MG tablet Take 25 mg by mouth at bedtime.     . INSULIN ASPART [place] Inject 30 Units into the skin as needed.     Marland Kitchen losartan (COZAAR) 100 MG tablet Take 100 mg by mouth daily.    . NON FORMULARY CPAP at night    . pantoprazole (PROTONIX) 40 MG tablet Take 1 tablet (40 mg total) by mouth daily. 30 tablet 5  . pravastatin (PRAVACHOL) 40 MG tablet Take 40 mg by mouth daily.      Marland Kitchen warfarin (COUMADIN) 3 MG tablet  Take 3 mg by mouth daily.      No facility-administered medications prior to visit.    PAST MEDICAL HISTORY: Past Medical History:  Diagnosis Date  . Aortic insufficiency   . Atrial fibrillation ([place])   . Barrett esophagus   . Chronic anticoagulation   . Diabetes mellitus   . Dizziness   . Dyspnea   . GERD (gastroesophageal reflux disease)   . Hiatal hernia   . Hyperlipidemia   . Hyperplastic colon polyp 2006  . Hypertension   . Osteopenia   . Sinus bradycardia 05/05/2013  . Sleep apnea     PAST SURGICAL  HISTORY: Past Surgical History:  Procedure Laterality Date  . BLADDER SURGERY    . CARDIOVASCULAR STRESS TEST  04/19/2010   EF 72%  . CARDIOVERSION  2004  . CARPAL TUNNEL RELEASE    . COLON SURGERY    . COLONOSCOPY  08/23/2009   Normal   . EYE SURGERY     MULTIPLE WITH ENUCLEATION ON THE LEFT  . PROSTATE SURGERY    . SHOULDER ARTHROSCOPY     RIGHT SHOULDER  . TRANSTHORACIC ECHOCARDIOGRAM  04/19/2010   EF 55-60%  . TUMOR REMOVAL FROM STOMACH    . US ECHOCARDIOGRAPHY  03/31/2003   EF 60-65%    FAMILY HISTORY: Family History  Problem Relation Age of Onset  . Heart failure Mother   . Atrial fibrillation Mother   . Heart attack Father        X2  . Heart attack Sister 49    SOCIAL HISTORY: Social History   Socioeconomic History  . Marital status: Married    Spouse name: Not on file  . Number of children: 4  . Years of education: Not on file  . Highest education level: Not on file  Occupational History  . Occupation: Retired    Fish farm manager: RETIRED  Tobacco Use  . Smoking status: Current Every Day Smoker    Types: Cigars  . Smokeless tobacco: Never Used  Substance and Sexual Activity  . Alcohol use: No  . Drug use: No  . Sexual activity: Not on file  Other Topics Concern  . Not on file  Social History Narrative  . Not on file   Social Determinants of Health   Financial Resource Strain:   . Difficulty of Paying Living Expenses:   Food  Insecurity:   . Worried About Charity fundraiser in the Last Year:   . Arboriculturist in the Last Year:   Transportation Needs:   . Film/video editor (Medical):   Marland Kitchen Lack of Transportation (Non-Medical):   Physical Activity:   . Days of Exercise per Week:   . Minutes of Exercise per Session:   Stress:   . Feeling of Stress :   Social Connections:   . Frequency of Communication with Friends and Family:   . Frequency of Social Gatherings with Friends and Family:   . Attends Religious Services:   . Active Member of Clubs or Organizations:   . Attends Archivist Meetings:   Marland Kitchen Marital Status:   Intimate Partner Violence:   . Fear of Current or Ex-Partner:   . Emotionally Abused:   Marland Kitchen Physically Abused:   . Sexually Abused:       PHYSICAL EXAM  Vitals:   09/20/19 0910  BP: (!) 141/64  Pulse: 63  Temp: (!) 97.1 F (36.2 C)  Weight: 221 lb (100.2 kg)  Height: 5\' 10"  (1.778 m)   Body mass index is 31.71 kg/m.  Generalized: Well developed, in no acute distress  Chest: Lungs clear to auscultation bilaterally  Neurological examination  Mentation: Alert oriented to time, place, history taking. Follows all commands speech and language fluent Cranial nerve II-XII: Extraocular movements were full, visual field were full on confrontational test Head turning and shoulder shrug  were normal and symmetric. Motor: The motor testing reveals 5 over 5 strength of all 4 extremities. Good symmetric motor tone is noted throughout.  Sensory: Sensory testing is intact to soft touch on all 4 extremities. No evidence of extinction is noted.  Gait and station:  Gait is normal.    DIAGNOSTIC DATA (LABS, IMAGING, TESTING) - I reviewed patient records, labs, notes, testing and imaging myself where available.  Lab Results  Component Value Date   WBC 10.3 05/01/2012   HGB 14.1 05/01/2012   HCT 42.0 05/01/2012   MCV 84.2 05/01/2012   PLT 164.0 05/01/2012      Component Value  Date/Time   NA 135 05/01/2012 1153   K 4.2 05/01/2012 1153   CL 98 05/01/2012 1153   CO2 30 05/01/2012 1153   GLUCOSE 179 (H) 05/01/2012 1153   BUN 22 05/01/2012 1153   CREATININE 1.7 (H) 05/01/2012 1153   CALCIUM 9.1 05/01/2012 1153   PROT 7.5 05/01/2012 1153   ALBUMIN 4.1 05/01/2012 1153   AST 31 05/01/2012 1153   ALT 36 05/01/2012 1153   ALKPHOS 48 05/01/2012 1153   BILITOT 0.8 05/01/2012 1153   GFRNONAA 29 (L) 05/09/2011 1927   GFRAA 33 (L) 05/09/2011 1927   No results found for: CHOL, HDL, LDLCALC, LDLDIRECT, TRIG, CHOLHDL No results found for: HGBA1C Lab Results  Component Value Date   VITAMINB12 360 05/01/2012   Lab Results  Component Value Date   TSH 1.079 12/15/2014      ASSESSMENT AND PLAN 86 y.o. year old male  has a past medical history of Aortic insufficiency, Atrial fibrillation ([facility]), Barrett esophagus, Chronic anticoagulation, Diabetes mellitus, Dizziness, Dyspnea, GERD (gastroesophageal reflux disease), Hiatal hernia, Hyperlipidemia, Hyperplastic colon polyp (2006), Hypertension, Osteopenia, Sinus bradycardia (05/05/2013), and Sleep apnea. here with:  1. OSA on CPAP  - CPAP compliance excellent -Residual AHI is slightly elevated.  Pressure will be adjusted 5-15 -Mask refitting prefers nasal mask - Encourage patient to use CPAP nightly and > 4 hours each night - F/U in 6 months or sooner if needed   I spent 20 minutes of face-to-face and non-face-to-face time with patient.  This included previsit chart review, lab review, study review, order entry, electronic health record documentation, patient education.  Ward Givens, MSN, NP-C 09/20/2019, 9:54 AM [facility] [address] [place], [place] [address] [phone]  I reviewed the above note and documentation by the Nurse Practitioner and agree with the history, exam, assessment and plan as outlined above. I was available for consultation. Star Age, MD, PhD [facility])

## 2019-09-20 NOTE — Progress Notes (Signed)
Community message sent to [employer]. 

## 2019-09-22 DIAGNOSIS — E1129 Type 2 diabetes mellitus with other diabetic kidney complication: Secondary | ICD-10-CM | POA: Diagnosis not present

## 2019-09-22 DIAGNOSIS — Z7901 Long term (current) use of anticoagulants: Secondary | ICD-10-CM | POA: Diagnosis not present

## 2019-09-22 DIAGNOSIS — I480 Paroxysmal atrial fibrillation: Secondary | ICD-10-CM | POA: Diagnosis not present

## 2019-10-15 DIAGNOSIS — R7989 Other specified abnormal findings of blood chemistry: Secondary | ICD-10-CM | POA: Diagnosis not present

## 2019-10-15 DIAGNOSIS — E1129 Type 2 diabetes mellitus with other diabetic kidney complication: Secondary | ICD-10-CM | POA: Diagnosis not present

## 2019-10-15 DIAGNOSIS — Z125 Encounter for screening for malignant neoplasm of prostate: Secondary | ICD-10-CM | POA: Diagnosis not present

## 2019-10-15 DIAGNOSIS — Z0000 Encounter for general adult medical examination without abnormal findings: Secondary | ICD-10-CM | POA: Diagnosis not present

## 2019-10-22 DIAGNOSIS — Z8719 Personal history of other diseases of the digestive system: Secondary | ICD-10-CM | POA: Diagnosis not present

## 2019-10-22 DIAGNOSIS — N1832 Chronic kidney disease, stage 3b: Secondary | ICD-10-CM | POA: Diagnosis not present

## 2019-10-22 DIAGNOSIS — I129 Hypertensive chronic kidney disease with stage 1 through stage 4 chronic kidney disease, or unspecified chronic kidney disease: Secondary | ICD-10-CM | POA: Diagnosis not present

## 2019-10-22 DIAGNOSIS — Z0000 Encounter for general adult medical examination without abnormal findings: Secondary | ICD-10-CM | POA: Diagnosis not present

## 2019-10-22 DIAGNOSIS — I10 Essential (primary) hypertension: Secondary | ICD-10-CM | POA: Diagnosis not present

## 2019-10-22 DIAGNOSIS — R82998 Other abnormal findings in urine: Secondary | ICD-10-CM | POA: Diagnosis not present

## 2019-11-17 NOTE — Progress Notes (Signed)
Virtual Visit via Video Note   This visit type was conducted due to national recommendations for restrictions regarding the COVID-19 Pandemic (e.g. social distancing) in an effort to limit this patient's exposure and mitigate transmission in our community.  Due to his co-morbid illnesses, this patient is at least at moderate risk for complications without adequate follow up.  This format is felt to be most appropriate for this patient at this time.  All issues noted in this document were discussed and addressed.  A limited physical exam was performed with this format.  Please refer to the patient's chart for his consent to telehealth for [facility].   The patient was identified using 2 identifiers.  Date:  11/18/2019   ID:  Anthony Ho, DOB 09/20/1931, MRN [id number]  Patient Location: Home Provider Location: Home  PCP:  Leanna Battles, MD  Cardiologist:  Elleen Coulibaly [place] MD Electrophysiologist:  None   Evaluation Performed:  Follow-Up Visit  Chief Complaint:  AFib  History of Present Illness:    Anthony Ho is a 86 y.o. male with a history of atrial fibrillation and underwent cardioversion in 2005. His atrial fibrillation recurred and he was placed on amiodarone which converted him  medically. When seen in November 2014 he had severe bradycardia with HR in the low 40s. Amiodarone was stopped. About 3 weeks later he was in atrial fibrillation with HR 66 bpm. He is on coumadin. He has since been treated with rate control and anticoagulation.  In November 2019  he was scheduled for eye surgery. He was noted to be bradycardic and surgery was cancelled. Seen here and Holter monitor showed Afib with controlled rate. Minimal HR 49. Mean 62. No pause > 2.8 seconds. Echo showed normal LV function with moderate AI and moderate pulmonary HTN. He was cleared for surgery.   He reports that since April he has developed significant swelling in his legs and abdomen. Has increased SOB. Dr Philip Aspen  increased his lasix to 80 mg daily but he hasn't noted any improvement. Has a dry cough. No chest pain or dizziness. Daughter notes salt intake has been an issue.   The patient does not have symptoms concerning for COVID-19 infection (fever, chills, cough, or new shortness of breath).    Past Medical History:  Diagnosis Date  . Aortic insufficiency   . Atrial fibrillation ([place])   . Barrett esophagus   . Chronic anticoagulation   . Diabetes mellitus   . Dizziness   . Dyspnea   . GERD (gastroesophageal reflux disease)   . Hiatal hernia   . Hyperlipidemia   . Hyperplastic colon polyp 2006  . Hypertension   . Osteopenia   . Sinus bradycardia 05/05/2013  . Sleep apnea    Past Surgical History:  Procedure Laterality Date  . BLADDER SURGERY    . CARDIOVASCULAR STRESS TEST  04/19/2010   EF 72%  . CARDIOVERSION  2004  . CARPAL TUNNEL RELEASE    . COLON SURGERY    . COLONOSCOPY  08/23/2009   Normal   . EYE SURGERY     MULTIPLE WITH ENUCLEATION ON THE LEFT  . PROSTATE SURGERY    . SHOULDER ARTHROSCOPY     RIGHT SHOULDER  . TRANSTHORACIC ECHOCARDIOGRAM  04/19/2010   EF 55-60%  . TUMOR REMOVAL FROM STOMACH    . US ECHOCARDIOGRAPHY  03/31/2003   EF 60-65%     Current Meds  Medication Sig  . alendronate (FOSAMAX) 70 MG tablet Take 70 mg  by mouth every 7 (seven) days. Take with a full glass of water on an empty stomach.   . Calcium Carbonate-Vitamin D (CALCIUM + D PO) Take 1,200 mg by mouth.  Marland Kitchen CINNAMON PO Take 1,000 mg by mouth daily.    . Continuous Blood Gluc Sensor (FREESTYLE LIBRE 14 DAY SENSOR) MISC APPLY TO UPPER ARM EVERY 14 DAYS AS DIRECTED  . Cyanocobalamin (VITAMIN B12 PO) Take 1 tablet by mouth daily.  . ferrous sulfate 325 (65 FE) MG tablet Take 325 mg by mouth daily with breakfast.  . gabapentin (NEURONTIN) 300 MG capsule Take 3 tablets ( 900 mg ) at bedtime  . hydrOXYzine (ATARAX/VISTARIL) 50 MG tablet Take 1 tablet (50 mg total) by mouth at bedtime.  . INSULIN  ASPART [place] Inject 30 Units into the skin as needed.   Marland Kitchen losartan (COZAAR) 100 MG tablet Take 100 mg by mouth daily.  . NON FORMULARY CPAP at night  . pantoprazole (PROTONIX) 40 MG tablet Take 1 tablet (40 mg total) by mouth daily.  . pravastatin (PRAVACHOL) 40 MG tablet Take 40 mg by mouth daily.    Marland Kitchen warfarin (COUMADIN) 3 MG tablet Take 3 mg by mouth daily.   . [DISCONTINUED] amLODipine (NORVASC) 10 MG tablet Take 1 tablet (10 mg total) by mouth daily.  . [DISCONTINUED] doxazosin (CARDURA) 1 MG tablet Take 1 mg by mouth at bedtime.  . [DISCONTINUED] furosemide (LASIX) 40 MG tablet Take 40 mg twice a day     Allergies:   Patient has no known allergies.   Social History   Tobacco Use  . Smoking status: Current Every Day Smoker    Types: Cigars  . Smokeless tobacco: Never Used  Substance Use Topics  . Alcohol use: No  . Drug use: No     Family Hx: The patient's family history includes Atrial fibrillation in his mother; Heart attack in his father; Heart attack (age of onset: 27) in his sister; Heart failure in his mother.  ROS:   Please see the history of present illness.     All other systems reviewed and are negative.   Prior CV studies:   The following studies were reviewed today:  Echo 05/02/18: Study Conclusions  - Left ventricle: The cavity size was normal. There was moderate concentric hypertrophy. Systolic function was normal. The estimated ejection fraction was in the range of 60% to 65%. Wall motion was normal; there were no regional wall motion abnormalities. - Aortic valve: Transvalvular velocity was within the normal range. There was no stenosis. There was moderate regurgitation. - Aorta: Ascending aortic diameter: 41 mm (S). - Ascending aorta: The ascending aorta was mildly dilated. - Mitral valve: Calcified annulus. Transvalvular velocity was within the normal range. There was no evidence for stenosis. There was trivial regurgitation. - Left  atrium: The atrium was severely dilated. - Right ventricle: The cavity size was mildly dilated. Wall thickness was normal. Systolic function was normal. - Right atrium: The atrium was severely dilated. - Atrial septum: No defect or patent foramen ovale was identified by color flow Doppler. - Tricuspid valve: There was mild regurgitation. - Pulmonary arteries: Systolic pressure was moderately increased. PA peak pressure: 52 mm Hg (S).  Holter monitor 05/13/19:Study Highlights    Atrial fibrillation with controlled ventricular response. Average HR 62. Fastest HR 103. slowest sustained bradycardia 49 bpm. longest pause 2.8 seconds  Rare PVC   Echo 08/26/19: IMPRESSIONS    1. Left ventricular ejection fraction, by estimation, is 60 to  65%. The  left ventricle has normal function. The left ventricle has no regional  wall motion abnormalities. There is moderate concentric left ventricular  hypertrophy. Left ventricular  diastolic parameters are indeterminate.  2. Right ventricular systolic function is normal. The right ventricular  size is normal. There is mildly elevated pulmonary artery systolic  pressure. The estimated right ventricular systolic pressure is 123XX123 mmHg.  3. Left atrial size was severely dilated.  4. Right atrial size was severely dilated.  5. The mitral valve is abnormal. Trivial mitral valve regurgitation. No  evidence of mitral stenosis.  6. Tricuspid valve regurgitation is moderate.  7. The aortic valve is normal in structure. Aortic valve regurgitation is  moderate. No aortic stenosis is present.  8. Aortic dilatation noted. There is mild dilatation of the ascending  aorta measuring 40 mm.  9. The inferior vena cava is normal in size with greater than 50%  respiratory variability, suggesting right atrial pressure of 3 mmHg.   Comparison(s): A prior study was performed on 05/01/2018. No significant  change from prior study in LV function or  degree of aortic regurgitation.   Labs/Other Tests and Data Reviewed:    EKG:  No ECG reviewed.  Recent Labs: No results found for requested labs within last 8760 hours.   Recent Lipid Panel No results found for: CHOL, TRIG, HDL, CHOLHDL, LDLCALC, LDLDIRECT   Labs reviewed from 10/18/14: cholesterol 128, triglycerides 114, HDL 36, LDL 69. CMET normal. A1c dated 11/17/15: 6.8%.  Dated 06/27/16: cholesterol 149, triglycerides 182, HDL 35, LDL 78. Creatinine 1.5. Other chemistries normal. Hgb 15.1.  Dated 12/25/16: A1c 6.3%. Dated 07/04/17: cholesterol 144, triglycerides 163, HDL 41, LDL 70. TSH normal Dated 02/12/18: A1c 6.2%.  Dated 03/19/18: creatinine 1.3. otherwise chemistries and CBC normal. Dated 10/02/18: cholesterol 134, triglycerides 106, HDL 37, LDL 76. A1c 6.8%. creatinine 1.5. otherwise Chemistries, CBC, TSH normal. Dated 07/06/19: A1c 7.2%. glucose 155.  Dated 10/15/19: cholesterol 109, triglycerides 97, HDL 32, LDL 55, A1c 6.6%. creatinine 1.7. Hgb 13. ALT and TSH normal.   Wt Readings from Last 3 Encounters:  11/18/19 222 lb (100.7 kg)  09/20/19 221 lb (100.2 kg)  08/12/19 205 lb (93 kg)     Objective:    Vital Signs:  BP (!) 158/70   Pulse 66   Ht 5\' 10"  (1.778 m)   Wt 222 lb (100.7 kg)   BMI 31.85 kg/m    VITAL SIGNS:  reviewed  General elderly WM in NAD HEENT normal Respirations unlabored. Abdomen protuberant and appears swollen LE with 2+ pitting edema.  ASSESSMENT & PLAN:    1. Atrial fibrillation. Chronic. HR  responds normally to exercise.  Rate controlled on no rate control therapy. Continue anticoagulation on Coumadin.   2. Hypertension, elevated  3. Diabetes mellitus- last A1c 6.6%. states he only uses insulin when sugar > 200.   4. Aortic insufficiency. Moderate. Normal LV size. Echo in March showed no significant change  5. Acute on chronic diastolic CHF. Significant swelling and weight gain. Discussed importance of sodium restriction < 2 grams  daily and fluid restriction to 1.5 liters/day. Will increase lasix to 80 mg bid. Decrease amlodipine to 5 mg daily. Increase Cardura to 2 mg qhs for BP. May need to switch amlodipine to hydralazine if BP remains high. Will check CXR. Will arrange in office follow up in 2 weeks with CMET , CBC, and BNP  COVID-19 Education: The signs and symptoms of COVID-19 were discussed with the patient and  how to seek care for testing (follow up with PCP or arrange E-visit).  The importance of social distancing was discussed today.  Time:   Today, I have spent 20 minutes with the patient with telehealth technology discussing the above problems.     Medication Adjustments/Labs and Tests Ordered: Current medicines are reviewed at length with the patient today.  Concerns regarding medicines are outlined above.   Tests Ordered: No orders of the defined types were placed in this encounter.   Medication Changes: Meds ordered this encounter  Medications  . furosemide (LASIX) 80 MG tablet    Sig: Take 1 tablet (80 mg total) by mouth 2 (two) times daily.    Dispense:  180 tablet    Refill:  3  . doxazosin (CARDURA) 2 MG tablet    Sig: Take 1 tablet (2 mg total) by mouth at bedtime.    Dispense:  90 tablet    Refill:  3  . amLODipine (NORVASC) 5 MG tablet    Sig: Take 1 tablet (5 mg total) by mouth daily.    Dispense:  90 tablet    Refill:  3    Follow Up:  In Person in 2 week(s)  Signed, Dareld Mcauliffe [place], MD  11/18/2019 8:46 AM    [facility]

## 2019-11-18 ENCOUNTER — Telehealth (INDEPENDENT_AMBULATORY_CARE_PROVIDER_SITE_OTHER): Payer: Medicare Other | Admitting: Cardiology

## 2019-11-18 ENCOUNTER — Ambulatory Visit
Admission: RE | Admit: 2019-11-18 | Discharge: 2019-11-18 | Disposition: A | Discharge status: Home / Self Care | Payer: Medicare Other | Source: Ambulatory Visit | Attending: Cardiology | Admitting: Cardiology

## 2019-11-18 ENCOUNTER — Telehealth: Payer: Self-pay

## 2019-11-18 ENCOUNTER — Encounter: Payer: Self-pay | Admitting: Cardiology

## 2019-11-18 VITALS — BP 158/70 | HR 66 | Ht 70.0 in | Wt 222.0 lb

## 2019-11-18 DIAGNOSIS — I351 Nonrheumatic aortic (valve) insufficiency: Secondary | ICD-10-CM

## 2019-11-18 DIAGNOSIS — I482 Chronic atrial fibrillation, unspecified: Secondary | ICD-10-CM

## 2019-11-18 DIAGNOSIS — I10 Essential (primary) hypertension: Secondary | ICD-10-CM

## 2019-11-18 DIAGNOSIS — R0602 Shortness of breath: Secondary | ICD-10-CM

## 2019-11-18 DIAGNOSIS — I5033 Acute on chronic diastolic (congestive) heart failure: Secondary | ICD-10-CM

## 2019-11-18 DIAGNOSIS — J90 Pleural effusion, not elsewhere classified: Secondary | ICD-10-CM | POA: Diagnosis not present

## 2019-11-18 MED ORDER — FUROSEMIDE 80 MG PO TABS: 80.0000 mg | ORAL_TABLET | Freq: Two times a day (BID) | ORAL | 3 refills | Status: DC

## 2019-11-18 MED ORDER — DOXAZOSIN MESYLATE 2 MG PO TABS: 2.0000 mg | ORAL_TABLET | Freq: Every day | ORAL | 3 refills | Status: DC

## 2019-11-18 MED ORDER — AMLODIPINE BESYLATE 5 MG PO TABS: 5.0000 mg | ORAL_TABLET | Freq: Every day | ORAL | 3 refills | Status: DC

## 2019-11-18 NOTE — Addendum Note (Signed)
Addended by: Kathyrn Lass on: 11/18/2019 10:04 AM   Modules accepted: Orders

## 2019-11-18 NOTE — Patient Instructions (Addendum)
Medication Instructions:  Decrease Amlodipine to 5 mg daily Increase Furosemide to 80 mg twice a day Increase Cardura to 2 mg at night Continue all other medications  *If you need a refill on your cardiac medications before your next appointment, please call your pharmacy*   Lab Work: Cbc,cmet,bnp in 2 weeks  Lab orders enclosed    Testing/Procedures: Chest xray today 6/3 at [facility] on [address].   Follow-Up: At [facility], you and your health needs are our priority.  As part of our continuing mission to provide you with exceptional heart care, we have created designated Provider Care Teams.  These Care Teams include your primary Cardiologist (physician) and Advanced Practice Providers (APPs -  Physician Assistants and Nurse Practitioners) who all work together to provide you with the care you need, when you need it.  We recommend signing up for the patient portal called "MyChart".  Sign up information is provided on this After Visit Summary.  MyChart is used to connect with patients for Virtual Visits (Telemedicine).  Patients are able to view lab/test results, encounter notes, upcoming appointments, etc.  Non-urgent messages can be sent to your provider as well.   To learn more about what you can do with MyChart, go to [email or web address].    Your next appointment: 2 weeks    The format for your next appointment: Office   Provider:  Dr.Jordan

## 2019-11-18 NOTE — Telephone Encounter (Signed)
  Patient Consent for Virtual Visit         Anthony Ho has provided verbal consent on 11/18/2019 for a virtual visit (video or telephone).   CONSENT FOR VIRTUAL VISIT FOR:  Anthony Ho  By participating in this virtual visit I agree to the following:  I hereby voluntarily request, consent and authorize [place] and its employed or contracted physicians, physician assistants, nurse practitioners or other licensed health care professionals (the Practitioner), to provide me with telemedicine health care services (the "Services") as deemed necessary by the treating Practitioner. I acknowledge and consent to receive the Services by the Practitioner via telemedicine. I understand that the telemedicine visit will involve communicating with the Practitioner through live audiovisual communication technology and the disclosure of certain medical information by electronic transmission. I acknowledge that I have been given the opportunity to request an in-person assessment or other available alternative prior to the telemedicine visit and am voluntarily participating in the telemedicine visit.  I understand that I have the right to withhold or withdraw my consent to the use of telemedicine in the course of my care at any time, without affecting my right to future care or treatment, and that the Practitioner or I may terminate the telemedicine visit at any time. I understand that I have the right to inspect all information obtained and/or recorded in the course of the telemedicine visit and may receive copies of available information for a reasonable fee.  I understand that some of the potential risks of receiving the Services via telemedicine include:  Marland Kitchen Delay or interruption in medical evaluation due to technological equipment failure or disruption; . Information transmitted may not be sufficient (e.g. poor resolution of images) to allow for appropriate medical decision making by the Practitioner; and/or   . In rare instances, security protocols could fail, causing a breach of personal health information.  Furthermore, I acknowledge that it is my responsibility to provide information about my medical history, conditions and care that is complete and accurate to the best of my ability. I acknowledge that Practitioner's advice, recommendations, and/or decision may be based on factors not within their control, such as incomplete or inaccurate data provided by me or distortions of diagnostic images or specimens that may result from electronic transmissions. I understand that the practice of medicine is not an exact science and that Practitioner makes no warranties or guarantees regarding treatment outcomes. I acknowledge that a copy of this consent can be made available to me via my patient portal ([place]), or I can request a printed copy by calling the office of [place].    I understand that my insurance will be billed for this visit.   I have read or had this consent read to me. . I understand the contents of this consent, which adequately explains the benefits and risks of the Services being provided via telemedicine.  . I have been provided ample opportunity to ask questions regarding this consent and the Services and have had my questions answered to my satisfaction. . I give my informed consent for the services to be provided through the use of telemedicine in my medical care

## 2019-11-19 ENCOUNTER — Encounter: Payer: Self-pay | Admitting: Neurology

## 2019-11-19 ENCOUNTER — Other Ambulatory Visit: Payer: Self-pay

## 2019-11-19 DIAGNOSIS — I351 Nonrheumatic aortic (valve) insufficiency: Secondary | ICD-10-CM

## 2019-11-19 DIAGNOSIS — I5033 Acute on chronic diastolic (congestive) heart failure: Secondary | ICD-10-CM

## 2019-11-19 DIAGNOSIS — R0602 Shortness of breath: Secondary | ICD-10-CM

## 2019-11-19 DIAGNOSIS — I4891 Unspecified atrial fibrillation: Secondary | ICD-10-CM

## 2019-11-19 DIAGNOSIS — I10 Essential (primary) hypertension: Secondary | ICD-10-CM

## 2019-11-19 DIAGNOSIS — I482 Chronic atrial fibrillation, unspecified: Secondary | ICD-10-CM

## 2019-11-19 NOTE — Progress Notes (Signed)
Cbc

## 2019-11-22 ENCOUNTER — Telehealth: Payer: Self-pay

## 2019-11-22 NOTE — Telephone Encounter (Signed)
See mychart message from 11/22/2019 for further documentation on this.

## 2019-11-22 NOTE — Telephone Encounter (Signed)
Received this message via my chart for the pt.  This is Anthony Ho, Prithvi Rochette's daughter.  Dad had an Echo and was told he had regurgitation.  Dr. Philip Aspen said he would see about having O2 added to his CPAP.  Dad has not heard anything further about this.  He is having much difficulty breathing, coughing, swelling in legs and abdomen.  Dr. Martinique is adjusting his medications and will follow up with regard to fluid build up.  I asked Dr. Martinique about O2 on his CPAP.  They recommended that the Dr. monitoring the CPAP order O2 to be added.  Is this something that you could facilitate? Thank you for your help, Billie Lade fwd to Dr. Brett Fairy to review.

## 2019-11-26 DIAGNOSIS — I5033 Acute on chronic diastolic (congestive) heart failure: Secondary | ICD-10-CM | POA: Diagnosis not present

## 2019-11-26 DIAGNOSIS — I10 Essential (primary) hypertension: Secondary | ICD-10-CM | POA: Diagnosis not present

## 2019-11-26 DIAGNOSIS — I482 Chronic atrial fibrillation, unspecified: Secondary | ICD-10-CM | POA: Diagnosis not present

## 2019-11-26 DIAGNOSIS — I351 Nonrheumatic aortic (valve) insufficiency: Secondary | ICD-10-CM | POA: Diagnosis not present

## 2019-11-26 NOTE — Progress Notes (Signed)
Anthony Ho Date of Birth: 1931/08/29 Medical Record #[id number]  History of Present Illness: Anthony Ho is seen for  followup of Afib. He has a history of atrial fibrillation and underwent cardioversion in 2005. His atrial fibrillation recurred and he was placed on amiodarone which converted him  medically. When seen in November 2014 he had severe bradycardia with HR in the low 40s. Amiodarone was stopped. About 3 weeks later he was in atrial fibrillation with HR 66 bpm. He is on coumadin. He has since been treated with rate control and anticoagulation.  In November 2019  he was scheduled for eye surgery. He was noted to be bradycardic and surgery was cancelled. Seen here and Holter monitor showed Afib with controlled rate. Minimal HR 49. Mean 62. No pause > 2.8 seconds. Echo showed normal LV function with moderate AI and moderate pulmonary HTN. He was cleared for surgery.   He had a Televisit on June 3 and reported increased dyspnea and LE edema since April that did not respond to increased lasix. He was also apparently taking 20 mg of amlodipine. We increased Cardura to 2 mg qhs, lasix to 80 mg bid and dropped amlodipine to 5 mg daily. He is seen today for follow up evaluation.   He is seen with his wife and daughter. He is better. Swelling and abdominal girth are decreased. He has lost 20 lbs. He still has persistent SOB. No dizziness or syncope. Wonders if something is wrong with his stomach.   Current Outpatient Medications on File Prior to Visit  Medication Sig Dispense Refill  . alendronate (FOSAMAX) 70 MG tablet Take 70 mg by mouth every 7 (seven) days. Take with a full glass of water on an empty stomach.     Marland Kitchen amLODipine (NORVASC) 5 MG tablet Take 1 tablet (5 mg total) by mouth daily. 90 tablet 3  . Calcium Carbonate-Vitamin D (CALCIUM + D PO) Take 1,200 mg by mouth.    Marland Kitchen CINNAMON PO Take 1,000 mg by mouth daily.      . Continuous Blood Gluc Sensor (FREESTYLE LIBRE 14 DAY SENSOR) MISC  APPLY TO UPPER ARM EVERY 14 DAYS AS DIRECTED    . Cyanocobalamin (VITAMIN B12 PO) Take 1 tablet by mouth daily.    Marland Kitchen doxazosin (CARDURA) 2 MG tablet Take 1 tablet (2 mg total) by mouth at bedtime. 90 tablet 3  . ferrous sulfate 325 (65 FE) MG tablet Take 325 mg by mouth daily with breakfast.    . furosemide (LASIX) 80 MG tablet Take 1 tablet (80 mg total) by mouth 2 (two) times daily. 180 tablet 3  . gabapentin (NEURONTIN) 300 MG capsule Take 3 tablets ( 900 mg ) at bedtime    . hydrOXYzine (ATARAX/VISTARIL) 50 MG tablet Take 1 tablet (50 mg total) by mouth at bedtime. 30 tablet 0  . INSULIN ASPART  Inject 30 Units into the skin as needed.     Marland Kitchen losartan (COZAAR) 100 MG tablet Take 100 mg by mouth daily.    . NON FORMULARY CPAP at night    . pantoprazole (PROTONIX) 40 MG tablet Take 1 tablet (40 mg total) by mouth daily. 30 tablet 5  . pravastatin (PRAVACHOL) 40 MG tablet Take 40 mg by mouth daily.      Marland Kitchen warfarin (COUMADIN) 3 MG tablet Take 3 mg by mouth daily.      No current facility-administered medications on file prior to visit.    No Known Allergies  Past Medical  History:  Diagnosis Date  . Aortic insufficiency   . Atrial fibrillation ([place])   . Barrett esophagus   . Chronic anticoagulation   . Diabetes mellitus   . Dizziness   . Dyspnea   . GERD (gastroesophageal reflux disease)   . Hiatal hernia   . Hyperlipidemia   . Hyperplastic colon polyp 2006  . Hypertension   . Osteopenia   . Sinus bradycardia 05/05/2013  . Sleep apnea     Past Surgical History:  Procedure Laterality Date  . BLADDER SURGERY    . CARDIOVASCULAR STRESS TEST  04/19/2010   EF 72%  . CARDIOVERSION  2004  . CARPAL TUNNEL RELEASE    . COLON SURGERY    . COLONOSCOPY  08/23/2009   Normal   . EYE SURGERY     MULTIPLE WITH ENUCLEATION ON THE LEFT  . PROSTATE SURGERY    . SHOULDER ARTHROSCOPY     RIGHT SHOULDER  . TRANSTHORACIC ECHOCARDIOGRAM  04/19/2010   EF 55-60%  . TUMOR REMOVAL FROM  STOMACH    . US ECHOCARDIOGRAPHY  03/31/2003   EF 60-65%    Social History   Tobacco Use  Smoking Status Current Every Day Smoker  . Types: Cigars  Smokeless Tobacco Never Used    Social History   Substance and Sexual Activity  Alcohol Use No    Family History  Problem Relation Age of Onset  . Heart failure Mother   . Atrial fibrillation Mother   . Heart attack Father        X2  . Heart attack Sister 13    Review of Systems: As noted in history of present illness.   All other systems were reviewed and are negative.  Physical Exam: BP (!) 150/56   Pulse (!) 43   Ht 5\' 10"  (1.778 m)   Wt 201 lb 6.4 oz (91.4 kg)   BMI 28.90 kg/m  GENERAL:  Well appearing WM in NAD HEENT:  PERRL, EOMI, sclera are clear. Oropharynx is clear. NECK:  No jugular venous distention, carotid upstroke brisk and symmetric, no bruits, no thyromegaly or adenopathy LUNGS:  Clear to auscultation bilaterally CHEST:  Unremarkable HEART:  IRRR,  PMI not displaced or sustained,S1 and S2 within normal limits, no S3, no S4: no clicks, no rubs, no murmurs ABD:  Soft, nontender. Mildly distended. BS +, no masses or bruits. No hepatomegaly, no splenomegaly EXT:  2 + pulses throughout, 1+ edema, no cyanosis no clubbing SKIN:  Warm and dry.  No rashes NEURO:  Alert and oriented x 3. Cranial nerves II through XII intact. PSYCH:  Cognitively intact      LABORATORY DATA:  Lab Results  Component Value Date   WBC 5.8 11/26/2019   HGB 13.7 11/26/2019   HCT 41.3 11/26/2019   PLT 116 (L) 11/26/2019   GLUCOSE 130 (H) 11/26/2019   ALT 10 11/26/2019   AST 17 11/26/2019   NA 136 11/26/2019   K 4.2 11/26/2019   CL 97 11/26/2019   CREATININE 1.93 (H) 11/26/2019   BUN 31 (H) 11/26/2019   CO2 22 11/26/2019   TSH 1.079 12/15/2014   INR 2.56 (H) 05/09/2011     Labs reviewed from 10/18/14: cholesterol 128, triglycerides 114, HDL 36, LDL 69. CMET normal. A1c dated 11/17/15: 6.8%.  Dated 06/27/16: cholesterol  149, triglycerides 182, HDL 35, LDL 78. Creatinine 1.5. Other chemistries normal. Hgb 15.1.  Dated 12/25/16: A1c 6.3%. Dated 07/04/17: cholesterol 144, triglycerides 163, HDL 41, LDL 70. TSH  normal Dated 02/12/18: A1c 6.2%.  Dated 03/19/18: creatinine 1.3. otherwise chemistries and CBC normal. Dated 10/02/18: cholesterol 134, triglycerides 106, HDL 37, LDL 76. A1c 6.8%. creatinine 1.5. otherwise Chemistries, CBC, TSH normal. Dated 07/06/19: A1c 7.2%. glucose 155.   Ecg today shows Afib with slow rate 43 bpm. Old septal infarct. I have personally reviewed and interpreted this study.    Echo 05/02/18: Study Conclusions  - Left ventricle: The cavity size was normal. There was moderate   concentric hypertrophy. Systolic function was normal. The   estimated ejection fraction was in the range of 60% to 65%. Wall   motion was normal; there were no regional wall motion   abnormalities. - Aortic valve: Transvalvular velocity was within the normal range.   There was no stenosis. There was moderate regurgitation. - Aorta: Ascending aortic diameter: 41 mm (S). - Ascending aorta: The ascending aorta was mildly dilated. - Mitral valve: Calcified annulus. Transvalvular velocity was   within the normal range. There was no evidence for stenosis.   There was trivial regurgitation. - Left atrium: The atrium was severely dilated. - Right ventricle: The cavity size was mildly dilated. Wall   thickness was normal. Systolic function was normal. - Right atrium: The atrium was severely dilated. - Atrial septum: No defect or patent foramen ovale was identified   by color flow Doppler. - Tricuspid valve: There was mild regurgitation. - Pulmonary arteries: Systolic pressure was moderately increased.   PA peak pressure: 52 mm Hg (S).  Holter monitor 05/12/18:Study Highlights    Atrial fibrillation with controlled ventricular response. Average HR 62. Fastest HR 103. slowest sustained bradycardia 49 bpm. longest  pause 2.8 seconds  Rare PVC     Echo 08/26/19: IMPRESSIONS    1. Left ventricular ejection fraction, by estimation, is 60 to 65%. The  left ventricle has normal function. The left ventricle has no regional  wall motion abnormalities. There is moderate concentric left ventricular  hypertrophy. Left ventricular  diastolic parameters are indeterminate.  2. Right ventricular systolic function is normal. The right ventricular  size is normal. There is mildly elevated pulmonary artery systolic  pressure. The estimated right ventricular systolic pressure is 37.1 mmHg.  3. Left atrial size was severely dilated.  4. Right atrial size was severely dilated.  5. The mitral valve is abnormal. Trivial mitral valve regurgitation. No  evidence of mitral stenosis.  6. Tricuspid valve regurgitation is moderate.  7. The aortic valve is normal in structure. Aortic valve regurgitation is  moderate. No aortic stenosis is present.  8. Aortic dilatation noted. There is mild dilatation of the ascending  aorta measuring 40 mm.  9. The inferior vena cava is normal in size with greater than 50%  respiratory variability, suggesting right atrial pressure of 3 mmHg.   Comparison(s): A prior study was performed on 05/01/2018. No significant  change from prior study in LV function or degree of aortic regurgitation.   Assessment / Plan: 1. Atrial fibrillation. Chronic. HR is slow to 43 today. Has had bradycardia before but Holter in 2019 did not indicate need for pacemaker. Given HR today and recent CHF exacerbation may need to consider pacemaker. Will have him wear a 24 hour Holter monitor. He is on no rate slowing medication.    Continue anticoagulation on Coumadin.   2. Acute on chronic diastolic CHF. Recent significant edema. Right pleural effusion on CXR. Good response to diuretics. He was on very high amlodipine dose by mistake and this was reduced to  5 mg daily. BNP elevated 329. Will continue higher  dose of lasix 80 mg bid. Repeat CXR to assess resolution of effusion. EF normal by Echo in March.   3. Diabetes mellitus- last A1c 7.2%. states he only uses insulin when sugar > 200.   4. Aortic insufficiency. Moderate. Normal LV size. No change on Echo in March.   5. HTN well controlled.    I will follow up in 3 weeks. If significant bradycardia on Holter will refer to EP for pacemaker.

## 2019-11-27 LAB — COMPREHENSIVE METABOLIC PANEL
ALT: 10 IU/L (ref 0–44)
AST: 17 IU/L (ref 0–40)
Albumin/Globulin Ratio: 1.4 (ref 1.2–2.2)
Albumin: 4.3 g/dL (ref 3.6–4.6)
Alkaline Phosphatase: 60 IU/L (ref 48–121)
BUN/Creatinine Ratio: 16 (ref 10–24)
BUN: 31 mg/dL — ABNORMAL HIGH (ref 8–27)
Bilirubin Total: 0.6 mg/dL (ref 0.0–1.2)
CO2: 22 mmol/L (ref 20–29)
Calcium: 9.4 mg/dL (ref 8.6–10.2)
Chloride: 97 mmol/L (ref 96–106)
Creatinine, Ser: 1.93 mg/dL — ABNORMAL HIGH (ref 0.76–1.27)
GFR calc Af Amer: 35 mL/min/{1.73_m2} — ABNORMAL LOW (ref >59)
GFR calc non Af Amer: 31 mL/min/{1.73_m2} — ABNORMAL LOW (ref >59)
Globulin, Total: 3.1 g/dL (ref 1.5–4.5)
Glucose: 130 mg/dL — ABNORMAL HIGH (ref 65–99)
Potassium: 4.2 mmol/L (ref 3.5–5.2)
Sodium: 136 mmol/L (ref 134–144)
Total Protein: 7.4 g/dL (ref 6.0–8.5)

## 2019-11-27 LAB — CBC WITH DIFFERENTIAL/PLATELET
Basophils Absolute: 0.1 10*3/uL (ref 0.0–0.2)
Basos: 1 %
EOS (ABSOLUTE): 0.4 10*3/uL (ref 0.0–0.4)
Eos: 6 %
Hematocrit: 41.3 % (ref 37.5–51.0)
Hemoglobin: 13.7 g/dL (ref 13.0–17.7)
Immature Grans (Abs): 0 10*3/uL (ref 0.0–0.1)
Immature Granulocytes: 0 %
Lymphocytes Absolute: 1.2 10*3/uL (ref 0.7–3.1)
Lymphs: 20 %
MCH: 27.3 pg (ref 26.6–33.0)
MCHC: 33.2 g/dL (ref 31.5–35.7)
MCV: 82 fL (ref 79–97)
Monocytes Absolute: 1.3 10*3/uL — ABNORMAL HIGH (ref 0.1–0.9)
Monocytes: 22 %
Neutrophils Absolute: 3 10*3/uL (ref 1.4–7.0)
Neutrophils: 51 %
Platelets: 116 10*3/uL — ABNORMAL LOW (ref 150–450)
RBC: 5.01 x10E6/uL (ref 4.14–5.80)
RDW: 12.9 % (ref 11.6–15.4)
WBC: 5.8 10*3/uL (ref 3.4–10.8)

## 2019-11-27 LAB — BRAIN NATRIURETIC PEPTIDE: BNP: 328.1 pg/mL — ABNORMAL HIGH (ref 0.0–100.0)

## 2019-11-29 ENCOUNTER — Encounter: Payer: Self-pay | Admitting: Cardiology

## 2019-11-29 ENCOUNTER — Other Ambulatory Visit: Payer: Self-pay

## 2019-11-29 ENCOUNTER — Ambulatory Visit: Payer: Medicare Other | Admitting: Cardiology

## 2019-11-29 VITALS — BP 150/56 | HR 43 | Ht 70.0 in | Wt 201.4 lb

## 2019-11-29 DIAGNOSIS — I5033 Acute on chronic diastolic (congestive) heart failure: Secondary | ICD-10-CM | POA: Diagnosis not present

## 2019-11-29 DIAGNOSIS — I10 Essential (primary) hypertension: Secondary | ICD-10-CM | POA: Diagnosis not present

## 2019-11-29 DIAGNOSIS — I4891 Unspecified atrial fibrillation: Secondary | ICD-10-CM

## 2019-11-29 DIAGNOSIS — R0602 Shortness of breath: Secondary | ICD-10-CM | POA: Diagnosis not present

## 2019-11-29 NOTE — Patient Instructions (Addendum)
Medication Instructions:  Continue same medications *If you need a refill on your cardiac medications before your next appointment, please call your pharmacy*   Lab Work: None ordered    Testing/Procedures: Chest Xray Tuesday 6/15 at [facility]  Schedule 24 hour holter monitor    Follow-Up: At [facility], you and your health needs are our priority.  As part of our continuing mission to provide you with exceptional heart care, we have created designated Provider Care Teams.  These Care Teams include your primary Cardiologist (physician) and Advanced Practice Providers (APPs -  Physician Assistants and Nurse Practitioners) who all work together to provide you with the care you need, when you need it.  We recommend signing up for the patient portal called "MyChart".  Sign up information is provided on this After Visit Summary.  MyChart is used to connect with patients for Virtual Visits (Telemedicine).  Patients are able to view lab/test results, encounter notes, upcoming appointments, etc.  Non-urgent messages can be sent to your provider as well.   To learn more about what you can do with MyChart, go to [email or web address].    Your next appointment:  Monday 01/03/20 at 3:00 pm   The format for your next appointment: Office     Provider: Dr.Jordan

## 2019-11-30 ENCOUNTER — Ambulatory Visit
Admission: RE | Admit: 2019-11-30 | Discharge: 2019-11-30 | Disposition: A | Discharge status: Home / Self Care | Payer: Medicare Other | Source: Ambulatory Visit | Attending: Cardiology | Admitting: Cardiology

## 2019-11-30 DIAGNOSIS — I517 Cardiomegaly: Secondary | ICD-10-CM | POA: Diagnosis not present

## 2019-11-30 DIAGNOSIS — R0602 Shortness of breath: Secondary | ICD-10-CM | POA: Diagnosis not present

## 2019-11-30 DIAGNOSIS — I5033 Acute on chronic diastolic (congestive) heart failure: Secondary | ICD-10-CM

## 2019-11-30 DIAGNOSIS — I4891 Unspecified atrial fibrillation: Secondary | ICD-10-CM

## 2019-11-30 DIAGNOSIS — J90 Pleural effusion, not elsewhere classified: Secondary | ICD-10-CM | POA: Diagnosis not present

## 2019-12-02 DIAGNOSIS — I480 Paroxysmal atrial fibrillation: Secondary | ICD-10-CM | POA: Diagnosis not present

## 2019-12-02 DIAGNOSIS — E1129 Type 2 diabetes mellitus with other diabetic kidney complication: Secondary | ICD-10-CM | POA: Diagnosis not present

## 2019-12-02 DIAGNOSIS — Z7901 Long term (current) use of anticoagulants: Secondary | ICD-10-CM | POA: Diagnosis not present

## 2019-12-03 ENCOUNTER — Ambulatory Visit (INDEPENDENT_AMBULATORY_CARE_PROVIDER_SITE_OTHER): Payer: Medicare Other

## 2019-12-03 DIAGNOSIS — I5023 Acute on chronic systolic (congestive) heart failure: Secondary | ICD-10-CM | POA: Diagnosis not present

## 2019-12-03 DIAGNOSIS — I4891 Unspecified atrial fibrillation: Secondary | ICD-10-CM

## 2019-12-03 DIAGNOSIS — R0602 Shortness of breath: Secondary | ICD-10-CM

## 2019-12-09 DIAGNOSIS — Z8719 Personal history of other diseases of the digestive system: Secondary | ICD-10-CM | POA: Diagnosis not present

## 2019-12-09 DIAGNOSIS — N1832 Chronic kidney disease, stage 3b: Secondary | ICD-10-CM | POA: Diagnosis not present

## 2019-12-09 DIAGNOSIS — G4733 Obstructive sleep apnea (adult) (pediatric): Secondary | ICD-10-CM | POA: Diagnosis not present

## 2019-12-09 DIAGNOSIS — I129 Hypertensive chronic kidney disease with stage 1 through stage 4 chronic kidney disease, or unspecified chronic kidney disease: Secondary | ICD-10-CM | POA: Diagnosis not present

## 2019-12-09 DIAGNOSIS — K529 Noninfective gastroenteritis and colitis, unspecified: Secondary | ICD-10-CM | POA: Diagnosis not present

## 2019-12-10 ENCOUNTER — Encounter: Payer: Self-pay | Admitting: Gastroenterology

## 2019-12-18 ENCOUNTER — Telehealth: Payer: Self-pay | Admitting: Cardiology

## 2019-12-18 DIAGNOSIS — I5033 Acute on chronic diastolic (congestive) heart failure: Secondary | ICD-10-CM | POA: Diagnosis not present

## 2019-12-18 DIAGNOSIS — I4891 Unspecified atrial fibrillation: Secondary | ICD-10-CM | POA: Diagnosis not present

## 2019-12-18 DIAGNOSIS — R0602 Shortness of breath: Secondary | ICD-10-CM | POA: Diagnosis not present

## 2019-12-18 NOTE — Telephone Encounter (Signed)
Zio patch called [place] pt no longer wearing device but he has HR to 34 for 60 seconds in a fib, and 7 pauses the longest of 3 seconds.  Will send to Dr. Martinique.

## 2019-12-21 ENCOUNTER — Other Ambulatory Visit: Payer: Self-pay | Admitting: *Deleted

## 2019-12-21 DIAGNOSIS — I5023 Acute on chronic systolic (congestive) heart failure: Secondary | ICD-10-CM

## 2019-12-21 DIAGNOSIS — I4891 Unspecified atrial fibrillation: Secondary | ICD-10-CM

## 2019-12-21 DIAGNOSIS — R0602 Shortness of breath: Secondary | ICD-10-CM

## 2019-12-30 NOTE — Progress Notes (Signed)
[name] Date of Birth: 1931-10-03 Medical Record #[id number]  History of Present Illness: Mr. Anthis is seen for  followup of Afib. He has a history of atrial fibrillation and underwent cardioversion in 2005. His atrial fibrillation recurred and he was placed on amiodarone which converted him  medically. When seen in November 2014 he had severe bradycardia with HR in the low 40s. Amiodarone was stopped. About 3 weeks later he was in atrial fibrillation with HR 66 bpm. He is on coumadin. He has since been treated with rate control and anticoagulation.  In November 2019  he was scheduled for eye surgery. He was noted to be bradycardic and surgery was cancelled. Seen here and Holter monitor showed Afib with controlled rate. Minimal HR 49. Mean 62. No pause > 2.8 seconds. Echo showed normal LV function with moderate AI and moderate pulmonary HTN. He was cleared for surgery.   He had a Televisit on June 3 and reported increased dyspnea and LE edema since April that did not respond to increased lasix. He was also apparently taking 20 mg of amlodipine. We increased Cardura to 2 mg qhs, lasix to 80 mg bid and dropped amlodipine to 5 mg daily. When seen in follow up he was doing better. Noted slow HR in 40s. Holter done showing average HR 56. Longest pause 3 seconds.   He is seen with his  daughter. He is better. Swelling and abdominal girth are decreased. He has lost an additional 6 lbs.  No dizziness or syncope. Balance is poor. Notes a 2 month history of watery diarrhea multiple times a day. Reports some type of Xray with Dr Philip Aspen.   Current Outpatient Medications on File Prior to Visit  Medication Sig Dispense Refill  . alendronate (FOSAMAX) 70 MG tablet Take 70 mg by mouth every 7 (seven) days. Take with a full glass of water on an empty stomach.     Marland Kitchen amLODipine (NORVASC) 5 MG tablet Take 1 tablet (5 mg total) by mouth daily. 90 tablet 3  . Calcium Carbonate-Vitamin D (CALCIUM + D PO) Take  1,200 mg by mouth.    Marland Kitchen CINNAMON PO Take 1,000 mg by mouth daily.      . Continuous Blood Gluc Sensor (FREESTYLE LIBRE 14 DAY SENSOR) MISC APPLY TO UPPER ARM EVERY 14 DAYS AS DIRECTED    . Cyanocobalamin (VITAMIN B12 PO) Take 1 tablet by mouth daily.    Marland Kitchen doxazosin (CARDURA) 2 MG tablet Take 1 tablet (2 mg total) by mouth at bedtime. 90 tablet 3  . escitalopram (LEXAPRO) 5 MG tablet Take 5 mg by mouth daily.    . ferrous sulfate 325 (65 FE) MG tablet Take 325 mg by mouth daily with breakfast.    . furosemide (LASIX) 80 MG tablet Take 1 tablet (80 mg total) by mouth 2 (two) times daily. (Patient taking differently: Take 80 mg by mouth 2 (two) times daily. Take 1 tablet by mouth 80 mg in the mornings and 40 mg in the PM) 180 tablet 3  . gabapentin (NEURONTIN) 300 MG capsule Take 3 tablets ( 900 mg ) at bedtime    . hydrOXYzine (ATARAX/VISTARIL) 50 MG tablet Take 1 tablet (50 mg total) by mouth at bedtime. 30 tablet 0  . INSULIN ASPART [place] Inject 30 Units into the skin as needed.     Marland Kitchen losartan (COZAAR) 100 MG tablet Take 100 mg by mouth daily.    . NON FORMULARY CPAP at night    .  pantoprazole (PROTONIX) 40 MG tablet Take 1 tablet (40 mg total) by mouth daily. 30 tablet 5  . Potassium Chloride ER 20 MEQ TBCR Take 1 tablet by mouth daily.    . pravastatin (PRAVACHOL) 40 MG tablet Take 40 mg by mouth daily.      Marland Kitchen warfarin (COUMADIN) 3 MG tablet Take 3 mg by mouth daily.      No current facility-administered medications on file prior to visit.    No Known Allergies  Past Medical History:  Diagnosis Date  . Aortic insufficiency   . Atrial fibrillation ([place])   . Barrett esophagus   . Chronic anticoagulation   . Diabetes mellitus   . Dizziness   . Dyspnea   . GERD (gastroesophageal reflux disease)   . Hiatal hernia   . Hyperlipidemia   . Hyperplastic colon polyp 2006  . Hypertension   . Osteopenia   . Sinus bradycardia 05/05/2013  . Sleep apnea     Past Surgical History:  Procedure  Laterality Date  . BLADDER SURGERY    . CARDIOVASCULAR STRESS TEST  04/19/2010   EF 72%  . CARDIOVERSION  2004  . CARPAL TUNNEL RELEASE    . COLON SURGERY    . COLONOSCOPY  08/23/2009   Normal   . EYE SURGERY     MULTIPLE WITH ENUCLEATION ON THE LEFT  . PROSTATE SURGERY    . SHOULDER ARTHROSCOPY     RIGHT SHOULDER  . TRANSTHORACIC ECHOCARDIOGRAM  04/19/2010   EF 55-60%  . TUMOR REMOVAL FROM STOMACH    . US ECHOCARDIOGRAPHY  03/31/2003   EF 60-65%    Social History   Tobacco Use  Smoking Status Current Every Day Smoker  . Types: Cigars  Smokeless Tobacco Never Used    Social History   Substance and Sexual Activity  Alcohol Use No    Family History  Problem Relation Age of Onset  . Heart failure Mother   . Atrial fibrillation Mother   . Heart attack Father        X2  . Heart attack Sister 31    Review of Systems: As noted in history of present illness.   All other systems were reviewed and are negative.  Physical Exam: BP 138/76   Pulse 68   Ht 5\' 10"  (1.778 m)   Wt 195 lb 9.6 oz (88.7 kg)   BMI 28.07 kg/m  GENERAL:  Well appearing WM in NAD HEENT:  PERRL, EOMI, sclera are clear. Oropharynx is clear. NECK:  No jugular venous distention, carotid upstroke brisk and symmetric, no bruits, no thyromegaly or adenopathy LUNGS:  Clear to auscultation bilaterally CHEST:  Unremarkable HEART:  IRRR,  PMI not displaced or sustained,S1 and S2 within normal limits, no S3, no S4: no clicks, no rubs, no murmurs ABD:  Soft, nontender. Mildly distended. BS +, no masses or bruits. No hepatomegaly, no splenomegaly EXT:  2 + pulses throughout, No edema, no cyanosis no clubbing SKIN:  Warm and dry.  No rashes NEURO:  Alert and oriented x 3. Cranial nerves II through XII intact. PSYCH:  Cognitively intact      LABORATORY DATA:  Lab Results  Component Value Date   WBC 5.8 11/26/2019   HGB 13.7 11/26/2019   HCT 41.3 11/26/2019   PLT 116 (L) 11/26/2019   GLUCOSE 130  (H) 11/26/2019   ALT 10 11/26/2019   AST 17 11/26/2019   NA 136 11/26/2019   K 4.2 11/26/2019   CL 97 11/26/2019  CREATININE 1.93 (H) 11/26/2019   BUN 31 (H) 11/26/2019   CO2 22 11/26/2019   TSH 1.079 12/15/2014   INR 2.56 (H) 05/09/2011     Labs reviewed from 10/18/14: cholesterol 128, triglycerides 114, HDL 36, LDL 69. CMET normal. A1c dated 11/17/15: 6.8%.  Dated 06/27/16: cholesterol 149, triglycerides 182, HDL 35, LDL 78. Creatinine 1.5. Other chemistries normal. Hgb 15.1.  Dated 12/25/16: A1c 6.3%. Dated 07/04/17: cholesterol 144, triglycerides 163, HDL 41, LDL 70. TSH normal Dated 02/12/18: A1c 6.2%.  Dated 03/19/18: creatinine 1.3. otherwise chemistries and CBC normal. Dated 10/02/18: cholesterol 134, triglycerides 106, HDL 37, LDL 76. A1c 6.8%. creatinine 1.5. otherwise Chemistries, CBC, TSH normal. Dated 07/06/19: A1c 7.2%. glucose 155.  Dated 09/18/19: cholesterol 106, triglycerides 97, HDL 32, LDL 55. Dated 12/09/19: creatinine 2.2. sodium 132. Otherwise CMET normal.   Ecg today shows Afib with slow rate 43 bpm. Old septal infarct. I have personally reviewed and interpreted this study.    Echo 05/02/18: Study Conclusions  - Left ventricle: The cavity size was normal. There was moderate   concentric hypertrophy. Systolic function was normal. The   estimated ejection fraction was in the range of 60% to 65%. Wall   motion was normal; there were no regional wall motion   abnormalities. - Aortic valve: Transvalvular velocity was within the normal range.   There was no stenosis. There was moderate regurgitation. - Aorta: Ascending aortic diameter: 41 mm (S). - Ascending aorta: The ascending aorta was mildly dilated. - Mitral valve: Calcified annulus. Transvalvular velocity was   within the normal range. There was no evidence for stenosis.   There was trivial regurgitation. - Left atrium: The atrium was severely dilated. - Right ventricle: The cavity size was mildly dilated.  Wall   thickness was normal. Systolic function was normal. - Right atrium: The atrium was severely dilated. - Atrial septum: No defect or patent foramen ovale was identified   by color flow Doppler. - Tricuspid valve: There was mild regurgitation. - Pulmonary arteries: Systolic pressure was moderately increased.   PA peak pressure: 52 mm Hg (S).  Holter monitor 05/12/18:Study Highlights    Atrial fibrillation with controlled ventricular response. Average HR 62. Fastest HR 103. slowest sustained bradycardia 49 bpm. longest pause 2.8 seconds  Rare PVC     Echo 08/26/19: IMPRESSIONS    1. Left ventricular ejection fraction, by estimation, is 60 to 65%. The  left ventricle has normal function. The left ventricle has no regional  wall motion abnormalities. There is moderate concentric left ventricular  hypertrophy. Left ventricular  diastolic parameters are indeterminate.  2. Right ventricular systolic function is normal. The right ventricular  size is normal. There is mildly elevated pulmonary artery systolic  pressure. The estimated right ventricular systolic pressure is 32.3 mmHg.  3. Left atrial size was severely dilated.  4. Right atrial size was severely dilated.  5. The mitral valve is abnormal. Trivial mitral valve regurgitation. No  evidence of mitral stenosis.  6. Tricuspid valve regurgitation is moderate.  7. The aortic valve is normal in structure. Aortic valve regurgitation is  moderate. No aortic stenosis is present.  8. Aortic dilatation noted. There is mild dilatation of the ascending  aorta measuring 40 mm.  9. The inferior vena cava is normal in size with greater than 50%  respiratory variability, suggesting right atrial pressure of 3 mmHg.   Comparison(s): A prior study was performed on 05/01/2018. No significant  change from prior study in LV function or  degree of aortic regurgitation.   Holter 12/21/19 Study Highlights   Atrial fibrillation with slow  ventricular response. range 29-129. average 56 bpm  Longest pause 3.0 seconds :  Assessment / Plan: 1. Atrial fibrillation. Chronic. HR is slow with average of 56 bpm but no indication for pacemaker at this time. On no AV nodal blockade.    Continue anticoagulation on Coumadin.   2. Acute on chronic diastolic CHF. Recent significant edema. Right pleural effusion on CXR. Good response to diuretics. He was on very high amlodipine dose by mistake and this was reduced to 5 mg daily. BNP elevated 329. Repeat CXR showed resolution of pleural effusion. Edema completely resolved. Will reduce lasix to 80 mg daily. Continue to monitor weight. Sodium restriction.   3. Diabetes mellitus- last A1c 7.2%. states he only uses insulin when sugar > 200.   4. Aortic insufficiency. Moderate. Normal LV size. No change on Echo in March.   5. HTN well controlled.   6. Diarrhea. Etiology unclear. I have recommended he stop iron therapy and see if this helps. Last Hgb was normal.    I will follow up in 4 months

## 2020-01-03 ENCOUNTER — Other Ambulatory Visit: Payer: Self-pay

## 2020-01-03 ENCOUNTER — Ambulatory Visit: Payer: Medicare Other | Admitting: Cardiology

## 2020-01-03 ENCOUNTER — Encounter: Payer: Self-pay | Admitting: Cardiology

## 2020-01-03 VITALS — BP 138/76 | HR 68 | Ht 70.0 in | Wt 195.6 lb

## 2020-01-03 DIAGNOSIS — I5033 Acute on chronic diastolic (congestive) heart failure: Secondary | ICD-10-CM

## 2020-01-03 DIAGNOSIS — I482 Chronic atrial fibrillation, unspecified: Secondary | ICD-10-CM | POA: Diagnosis not present

## 2020-01-03 DIAGNOSIS — R0602 Shortness of breath: Secondary | ICD-10-CM

## 2020-01-03 DIAGNOSIS — N183 Chronic kidney disease, stage 3 unspecified: Secondary | ICD-10-CM | POA: Diagnosis not present

## 2020-01-03 NOTE — Patient Instructions (Signed)
Reduce lasix to 80 mg daily  Stop iron therapy  Continue other therapy  Follow up in 4 months

## 2020-01-04 DIAGNOSIS — D6869 Other thrombophilia: Secondary | ICD-10-CM | POA: Diagnosis not present

## 2020-01-04 DIAGNOSIS — E7800 Pure hypercholesterolemia, unspecified: Secondary | ICD-10-CM | POA: Diagnosis not present

## 2020-01-04 DIAGNOSIS — I4821 Permanent atrial fibrillation: Secondary | ICD-10-CM | POA: Diagnosis not present

## 2020-01-04 DIAGNOSIS — E1169 Type 2 diabetes mellitus with other specified complication: Secondary | ICD-10-CM | POA: Diagnosis not present

## 2020-01-19 DIAGNOSIS — I4821 Permanent atrial fibrillation: Secondary | ICD-10-CM | POA: Diagnosis not present

## 2020-02-08 ENCOUNTER — Encounter: Payer: Self-pay | Admitting: Gastroenterology

## 2020-02-08 ENCOUNTER — Other Ambulatory Visit (INDEPENDENT_AMBULATORY_CARE_PROVIDER_SITE_OTHER): Payer: Medicare Other

## 2020-02-08 ENCOUNTER — Ambulatory Visit: Payer: Medicare Other | Admitting: Gastroenterology

## 2020-02-08 VITALS — BP 144/60 | HR 56 | Ht 69.5 in | Wt 199.0 lb

## 2020-02-08 DIAGNOSIS — K2270 Barrett's esophagus without dysplasia: Secondary | ICD-10-CM

## 2020-02-08 DIAGNOSIS — K219 Gastro-esophageal reflux disease without esophagitis: Secondary | ICD-10-CM | POA: Diagnosis not present

## 2020-02-08 DIAGNOSIS — R197 Diarrhea, unspecified: Secondary | ICD-10-CM | POA: Diagnosis not present

## 2020-02-08 DIAGNOSIS — R159 Full incontinence of feces: Secondary | ICD-10-CM

## 2020-02-08 LAB — TSH: TSH: 1.56 u[IU]/mL (ref 0.35–4.50)

## 2020-02-08 LAB — IGA: IgA: 344 mg/dL (ref 68–378)

## 2020-02-08 LAB — C-REACTIVE PROTEIN: CRP: <1 mg/dL (ref 0.5–20.0)

## 2020-02-08 MED ORDER — LOPERAMIDE HCL 2 MG PO TABS: ORAL_TABLET | ORAL | 0 refills | Status: DC

## 2020-02-08 NOTE — Progress Notes (Signed)
HPI :  86 y/o male with history of AF, CHF, DM, history of gastric GIST "many years ago", referred her by Leanna Battles MD for further evaluation of bowel changes and history of Barrett's esophagus.  Patient is accompanied by his daughter today.  He states for the past few months he has had a severe change in his bowel habits.  He passes " pure water" in regards to his stool form, chronic loose stools.  He states when he was taking iron these were dark however he stopped iron and now are brown.  He has about 6 bowel movements or more per day.  No red blood in the stools.  He has some urgency with this and has been wearing diapers and has an occasional accident about once per week.  He does not think eating can make his stools any worse.  He has had very rare nocturnal symptoms.  He denies any antibiotics recently.  He has had issues with CHF and volume status recently, he has had his diuretics adjusted and has lost several pounds of " water weight" while doing this.  Otherwise has been eating well.  Takes Protonix 40 mg a day, denies any GERD, no dysphagia, no nausea or vomiting, no abdominal pain.  He states he is staying well-hydrated. He denies any family history of colon cancer.  He has not really tried taking anything for his bowels yet other than an occasional Imodium sporadically.  On review of his records he last had a colonoscopy in 2011 which was normal.  He also had an EGD done at that time per Dr. Sharlett Iles.  There is report of severe esophagitis with erosions and underlying Barrett's esophagus.  Biopsies showed nondysplastic Barrett's.  He also had Candida esophagitis and gastritis.  He has been on Protonix 40 mg a day since that time and states he does quite well with it.  Previously a surveillance endoscopy was recommended however the patient had he thinks a cardiac issue at the time and never followed up for it.  His last endoscopy was in 2011 as below.     EGD 08/23/09 - Dr. Sharlett Iles  -   ENDOSCOPIC IMPRESSION:     1) Barrett's esophagus     2) Erosions, multiple in the distal esophagus     3) Thrush     4) Mild gastritis in the body and the antrum of the stomach     5) Normal duodenal folds  Colonoscopy 08/23/09 - normal  CT abdomen / pelvis 04/09/17 - IMPRESSION: 1. Negative for intrarenal stone, hydronephrosis or ureteral stone 2. Cyst in the lower pole of the right kidney 3. Enlarged prostate gland with mass effect on the bladder 4. No evidence for obstruction. Large amount of stool within the colon. 5. Small hiatal hernia.   Echo 08/26/19 - EF 60-65%  Past Medical History:  Diagnosis Date  . Aortic insufficiency   . Atrial fibrillation ([place])   . Barrett esophagus   . Chronic anticoagulation   . Diabetes mellitus   . Dizziness   . Dyspnea   . GERD (gastroesophageal reflux disease)   . Hiatal hernia   . Hyperlipidemia   . Hyperplastic colon polyp 2006  . Hypertension   . Osteopenia   . Sinus bradycardia 05/05/2013  . Sleep apnea      Past Surgical History:  Procedure Laterality Date  . BLADDER SURGERY    . CARDIOVASCULAR STRESS TEST  04/19/2010   EF 72%  . CARDIOVERSION  2004  . CARPAL TUNNEL RELEASE    . COLON SURGERY    . COLONOSCOPY  08/23/2009   Normal   . EYE SURGERY     MULTIPLE WITH ENUCLEATION ON THE LEFT  . PROSTATE SURGERY    . SHOULDER ARTHROSCOPY     RIGHT SHOULDER  . TRANSTHORACIC ECHOCARDIOGRAM  04/19/2010   EF 55-60%  . TUMOR REMOVAL FROM STOMACH    . US ECHOCARDIOGRAPHY  03/31/2003   EF 60-65%   Family History  Problem Relation Age of Onset  . Heart failure Mother   . Atrial fibrillation Mother   . Heart attack Father        X2  . Heart attack Sister 108   Social History   Tobacco Use  . Smoking status: Current Every Day Smoker    Types: Cigars  . Smokeless tobacco: Never Used  . Tobacco comment: tobacco info given  Vaping Use  . Vaping Use: Never used  Substance Use Topics  . Alcohol use: Yes     Comment: 2 boubon daily  . Drug use: No   Current Outpatient Medications  Medication Sig Dispense Refill  . amLODipine (NORVASC) 5 MG tablet Take 1 tablet (5 mg total) by mouth daily. 90 tablet 3  . Calcium Carbonate-Vitamin D (CALCIUM + D PO) Take 1,200 mg by mouth.    Marland Kitchen CINNAMON PO Take 1,000 mg by mouth daily.      . Continuous Blood Gluc Sensor (FREESTYLE LIBRE 14 DAY SENSOR) MISC APPLY TO UPPER ARM EVERY 14 DAYS AS DIRECTED    . Cyanocobalamin (VITAMIN B12 PO) Take 1 tablet by mouth daily.    Marland Kitchen doxazosin (CARDURA) 2 MG tablet Take 1 tablet (2 mg total) by mouth at bedtime. 90 tablet 3  . escitalopram (LEXAPRO) 5 MG tablet Take 5 mg by mouth daily.    . furosemide (LASIX) 80 MG tablet Take 80 mg by mouth daily.    Marland Kitchen gabapentin (NEURONTIN) 300 MG capsule Take 3 tablets ( 900 mg ) at bedtime    . hydrOXYzine (ATARAX/VISTARIL) 50 MG tablet Take 1 tablet (50 mg total) by mouth at bedtime. 30 tablet 0  . INSULIN ASPART [place] Inject 30 Units into the skin as needed.     Marland Kitchen losartan (COZAAR) 100 MG tablet Take 100 mg by mouth daily.    . NON FORMULARY CPAP at night    . pantoprazole (PROTONIX) 40 MG tablet Take 1 tablet (40 mg total) by mouth daily. 30 tablet 5  . Potassium Chloride ER 20 MEQ TBCR Take 1 tablet by mouth daily.    . pravastatin (PRAVACHOL) 40 MG tablet Take 40 mg by mouth daily.      Marland Kitchen warfarin (COUMADIN) 3 MG tablet Take 3 mg by mouth. 3mg  Tues, Thur, Sat and Sun  2mg  Mon, Wed , Fri     No current facility-administered medications for this visit.   No Known Allergies   Review of Systems: All systems reviewed and negative except where noted in HPI.   Lab Results  Component Value Date   WBC 5.8 11/26/2019   HGB 13.7 11/26/2019   HCT 41.3 11/26/2019   MCV 82 11/26/2019   PLT 116 (L) 11/26/2019    Lab Results  Component Value Date   CREATININE 1.93 (H) 11/26/2019   BUN 31 (H) 11/26/2019   NA 136 11/26/2019   K 4.2 11/26/2019   CL 97 11/26/2019   CO2 22  11/26/2019    Lab Results  Component Value Date   ALT 10 11/26/2019   AST 17 11/26/2019   ALKPHOS 60 11/26/2019   BILITOT 0.6 11/26/2019     Physical Exam: BP (!) 144/60   Pulse (!) 56   Ht 5' 9.5" (1.765 m)   Wt 199 lb (90.3 kg)   BMI 28.97 kg/m  Constitutional: Pleasant,well-developed, male in no acute distress. HEENT: Normocephalic and atraumatic. No scleral icterus. Neck supple.  Cardiovascular: Normal rate, regular rhythm.  Pulmonary/chest: Effort normal and breath sounds normal. Abdominal: Soft, nondistended, nontender.  There are no masses palpable.  Extremities: no edema Lymphadenopathy: No cervical adenopathy noted. Neurological: Alert and oriented to person place and time. Skin: Skin is warm and dry. No rashes noted. Psychiatric: Normal mood and affect. Behavior is normal.   ASSESSMENT AND PLAN: 86 y/o male here for a new patient visit regarding the following:  Diarrhea / fecal incontinence - as above, persistent loose stools with urgency and occasional fecal incontinence for the past few months. No new medications during this time but has had changes in the dosing of his diuretics as he has dealt with volume issues / LE edema in recent months. Discussed ddx with him. I want to start with some stool studies to rule out inflammatory diarrhea, will also assess for infection but that seems to be less likely. Will also check baseline TSH and add on CRP as well. CBC in June was normal. In the interim discussed options, will try starting with immodium q AM and titrate up as needed and await course of that initially. If he has positive lactoferrin and symptoms persist with negative infectious workup may need to consider colonoscopy or flex sig. Given his age and recent cardiac issues / anticoagulation, patient and daughter want to avoid endoscopic evaluation if possible but pending his course may consider it if symptoms persist and results of stool testing. Will touch base with  them with the results of his labs and go from there, will see how he does with scheduled immodium in the interim. He will call in the interim with questions / concerns.   Barrett's / GERD - history of esophagitis and Barrett's on last EGD 10 years ago.  He has been maintained on chronic Protonix therapy and this controls his symptoms quite well.  He is not followed up for surveillance EGD over the years, given his other medical problems and age the family has not pursued this.  He is asymptomatic in regards to any upper tract symptoms.  I discussed Barrett's with them and potential risk for esophageal cancer.  Given he feels well in that regard they do not want to pursue EGD unless he is sedated for colonoscopy pending his course for bowel issues as above, they may consider EGD in that situation if he is already being sedated.  [place] Cellar, MD [facility]  CC: Leanna Battles, MD

## 2020-02-08 NOTE — Patient Instructions (Addendum)
If you are age 84 or older, your body mass index should be between 23-30. Your Body mass index is 28.97 kg/m. If this is out of the aforementioned range listed, please consider follow up with your Primary Care Provider.  If you are age 38 or younger, your body mass index should be between 19-25. Your Body mass index is 28.97 kg/m. If this is out of the aformentioned range listed, please consider follow up with your Primary Care Provider.   Please go to the lab in the basement of our building to have lab work done as you leave today. Hit "B" for basement when you get on the elevator.  When the doors open the lab is on your left.  We will call you with the results. Thank you.  Due to recent changes in healthcare laws, you may see the results of your imaging and laboratory studies on MyChart before your provider has had a chance to review them.  We understand that in some cases there may be results that are confusing or concerning to you. Not all laboratory results come back in the same time frame and the provider may be waiting for multiple results in order to interpret others.  Please give [place] 48 hours in order for your provider to thoroughly review all the results before contacting the office for clarification of your results.   Take Imodium every morning and then later in the day as needed.  You can increase to 2 tablets in the morning if one tablet is not effective.  Thank you for entrusting me with your care and for choosing [facility], Dr. Henning Cellar

## 2020-02-09 ENCOUNTER — Ambulatory Visit: Payer: Medicare Other | Admitting: Cardiology

## 2020-02-09 LAB — TISSUE TRANSGLUTAMINASE, IGA: (tTG) Ab, IgA: 1 U/mL

## 2020-02-10 LAB — FECAL LACTOFERRIN, QUANT
Fecal Lactoferrin: POSITIVE — AB
MICRO NUMBER:: 10865003
SPECIMEN QUALITY:: ADEQUATE

## 2020-02-11 ENCOUNTER — Telehealth: Payer: Self-pay | Admitting: *Deleted

## 2020-02-11 LAB — GI PROFILE, STOOL, PCR

## 2020-02-11 NOTE — Telephone Encounter (Signed)
Patient with diagnosis of ATRIAL FIBRILLATION on WARFARIN for anticoagulation.    Procedure: ENDOSCOPY/COLONOSCOPY Date of procedure: TBD  CHADS2-VASc score of  5 (CHF, HTN, AGE X 2, DM2)  CrCl = 35 ML/MIN Platelet count = 116  Per office protocol, patient can hold WRAFARIN for 5 days prior to procedure.  Patient WILL NOT need bridging with Lovenox (enoxaparin) around procedure.  * This patient is NOT follow by our coumadin clinic. Please update patient's anticoagulation clinic*

## 2020-02-11 NOTE — Telephone Encounter (Signed)
   Primary Cardiologist: Dr. [place]  Patient states that he has not decided to have colonoscopy or EDG.  He will call GI to let them know if he decides otherwise.  Dr. Felipa Eth manages his coumadin.   GI will need to send back a Pre-op request if procedure is planned   Please call with questions.  Phill Myron. [place] Pugh, ANP, AACC  02/11/2020, 12:03 PM

## 2020-02-11 NOTE — Telephone Encounter (Signed)
Request for surgical clearance:     Endoscopy Procedure  What type of surgery is being performed?     Endoscopy/Colonoscopy  When is this surgery scheduled?     TBD  What type of clearance is required ?   Pharmacy  Are there any medications that need to be held prior to surgery and how long? Coumadin, 5 days  Practice name and name of physician performing surgery?      [facility]  What is your office phone and fax number?      Phone- [phone]  [phone]  Anesthesia type (None, local, MAC, general) ?       MAC

## 2020-02-16 DIAGNOSIS — I4821 Permanent atrial fibrillation: Secondary | ICD-10-CM | POA: Diagnosis not present

## 2020-03-07 DIAGNOSIS — D6869 Other thrombophilia: Secondary | ICD-10-CM | POA: Diagnosis not present

## 2020-03-07 DIAGNOSIS — H25811 Combined forms of age-related cataract, right eye: Secondary | ICD-10-CM | POA: Diagnosis not present

## 2020-03-07 DIAGNOSIS — Z970 Presence of artificial eye: Secondary | ICD-10-CM | POA: Diagnosis not present

## 2020-03-07 DIAGNOSIS — E1169 Type 2 diabetes mellitus with other specified complication: Secondary | ICD-10-CM | POA: Diagnosis not present

## 2020-03-07 DIAGNOSIS — H35371 Puckering of macula, right eye: Secondary | ICD-10-CM | POA: Diagnosis not present

## 2020-03-07 DIAGNOSIS — H35031 Hypertensive retinopathy, right eye: Secondary | ICD-10-CM | POA: Diagnosis not present

## 2020-03-07 DIAGNOSIS — I4821 Permanent atrial fibrillation: Secondary | ICD-10-CM | POA: Diagnosis not present

## 2020-03-07 DIAGNOSIS — E7800 Pure hypercholesterolemia, unspecified: Secondary | ICD-10-CM | POA: Diagnosis not present

## 2020-03-16 ENCOUNTER — Other Ambulatory Visit (HOSPITAL_COMMUNITY): Payer: Self-pay | Admitting: Ophthalmology

## 2020-03-16 DIAGNOSIS — H35371 Puckering of macula, right eye: Secondary | ICD-10-CM | POA: Diagnosis not present

## 2020-03-16 DIAGNOSIS — H25811 Combined forms of age-related cataract, right eye: Secondary | ICD-10-CM | POA: Diagnosis not present

## 2020-03-16 DIAGNOSIS — H43811 Vitreous degeneration, right eye: Secondary | ICD-10-CM | POA: Diagnosis not present

## 2020-03-17 DIAGNOSIS — I4821 Permanent atrial fibrillation: Secondary | ICD-10-CM | POA: Diagnosis not present

## 2020-03-21 NOTE — Progress Notes (Signed)
PATIENT: Anthony Ho DOB: [birth year]  REASON FOR VISIT: follow up HISTORY FROM: patient  HISTORY OF PRESENT ILLNESS: Today 03/21/20 :  Anthony Ho is an 86 year old male with a history of obstructive sleep apnea on CPAP.  He returns today for follow-up.  His download indicates that he uses machine 29 out of 30 days for compliance of 97%.  He uses machine greater than 4 hours each night.  On average he uses his machine 11 hours and 5 minutes.  His residual AHI is 2.9 on 5 to 15 cm of water with EPR of 2.  Leak in the 95th percentile is 30.2 L/min.  He states that his cardiologist mentioned that he needs supplemental O2.  We advised that we are unable to prescribe supplemental O2 without doing a titration study.  This order will need to come from cardiology unless he wants to do a titration study.  Patient voiced understanding.  Patient also mentions that he has been more unsteady with his gait and has had a few falls.  He is not discussed this with his PCP recently.  HISTORY  09/20/19:  Anthony Ho is an 86 year old male with a history of obstructive sleep apnea on CPAP.  He returns today for follow-up.  His download indicates that he uses machine nightly for compliance of 100%.  He uses machine greater than 4 hours each night.  On average he uses his machine 10 hours and 22 minutes.  Residual AHI is 6.8 on 5 to 10 cm of water with EPR of 2.  Leak in the 95th percentile is 37.8 L/min.  He reports that he prefers his nasal mask.  He currently has a fullface mask.  REVIEW OF SYSTEMS: Out of a complete 14 system review of symptoms, the patient complains only of the following symptoms, and all other reviewed systems are negative.   ESS 11  ALLERGIES: No Known Allergies  HOME MEDICATIONS: Outpatient Medications Prior to Visit  Medication Sig Dispense Refill  . amLODipine (NORVASC) 5 MG tablet Take 1 tablet (5 mg total) by mouth daily. 90 tablet 3  . Calcium Carbonate-Vitamin D (CALCIUM +  D PO) Take 1,200 mg by mouth.    Marland Kitchen CINNAMON PO Take 1,000 mg by mouth daily.      . Continuous Blood Gluc Sensor (FREESTYLE LIBRE 14 DAY SENSOR) MISC APPLY TO UPPER ARM EVERY 14 DAYS AS DIRECTED    . Cyanocobalamin (VITAMIN B12 PO) Take 1 tablet by mouth daily.    Marland Kitchen doxazosin (CARDURA) 2 MG tablet Take 1 tablet (2 mg total) by mouth at bedtime. 90 tablet 3  . escitalopram (LEXAPRO) 5 MG tablet Take 5 mg by mouth daily.    . furosemide (LASIX) 80 MG tablet Take 80 mg by mouth daily.    Marland Kitchen gabapentin (NEURONTIN) 300 MG capsule Take 3 tablets ( 900 mg ) at bedtime    . hydrOXYzine (ATARAX/VISTARIL) 50 MG tablet Take 1 tablet (50 mg total) by mouth at bedtime. 30 tablet 0  . INSULIN ASPART [place] Inject 30 Units into the skin as needed.     . loperamide (IMODIUM A-D) 2 MG tablet Take every morning. Take again later in the day as needed 30 tablet 0  . losartan (COZAAR) 100 MG tablet Take 100 mg by mouth daily.    . NON FORMULARY CPAP at night    . pantoprazole (PROTONIX) 40 MG tablet Take 1 tablet (40 mg total) by mouth daily. 30 tablet 5  .  Potassium Chloride ER 20 MEQ TBCR Take 1 tablet by mouth daily.    . pravastatin (PRAVACHOL) 40 MG tablet Take 40 mg by mouth daily.      Marland Kitchen warfarin (COUMADIN) 3 MG tablet Take 3 mg by mouth. 3mg  Tues, Thur, Sat and Sun  2mg  Mon, Wed , Fri     No facility-administered medications prior to visit.    PAST MEDICAL HISTORY: Past Medical History:  Diagnosis Date  . Aortic insufficiency   . Atrial fibrillation ([place])   . Barrett esophagus   . Chronic anticoagulation   . Diabetes mellitus   . Dizziness   . Dyspnea   . GERD (gastroesophageal reflux disease)   . Hiatal hernia   . Hyperlipidemia   . Hyperplastic colon polyp 2006  . Hypertension   . Osteopenia   . Sinus bradycardia 05/05/2013  . Sleep apnea     PAST SURGICAL HISTORY: Past Surgical History:  Procedure Laterality Date  . BLADDER SURGERY    . CARDIOVASCULAR STRESS TEST  04/19/2010   EF 72%   . CARDIOVERSION  2004  . CARPAL TUNNEL RELEASE    . COLON SURGERY    . COLONOSCOPY  08/23/2009   Normal   . EYE SURGERY     MULTIPLE WITH ENUCLEATION ON THE LEFT  . PROSTATE SURGERY    . SHOULDER ARTHROSCOPY     RIGHT SHOULDER  . TRANSTHORACIC ECHOCARDIOGRAM  04/19/2010   EF 55-60%  . TUMOR REMOVAL FROM STOMACH    . US ECHOCARDIOGRAPHY  03/31/2003   EF 60-65%    FAMILY HISTORY: Family History  Problem Relation Age of Onset  . Heart failure Mother   . Atrial fibrillation Mother   . Heart attack Father        X2  . Heart attack Sister 57    SOCIAL HISTORY: Social History   Socioeconomic History  . Marital status: Married    Spouse name: Not on file  . Number of children: 4  . Years of education: Not on file  . Highest education level: Not on file  Occupational History  . Occupation: Retired    Fish farm manager: RETIRED  Tobacco Use  . Smoking status: Current Every Day Smoker    Types: Cigars  . Smokeless tobacco: Never Used  . Tobacco comment: tobacco info given  Vaping Use  . Vaping Use: Never used  Substance and Sexual Activity  . Alcohol use: Yes    Comment: 2 boubon daily  . Drug use: No  . Sexual activity: Not on file  Other Topics Concern  . Not on file  Social History Narrative  . Not on file   Social Determinants of Health   Financial Resource Strain:   . Difficulty of Paying Living Expenses: Not on file  Food Insecurity:   . Worried About Charity fundraiser in the Last Year: Not on file  . Ran Out of Food in the Last Year: Not on file  Transportation Needs:   . Lack of Transportation (Medical): Not on file  . Lack of Transportation (Non-Medical): Not on file  Physical Activity:   . Days of Exercise per Week: Not on file  . Minutes of Exercise per Session: Not on file  Stress:   . Feeling of Stress : Not on file  Social Connections:   . Frequency of Communication with Friends and Family: Not on file  . Frequency of Social Gatherings with  Friends and Family: Not on file  . Attends Religious  Services: Not on file  . Active Member of Clubs or Organizations: Not on file  . Attends Archivist Meetings: Not on file  . Marital Status: Not on file  Intimate Partner Violence:   . Fear of Current or Ex-Partner: Not on file  . Emotionally Abused: Not on file  . Physically Abused: Not on file  . Sexually Abused: Not on file      PHYSICAL EXAM  Vitals:   03/22/20 0902  BP: 118/70  Pulse: 60  Weight: 201 lb 9.6 oz (91.4 kg)  Height: 5' 9.5" (1.765 m)   Body mass index is 29.34 kg/m.  Generalized: Well developed, in no acute distress  Chest: Lungs clear to auscultation bilaterally  Neurological examination  Mentation: Alert oriented to time, place, history taking. Follows all commands speech and language fluent Cranial nerve II-XII: Extraocular movements were full, visual field were full on confrontational test Head turning and shoulder shrug  were normal and symmetric. Motor: The motor testing reveals 5 over 5 strength of all 4 extremities. Good symmetric motor tone is noted throughout.  Sensory: Sensory testing is intact to soft touch on all 4 extremities. No evidence of extinction is noted.  Gait and station: Patient uses a cane when ambulating.  Needs assistance when standing.  Gait is slightly unsteady.   DIAGNOSTIC DATA (LABS, IMAGING, TESTING) - I reviewed patient records, labs, notes, testing and imaging myself where available.  Lab Results  Component Value Date   WBC 5.8 11/26/2019   HGB 13.7 11/26/2019   HCT 41.3 11/26/2019   MCV 82 11/26/2019   PLT 116 (L) 11/26/2019      Component Value Date/Time   NA 136 11/26/2019 1036   K 4.2 11/26/2019 1036   CL 97 11/26/2019 1036   CO2 22 11/26/2019 1036   GLUCOSE 130 (H) 11/26/2019 1036   GLUCOSE 179 (H) 05/01/2012 1153   BUN 31 (H) 11/26/2019 1036   CREATININE 1.93 (H) 11/26/2019 1036   CALCIUM 9.4 11/26/2019 1036   PROT 7.4 11/26/2019 1036    ALBUMIN 4.3 11/26/2019 1036   AST 17 11/26/2019 1036   ALT 10 11/26/2019 1036   ALKPHOS 60 11/26/2019 1036   BILITOT 0.6 11/26/2019 1036   GFRNONAA 31 (L) 11/26/2019 1036   GFRAA 35 (L) 11/26/2019 1036    Lab Results  Component Value Date   VITAMINB12 360 05/01/2012   Lab Results  Component Value Date   TSH 1.56 02/08/2020      ASSESSMENT AND PLAN 86 y.o. year old male  has a past medical history of Aortic insufficiency, Atrial fibrillation ([place]), Barrett esophagus, Chronic anticoagulation, Diabetes mellitus, Dizziness, Dyspnea, GERD (gastroesophageal reflux disease), Hiatal hernia, Hyperlipidemia, Hyperplastic colon polyp (2006), Hypertension, Osteopenia, Sinus bradycardia (05/05/2013), and Sleep apnea. here with:  1. OSA on CPAP  - CPAP compliance excellent - Good treatment of AHI  - Encourage patient to use CPAP nightly and > 4 hours each night -Discussed with cardiology regarding supplemental O2 -Patient was advised to discuss concerns about his gait and balance with his PCP first.  If they feel a neurology consult is needed we will get him scheduled with Dr. Brett Fairy - F/U in 1 year or sooner if needed   I spent 25 minutes of face-to-face and non-face-to-face time with patient.  This included previsit chart review, lab review, study review, order entry, electronic health record documentation, patient education.  Ward Givens, MSN, NP-C 03/21/2020, 4:18 PM [facility] [address]  [place], [place] [address] [phone]

## 2020-03-22 ENCOUNTER — Ambulatory Visit: Payer: Medicare Other | Admitting: Adult Health

## 2020-03-22 ENCOUNTER — Encounter: Payer: Self-pay | Admitting: Adult Health

## 2020-03-22 ENCOUNTER — Other Ambulatory Visit: Payer: Self-pay

## 2020-03-22 VITALS — BP 118/70 | HR 60 | Ht 69.5 in | Wt 201.6 lb

## 2020-03-22 DIAGNOSIS — G4733 Obstructive sleep apnea (adult) (pediatric): Secondary | ICD-10-CM | POA: Diagnosis not present

## 2020-03-22 DIAGNOSIS — Z9989 Dependence on other enabling machines and devices: Secondary | ICD-10-CM

## 2020-03-22 NOTE — Patient Instructions (Signed)
Continue using CPAP nightly and greater than 4 hours each night °If your symptoms worsen or you develop new symptoms please let us know.  ° °

## 2020-03-24 DIAGNOSIS — I4821 Permanent atrial fibrillation: Secondary | ICD-10-CM | POA: Diagnosis not present

## 2020-03-24 DIAGNOSIS — R269 Unspecified abnormalities of gait and mobility: Secondary | ICD-10-CM | POA: Diagnosis not present

## 2020-03-24 DIAGNOSIS — D6869 Other thrombophilia: Secondary | ICD-10-CM | POA: Diagnosis not present

## 2020-03-24 DIAGNOSIS — M25551 Pain in right hip: Secondary | ICD-10-CM | POA: Diagnosis not present

## 2020-03-27 MED FILL — PROLENSA 0.07% EYE DROPS: 0.07 | 30 days supply | Qty: 3 | Fill #0

## 2020-03-27 MED FILL — MOXIFLOXACIN HCL 0.5 % SOLN: 0.5 | 15 days supply | Qty: 3 | Fill #0

## 2020-04-04 DIAGNOSIS — H25811 Combined forms of age-related cataract, right eye: Secondary | ICD-10-CM | POA: Diagnosis not present

## 2020-04-04 DIAGNOSIS — H52201 Unspecified astigmatism, right eye: Secondary | ICD-10-CM | POA: Diagnosis not present

## 2020-04-04 DIAGNOSIS — H268 Other specified cataract: Secondary | ICD-10-CM | POA: Diagnosis not present

## 2020-04-14 ENCOUNTER — Other Ambulatory Visit: Payer: Self-pay | Admitting: Internal Medicine

## 2020-04-14 ENCOUNTER — Ambulatory Visit
Admission: RE | Admit: 2020-04-14 | Discharge: 2020-04-14 | Disposition: A | Discharge status: Home / Self Care | Payer: Medicare Other | Source: Ambulatory Visit | Attending: Internal Medicine | Admitting: Internal Medicine

## 2020-04-14 DIAGNOSIS — I878 Other specified disorders of veins: Secondary | ICD-10-CM | POA: Diagnosis not present

## 2020-04-14 DIAGNOSIS — R109 Unspecified abdominal pain: Secondary | ICD-10-CM

## 2020-04-14 DIAGNOSIS — Z9889 Other specified postprocedural states: Secondary | ICD-10-CM | POA: Diagnosis not present

## 2020-04-14 DIAGNOSIS — I4821 Permanent atrial fibrillation: Secondary | ICD-10-CM | POA: Diagnosis not present

## 2020-04-14 DIAGNOSIS — M545 Low back pain, unspecified: Secondary | ICD-10-CM | POA: Diagnosis not present

## 2020-04-17 ENCOUNTER — Ambulatory Visit: Payer: Medicare Other

## 2020-04-24 DIAGNOSIS — R7989 Other specified abnormal findings of blood chemistry: Secondary | ICD-10-CM | POA: Diagnosis not present

## 2020-04-24 DIAGNOSIS — I4821 Permanent atrial fibrillation: Secondary | ICD-10-CM | POA: Diagnosis not present

## 2020-05-05 DIAGNOSIS — I4821 Permanent atrial fibrillation: Secondary | ICD-10-CM | POA: Diagnosis not present

## 2020-05-06 NOTE — Progress Notes (Signed)
[name] Date of Birth: 11/12/1931 Medical Record #[id number]  History of Present Illness: Mr. Kaser is seen for  followup of Afib. He has a history of atrial fibrillation and underwent cardioversion in 2005. His atrial fibrillation recurred and he was placed on amiodarone which converted him  medically. When seen in November 2014 he had severe bradycardia with HR in the low 40s. Amiodarone was stopped. About 3 weeks later he was in atrial fibrillation with HR 66 bpm. He is on coumadin. He has since been treated with rate control and anticoagulation.  In November 2019  he was scheduled for eye surgery. He was noted to be bradycardic and surgery was cancelled. Seen here and Holter monitor showed Afib with controlled rate. Minimal HR 49. Mean 62. No pause > 2.8 seconds. Echo showed normal LV function with moderate AI and moderate pulmonary HTN. He was cleared for surgery.   He had a Televisit on June 3 and reported increased dyspnea and LE edema since April that did not respond to increased lasix. He was also apparently taking 20 mg of amlodipine. We increased Cardura to 2 mg qhs, lasix to 80 mg bid and dropped amlodipine to 5 mg daily. When seen in follow up he was doing better. Noted slow HR in 40s. Holter done showing average HR 56. Longest pause 3 seconds.   He is seen with his  daughter. He reports he has persistent DOE but no edema. Weight is stable. No cough. Unsteady in gait and has fallen a couple of times. Still has loose stools despite stopping iron. To see GI. Last INR 3.5.   Current Outpatient Medications on File Prior to Visit  Medication Sig Dispense Refill  . amLODipine (NORVASC) 5 MG tablet Take 1 tablet (5 mg total) by mouth daily. 90 tablet 3  . Calcium Carbonate-Vitamin D (CALCIUM + D PO) Take 1,200 mg by mouth.    Marland Kitchen CINNAMON PO Take 1,000 mg by mouth daily.      . Continuous Blood Gluc Sensor (FREESTYLE LIBRE 14 DAY SENSOR) MISC APPLY TO UPPER ARM EVERY 14 DAYS AS DIRECTED     . Cyanocobalamin (VITAMIN B12 PO) Take 1 tablet by mouth daily.    Marland Kitchen doxazosin (CARDURA) 2 MG tablet Take 1 tablet (2 mg total) by mouth at bedtime. 90 tablet 3  . escitalopram (LEXAPRO) 5 MG tablet Take 5 mg by mouth daily.    . furosemide (LASIX) 80 MG tablet Take 80 mg by mouth daily.    Marland Kitchen gabapentin (NEURONTIN) 300 MG capsule Take 3 tablets ( 900 mg ) at bedtime    . hydrOXYzine (ATARAX/VISTARIL) 50 MG tablet Take 1 tablet (50 mg total) by mouth at bedtime. 30 tablet 0  . losartan (COZAAR) 100 MG tablet Take 100 mg by mouth daily.    . NON FORMULARY CPAP at night    . pantoprazole (PROTONIX) 40 MG tablet Take 1 tablet (40 mg total) by mouth daily. 30 tablet 5  . Potassium Chloride ER 20 MEQ TBCR Take 1 tablet by mouth daily.    . pravastatin (PRAVACHOL) 40 MG tablet Take 40 mg by mouth daily.      Marland Kitchen warfarin (COUMADIN) 3 MG tablet Take 3 mg by mouth. 3mg  Tues, Thur, Sat and Sun  2mg  Mon, Wed , Fri     No current facility-administered medications on file prior to visit.    No Known Allergies  Past Medical History:  Diagnosis Date  . Aortic insufficiency   .  Atrial fibrillation ([place])   . Barrett esophagus   . Chronic anticoagulation   . Diabetes mellitus   . Dizziness   . Dyspnea   . GERD (gastroesophageal reflux disease)   . Hiatal hernia   . Hyperlipidemia   . Hyperplastic colon polyp 2006  . Hypertension   . Osteopenia   . Sinus bradycardia 05/05/2013  . Sleep apnea     Past Surgical History:  Procedure Laterality Date  . BLADDER SURGERY    . CARDIOVASCULAR STRESS TEST  04/19/2010   EF 72%  . CARDIOVERSION  2004  . CARPAL TUNNEL RELEASE    . COLON SURGERY    . COLONOSCOPY  08/23/2009   Normal   . EYE SURGERY     MULTIPLE WITH ENUCLEATION ON THE LEFT  . PROSTATE SURGERY    . SHOULDER ARTHROSCOPY     RIGHT SHOULDER  . TRANSTHORACIC ECHOCARDIOGRAM  04/19/2010   EF 55-60%  . TUMOR REMOVAL FROM STOMACH    . US ECHOCARDIOGRAPHY  03/31/2003   EF 60-65%     Social History   Tobacco Use  Smoking Status Current Every Day Smoker  . Types: Cigars  Smokeless Tobacco Never Used  Tobacco Comment   tobacco info given    Social History   Substance and Sexual Activity  Alcohol Use Yes   Comment: 2 boubon daily    Family History  Problem Relation Age of Onset  . Heart failure Mother   . Atrial fibrillation Mother   . Heart attack Father        X2  . Heart attack Sister 47    Review of Systems: As noted in history of present illness.   All other systems were reviewed and are negative.  Physical Exam: BP (!) 150/73   Pulse 68   Temp (!) 96.8 F (36 C)   Ht 5\' 10"  (1.778 m)   Wt 204 lb 9.6 oz (92.8 kg)   SpO2 97%   BMI 29.36 kg/m  GENERAL:  Well appearing WM in NAD HEENT:  PERRL, EOMI, sclera are clear. Oropharynx is clear. NECK:  No jugular venous distention, carotid upstroke brisk and symmetric, no bruits, no thyromegaly or adenopathy LUNGS:  Clear to auscultation bilaterally CHEST:  Unremarkable HEART:  IRRR,  PMI not displaced or sustained,S1 and S2 within normal limits, no S3, no S4: no clicks, no rubs, no murmurs ABD:  Soft, nontender. Mildly distended. BS +, no masses or bruits. No hepatomegaly, no splenomegaly EXT:  2 + pulses throughout, No edema, no cyanosis no clubbing SKIN:  Warm and dry.  No rashes NEURO:  Alert and oriented x 3. Cranial nerves II through XII intact. PSYCH:  Cognitively intact      LABORATORY DATA:  Lab Results  Component Value Date   WBC 5.8 11/26/2019   HGB 13.7 11/26/2019   HCT 41.3 11/26/2019   PLT 116 (L) 11/26/2019   GLUCOSE 130 (H) 11/26/2019   ALT 10 11/26/2019   AST 17 11/26/2019   NA 136 11/26/2019   K 4.2 11/26/2019   CL 97 11/26/2019   CREATININE 1.93 (H) 11/26/2019   BUN 31 (H) 11/26/2019   CO2 22 11/26/2019   TSH 1.56 02/08/2020   INR 2.56 (H) 05/09/2011     Labs reviewed from 10/18/14: cholesterol 128, triglycerides 114, HDL 36, LDL 69. CMET normal. A1c dated  11/17/15: 6.8%.  Dated 06/27/16: cholesterol 149, triglycerides 182, HDL 35, LDL 78. Creatinine 1.5. Other chemistries normal. Hgb 15.1.  Dated  12/25/16: A1c 6.3%. Dated 07/04/17: cholesterol 144, triglycerides 163, HDL 41, LDL 70. TSH normal Dated 02/12/18: A1c 6.2%.  Dated 03/19/18: creatinine 1.3. otherwise chemistries and CBC normal. Dated 10/02/18: cholesterol 134, triglycerides 106, HDL 37, LDL 76. A1c 6.8%. creatinine 1.5. otherwise Chemistries, CBC, TSH normal. Dated 07/06/19: A1c 7.2%. glucose 155.  Dated 09/18/19: cholesterol 106, triglycerides 97, HDL 32, LDL 55. Dated 12/09/19: creatinine 2.2. sodium 132. Otherwise CMET normal.  Dated 03/07/20: A1c 7.3%. cholesterol 150, triglycerides 160, HDL 45, LDL 55. LFTs normal.  Dated 04/24/20: creatinine 1.8. BUN 36. Hgb and electrolytes normal.   Ecg not done today    Echo 05/02/18: Study Conclusions  - Left ventricle: The cavity size was normal. There was moderate   concentric hypertrophy. Systolic function was normal. The   estimated ejection fraction was in the range of 60% to 65%. Wall   motion was normal; there were no regional wall motion   abnormalities. - Aortic valve: Transvalvular velocity was within the normal range.   There was no stenosis. There was moderate regurgitation. - Aorta: Ascending aortic diameter: 41 mm (S). - Ascending aorta: The ascending aorta was mildly dilated. - Mitral valve: Calcified annulus. Transvalvular velocity was   within the normal range. There was no evidence for stenosis.   There was trivial regurgitation. - Left atrium: The atrium was severely dilated. - Right ventricle: The cavity size was mildly dilated. Wall   thickness was normal. Systolic function was normal. - Right atrium: The atrium was severely dilated. - Atrial septum: No defect or patent foramen ovale was identified   by color flow Doppler. - Tricuspid valve: There was mild regurgitation. - Pulmonary arteries: Systolic pressure was  moderately increased.   PA peak pressure: 52 mm Hg (S).  Holter monitor 05/12/18:Study Highlights    Atrial fibrillation with controlled ventricular response. Average HR 62. Fastest HR 103. slowest sustained bradycardia 49 bpm. longest pause 2.8 seconds  Rare PVC     Echo 08/26/19: IMPRESSIONS    1. Left ventricular ejection fraction, by estimation, is 60 to 65%. The  left ventricle has normal function. The left ventricle has no regional  wall motion abnormalities. There is moderate concentric left ventricular  hypertrophy. Left ventricular  diastolic parameters are indeterminate.  2. Right ventricular systolic function is normal. The right ventricular  size is normal. There is mildly elevated pulmonary artery systolic  pressure. The estimated right ventricular systolic pressure is 48.5 mmHg.  3. Left atrial size was severely dilated.  4. Right atrial size was severely dilated.  5. The mitral valve is abnormal. Trivial mitral valve regurgitation. No  evidence of mitral stenosis.  6. Tricuspid valve regurgitation is moderate.  7. The aortic valve is normal in structure. Aortic valve regurgitation is  moderate. No aortic stenosis is present.  8. Aortic dilatation noted. There is mild dilatation of the ascending  aorta measuring 40 mm.  9. The inferior vena cava is normal in size with greater than 50%  respiratory variability, suggesting right atrial pressure of 3 mmHg.   Comparison(s): A prior study was performed on 05/01/2018. No significant  change from prior study in LV function or degree of aortic regurgitation.   Holter 12/21/19 Study Highlights   Atrial fibrillation with slow ventricular response. range 29-129. average 56 bpm  Longest pause 3.0 seconds :  Assessment / Plan: 1. Atrial fibrillation. Chronic. HR is slow with average of 56 bpm but no indication for pacemaker at this time. On no AV nodal blockade.  Continue anticoagulation on Coumadin.   2.  Chronic diastolic CHF. Appears euvolemic on lasix 80 mg daily.   Continue to monitor weight. Sodium restriction.   3. Diabetes mellitus- last A1c 7.2%. states he only uses insulin when sugar > 200.   4. Aortic insufficiency. Moderate. Normal LV size. No change on Echo in March.   5. HTN mildly elevated today  but generally under control  6. Diarrhea. Etiology unclear. To see GI.   I will follow up in 6 months

## 2020-05-08 ENCOUNTER — Ambulatory Visit: Payer: Medicare Other | Admitting: Cardiology

## 2020-05-08 ENCOUNTER — Encounter: Payer: Self-pay | Admitting: Cardiology

## 2020-05-08 ENCOUNTER — Other Ambulatory Visit: Payer: Self-pay

## 2020-05-08 VITALS — BP 150/73 | HR 68 | Temp 96.8°F | Ht 70.0 in | Wt 204.6 lb

## 2020-05-08 DIAGNOSIS — I5032 Chronic diastolic (congestive) heart failure: Secondary | ICD-10-CM

## 2020-05-08 DIAGNOSIS — I10 Essential (primary) hypertension: Secondary | ICD-10-CM

## 2020-05-08 DIAGNOSIS — I482 Chronic atrial fibrillation, unspecified: Secondary | ICD-10-CM

## 2020-05-19 DIAGNOSIS — I4821 Permanent atrial fibrillation: Secondary | ICD-10-CM | POA: Diagnosis not present

## 2020-05-31 DIAGNOSIS — N401 Enlarged prostate with lower urinary tract symptoms: Secondary | ICD-10-CM | POA: Diagnosis not present

## 2020-05-31 DIAGNOSIS — R310 Gross hematuria: Secondary | ICD-10-CM | POA: Diagnosis not present

## 2020-05-31 DIAGNOSIS — R351 Nocturia: Secondary | ICD-10-CM | POA: Diagnosis not present

## 2020-06-01 DIAGNOSIS — L821 Other seborrheic keratosis: Secondary | ICD-10-CM | POA: Diagnosis not present

## 2020-06-01 DIAGNOSIS — L723 Sebaceous cyst: Secondary | ICD-10-CM | POA: Diagnosis not present

## 2020-06-01 DIAGNOSIS — D1801 Hemangioma of skin and subcutaneous tissue: Secondary | ICD-10-CM | POA: Diagnosis not present

## 2020-06-01 DIAGNOSIS — Z85828 Personal history of other malignant neoplasm of skin: Secondary | ICD-10-CM | POA: Diagnosis not present

## 2020-06-02 DIAGNOSIS — I4821 Permanent atrial fibrillation: Secondary | ICD-10-CM | POA: Diagnosis not present

## 2020-06-22 ENCOUNTER — Other Ambulatory Visit (INDEPENDENT_AMBULATORY_CARE_PROVIDER_SITE_OTHER): Payer: Medicare Other

## 2020-06-22 ENCOUNTER — Ambulatory Visit: Payer: Medicare Other | Admitting: Gastroenterology

## 2020-06-22 ENCOUNTER — Encounter: Payer: Self-pay | Admitting: Gastroenterology

## 2020-06-22 VITALS — BP 142/68 | HR 67 | Wt 207.0 lb

## 2020-06-22 DIAGNOSIS — K219 Gastro-esophageal reflux disease without esophagitis: Secondary | ICD-10-CM

## 2020-06-22 DIAGNOSIS — K529 Noninfective gastroenteritis and colitis, unspecified: Secondary | ICD-10-CM

## 2020-06-22 DIAGNOSIS — K2270 Barrett's esophagus without dysplasia: Secondary | ICD-10-CM

## 2020-06-22 DIAGNOSIS — R159 Full incontinence of feces: Secondary | ICD-10-CM | POA: Diagnosis not present

## 2020-06-22 LAB — CBC WITH DIFFERENTIAL/PLATELET
Basophils Absolute: 0.1 10*3/uL (ref 0.0–0.1)
Basophils Relative: 1.3 % (ref 0.0–3.0)
Eosinophils Absolute: 0.3 10*3/uL (ref 0.0–0.7)
Eosinophils Relative: 4.7 % (ref 0.0–5.0)
HCT: 38.6 % — ABNORMAL LOW (ref 39.0–52.0)
Hemoglobin: 13.4 g/dL (ref 13.0–17.0)
Lymphocytes Relative: 19.2 % (ref 12.0–46.0)
Lymphs Abs: 1.4 10*3/uL (ref 0.7–4.0)
MCHC: 34.7 g/dL (ref 30.0–36.0)
MCV: 84.2 fl (ref 78.0–100.0)
Monocytes Absolute: 1.5 10*3/uL — ABNORMAL HIGH (ref 0.1–1.0)
Monocytes Relative: 20.3 % — ABNORMAL HIGH (ref 3.0–12.0)
Neutro Abs: 3.9 10*3/uL (ref 1.4–7.7)
Neutrophils Relative %: 54.5 % (ref 43.0–77.0)
Platelets: 139 10*3/uL — ABNORMAL LOW (ref 150.0–400.0)
RBC: 4.58 Mil/uL (ref 4.22–5.81)
RDW: 13.7 % (ref 11.5–15.5)
WBC: 7.2 10*3/uL (ref 4.0–10.5)

## 2020-06-22 MED ORDER — LOPERAMIDE HCL 2 MG PO TABS: ORAL_TABLET | ORAL | 0 refills | Status: DC

## 2020-06-22 MED ORDER — COLESTIPOL HCL 1 G PO TABS: 1.0000 g | ORAL_TABLET | Freq: Two times a day (BID) | ORAL | 3 refills | Status: DC

## 2020-06-22 NOTE — Patient Instructions (Addendum)
If you are age 85 or older, your body mass index should be between 23-30. Your Body mass index is 29.7 kg/m. If this is out of the aforementioned range listed, please consider follow up with your Primary Care Provider.  If you are age 15 or younger, your body mass index should be between 19-25. Your Body mass index is 29.7 kg/m. If this is out of the aformentioned range listed, please consider follow up with your Primary Care Provider.   Please go to the lab in the basement of our building to have lab work done as you leave today. Hit "B" for basement when you get on the elevator.  When the doors open the lab is on your left.  We will call you with the results. Thank you.   Due to recent changes in healthcare laws, you may see the results of your imaging and laboratory studies on MyChart before your provider has had a chance to review them.  We understand that in some cases there may be results that are confusing or concerning to you. Not all laboratory results come back in the same time frame and the provider may be waiting for multiple results in order to interpret others.  Please give [place] 48 hours in order for your provider to thoroughly review all the results before contacting the office for clarification of your results.   Please purchase the following medications over the counter and take as directed: Imodium: Take once daily  We have sent the following medications to your pharmacy for you to pick up at your convenience: Colestid 1 g: Take twice daily. Can increase to 2 tablets twice a day as needed  Please message [place] in a few weeks and give [place] an update on how you are doing.  Thank you for entrusting me with your care and for choosing [facility], Dr. Ileene Patrick

## 2020-06-22 NOTE — Progress Notes (Signed)
HPI :  87 y/o male with history of AF on coumadin, CHF, DM, history of gastric GIST "many years ago", history of Barrett's esophagus, chronic diarrhea, here for a follow up visit for chronic diarrhea.  I last saw him in August for bowel habit changes.  He has been having persistent loose watery stools now for the past several months.  He endorsed a history of having about 6 bowel movements a day with urgency.  He has had episodes of incontinence due to the urgency, now wearing pads to deal with this.  He denies any recent medications or changes in his medications since these symptoms have started.  He does take chronic Protonix 40 mg a day for history of reflux and severe esophagitis with Barrett's noted on last endoscopy with Dr. Jarold Motto in 2011.  We obtained some initial labs in August which showed that CRP was undetectable, TSH normal, celiac serologies negative.  GI pathogen panel was negative, fecal lactoferrin was positive.  I discussed options with him at that time and offered colonoscopy but they declined and wanted to try medical therapy first.  He states he has used some Imodium as we previously had recommended, although unclear exactly how much he was taking and how frequently.  He states in general when he takes the Imodium it has not really helped too much.  His symptoms appear to be relatively unchanged since of last seen him and this continues to be an issue for him.  He denies any blood in the stools.  No abdominal pains.  No weight loss.  Seems to be maintaining hydration okay.  He states he thinks he tolerates his Protonix well and it works to control his reflux symptoms.  He denies any dysphagia, or nausea or vomiting.  He has chronic atrial fibrillation but states he tolerates it okay.  He is on Coumadin for this, denies history of TIA or stroke.  His gallbladder is in place.  He has had an echocardiogram earlier in March which showed an EF of 60 to 65%.  He is accompanied by his  daughter today and we discussed options on how to manage this moving forward.  They inquire about probiotics.  Of note his daughter has collagenous colitis and is being treated for that. He denies any routine NSAID use.  Prior workup:  EGD 08/23/09 - Dr. Jarold Motto - ENDOSCOPIC IMPRESSION: 1) Barrett's esophagus 2) Erosions, multiple in the distal esophagus 3) Thrush 4) Mild gastritis in the body and the antrum of the stomach 5) Normal duodenal folds  Colonoscopy 08/23/09 - normal  CT abdomen / pelvis 04/09/17 - IMPRESSION: 1. Negative for intrarenal stone, hydronephrosis or ureteral stone 2. Cyst in the lower pole of the right kidney 3. Enlarged prostate gland with mass effect on the bladder 4. No evidence for obstruction. Large amount of stool within the colon. 5. Small hiatal hernia.   Echo 08/26/19 - EF 60-65%  02/08/20 - celiac labs negative, CRP < 1.0, TSH 1.56, lactoferrin (+), GI pathogen panel    Past Medical History:  Diagnosis Date  . Aortic insufficiency   . Atrial fibrillation (HCC)   . Barrett esophagus   . Chronic anticoagulation   . Diabetes mellitus   . Dizziness   . Dyspnea   . GERD (gastroesophageal reflux disease)   . Hiatal hernia   . Hyperlipidemia   . Hyperplastic colon polyp 2006  . Hypertension   . Osteopenia   . Sinus bradycardia 05/05/2013  . Sleep apnea  Past Surgical History:  Procedure Laterality Date  . BLADDER SURGERY    . CARDIOVASCULAR STRESS TEST  04/19/2010   EF 72%  . CARDIOVERSION  2004  . CARPAL TUNNEL RELEASE    . COLON SURGERY    . COLONOSCOPY  08/23/2009   Normal   . EYE SURGERY     MULTIPLE WITH ENUCLEATION ON THE LEFT  . PROSTATE SURGERY    . SHOULDER ARTHROSCOPY     RIGHT SHOULDER  . TRANSTHORACIC ECHOCARDIOGRAM  04/19/2010   EF 55-60%  . TUMOR REMOVAL FROM STOMACH    . US ECHOCARDIOGRAPHY  03/31/2003   EF 60-65%   Family History  Problem Relation Age of Onset  . Heart failure  Mother   . Atrial fibrillation Mother   . Heart attack Father        X2  . Heart attack Sister 90   Social History   Tobacco Use  . Smoking status: Current Every Day Smoker    Types: Cigars  . Smokeless tobacco: Never Used  . Tobacco comment: tobacco info given  Vaping Use  . Vaping Use: Never used  Substance Use Topics  . Alcohol use: Yes    Comment: 2 boubon daily  . Drug use: No   Current Outpatient Medications  Medication Sig Dispense Refill  . amLODipine (NORVASC) 5 MG tablet Take 1 tablet (5 mg total) by mouth daily. 90 tablet 3  . Calcium Carbonate-Vitamin D (CALCIUM + D PO) Take 1,200 mg by mouth.    Marland Kitchen CINNAMON PO Take 1,000 mg by mouth daily.    . Continuous Blood Gluc Sensor (FREESTYLE LIBRE 14 DAY SENSOR) MISC APPLY TO UPPER ARM EVERY 14 DAYS AS DIRECTED    . Cyanocobalamin (VITAMIN B12 PO) Take 1 tablet by mouth daily.    Marland Kitchen doxazosin (CARDURA) 2 MG tablet Take 1 tablet (2 mg total) by mouth at bedtime. 90 tablet 3  . furosemide (LASIX) 80 MG tablet Take 80 mg by mouth daily.    Marland Kitchen gabapentin (NEURONTIN) 300 MG capsule Take 3 tablets ( 900 mg ) at bedtime    . hydrOXYzine (ATARAX/VISTARIL) 50 MG tablet Take 1 tablet (50 mg total) by mouth at bedtime. 30 tablet 0  . losartan (COZAAR) 100 MG tablet Take 100 mg by mouth daily.    . NON FORMULARY CPAP at night    . pantoprazole (PROTONIX) 40 MG tablet Take 1 tablet (40 mg total) by mouth daily. 30 tablet 5  . Potassium Chloride ER 20 MEQ TBCR Take 1 tablet by mouth daily.    . pravastatin (PRAVACHOL) 40 MG tablet Take 40 mg by mouth daily.    Marland Kitchen warfarin (COUMADIN) 3 MG tablet Take 3 mg by mouth. 3mg  Harrell Lark, Sat and Sun  2mg  Mon, Wed , Fri     No current facility-administered medications for this visit.   No Known Allergies   Review of Systems: All systems reviewed and negative except where noted in HPI.   Lab Results  Component Value Date   WBC 5.8 11/26/2019   HGB 13.7 11/26/2019   HCT 41.3 11/26/2019    MCV 82 11/26/2019   PLT 116 (L) 11/26/2019    Lab Results  Component Value Date   CREATININE 1.93 (H) 11/26/2019   BUN 31 (H) 11/26/2019   NA 136 11/26/2019   K 4.2 11/26/2019   CL 97 11/26/2019   CO2 22 11/26/2019    Lab Results  Component Value Date   ALT 10  11/26/2019   AST 17 11/26/2019   ALKPHOS 60 11/26/2019   BILITOT 0.6 11/26/2019      Physical Exam: BP (!) 142/68   Pulse 67   Wt 207 lb (93.9 kg)   BMI 29.70 kg/m  Constitutional: Pleasant,well-developed, male in no acute distress. Abdominal: Soft, nondistended, nontender.  There are no masses palpable.  Extremities: no edema Lymphadenopathy: No cervical adenopathy noted. Neurological: Alert and oriented to person place and time. Skin: Skin is warm and dry. No rashes noted. Psychiatric: Normal mood and affect. Behavior is normal.   ASSESSMENT AND PLAN: 87 year old male here for reassessment of the following:  Chronic diarrhea / fecal incontinence - ongoing symptoms essentially unchanged since I last saw him in August.  His work-up is as above, negative for infection, positive fecal lactoferrin however.  Celiac testing negative, TSH normal.  We discussed differential diagnosis and options for further work-up depending on how aggressive they want to be with this evaluation.  Ultimately colonoscopy would be most useful to exclude overt colitis/IBD and microscopic colitis.  We discussed what this would entail, with the bowel prep, and risks of anesthesia.  I would also recommend we hold his Coumadin for few days if he were to consider this to remove polyps if seen.  We discussed these options and they want to hold off on colonoscopy if possible right now and try other modalities of treatment first.  We discussed options, they understand risks of not pursuing colonoscopy is not diagnosing colitis / malignancy, etc.  I will send to the lab for CBC to ensure no anemia.  I will also check pancreatic fecal elastase to ensure  okay, and repeat fecal lactoferrin to see if this is persistently positive.  After discussion of options I will treat him empirically with Colestid 1 g twice daily to start and can increase to 2 g twice daily if needed.  He can also take Imodium routinely, 2 in the morning and additional doses as needed (unclear how much he has actually taken of this in the past).  I offered them a trial of Lomotil however did an discussion of risks they would prefer to avoid that for now.  We will await his course for the next 3 weeks or so and see how he does on this regimen.  If his bowel symptoms remain unchanged and he has persistent symptoms despite the new regimen, and his fecal lactoferrin is persistently positive, I think we should really consider colonoscopy or flex sig if they are agreeable to it, if he can tolerate the prep.  They agree with the plan, asked them to reach out to me and let me know how he is doing in a few weeks so we can make a decision on how to proceed moving forward based on his course and lab results.  They agreed  Barrett's esophagus / GERD - on chronic PPI for this, at risk for microscopic colitis.  This is controlling his reflux symptoms fairly well.  They did not want to pursue routine surveillance EGD of his Barrett's given his age however if we do end up proceeding with colonoscopy in the next few weeks, we may consider doing EGD at the same time he is under anesthesia to survey this.  We will await his course as above.  Oriskany Cellar, MD [facility]

## 2020-06-28 LAB — FECAL LACTOFERRIN, QUANT
Fecal Lactoferrin: NEGATIVE
MICRO NUMBER:: 11389900
SPECIMEN QUALITY:: ADEQUATE

## 2020-06-28 LAB — PANCREATIC ELASTASE, FECAL: Pancreatic Elastase-1, Stool: >500 mcg/g

## 2020-06-30 DIAGNOSIS — I4821 Permanent atrial fibrillation: Secondary | ICD-10-CM | POA: Diagnosis not present

## 2020-07-06 DIAGNOSIS — E1169 Type 2 diabetes mellitus with other specified complication: Secondary | ICD-10-CM | POA: Diagnosis not present

## 2020-07-06 DIAGNOSIS — E7800 Pure hypercholesterolemia, unspecified: Secondary | ICD-10-CM | POA: Diagnosis not present

## 2020-07-06 DIAGNOSIS — D6869 Other thrombophilia: Secondary | ICD-10-CM | POA: Diagnosis not present

## 2020-07-06 DIAGNOSIS — I4821 Permanent atrial fibrillation: Secondary | ICD-10-CM | POA: Diagnosis not present

## 2020-07-13 DIAGNOSIS — Z4420 Encounter for fitting and adjustment of artificial eye, unspecified: Secondary | ICD-10-CM | POA: Diagnosis not present

## 2020-07-28 ENCOUNTER — Other Ambulatory Visit: Payer: Self-pay | Admitting: Cardiology

## 2020-07-28 DIAGNOSIS — I4821 Permanent atrial fibrillation: Secondary | ICD-10-CM | POA: Diagnosis not present

## 2020-07-28 DIAGNOSIS — D696 Thrombocytopenia, unspecified: Secondary | ICD-10-CM | POA: Diagnosis not present

## 2020-08-02 DIAGNOSIS — Q111 Other anophthalmos: Secondary | ICD-10-CM | POA: Diagnosis not present

## 2020-08-11 DIAGNOSIS — D696 Thrombocytopenia, unspecified: Secondary | ICD-10-CM | POA: Diagnosis not present

## 2020-09-07 DIAGNOSIS — I4821 Permanent atrial fibrillation: Secondary | ICD-10-CM | POA: Diagnosis not present

## 2020-09-07 DIAGNOSIS — D696 Thrombocytopenia, unspecified: Secondary | ICD-10-CM | POA: Diagnosis not present

## 2020-09-14 DIAGNOSIS — Z7901 Long term (current) use of anticoagulants: Secondary | ICD-10-CM | POA: Diagnosis not present

## 2020-09-28 DIAGNOSIS — Z7901 Long term (current) use of anticoagulants: Secondary | ICD-10-CM | POA: Diagnosis not present

## 2020-10-03 ENCOUNTER — Other Ambulatory Visit: Payer: Self-pay | Admitting: Geriatric Medicine

## 2020-10-03 ENCOUNTER — Ambulatory Visit
Admission: RE | Admit: 2020-10-03 | Discharge: 2020-10-03 | Disposition: A | Discharge status: Home / Self Care | Payer: Medicare Other | Source: Ambulatory Visit | Attending: Geriatric Medicine | Admitting: Geriatric Medicine

## 2020-10-03 DIAGNOSIS — M545 Low back pain, unspecified: Secondary | ICD-10-CM

## 2020-10-03 DIAGNOSIS — M47815 Spondylosis without myelopathy or radiculopathy, thoracolumbar region: Secondary | ICD-10-CM | POA: Diagnosis not present

## 2020-10-03 DIAGNOSIS — R269 Unspecified abnormalities of gait and mobility: Secondary | ICD-10-CM | POA: Diagnosis not present

## 2020-10-03 DIAGNOSIS — D6869 Other thrombophilia: Secondary | ICD-10-CM | POA: Diagnosis not present

## 2020-10-03 DIAGNOSIS — I4821 Permanent atrial fibrillation: Secondary | ICD-10-CM | POA: Diagnosis not present

## 2020-10-06 DIAGNOSIS — H35371 Puckering of macula, right eye: Secondary | ICD-10-CM | POA: Diagnosis not present

## 2020-10-06 DIAGNOSIS — Z961 Presence of intraocular lens: Secondary | ICD-10-CM | POA: Diagnosis not present

## 2020-10-06 DIAGNOSIS — H43811 Vitreous degeneration, right eye: Secondary | ICD-10-CM | POA: Diagnosis not present

## 2020-10-06 DIAGNOSIS — E119 Type 2 diabetes mellitus without complications: Secondary | ICD-10-CM | POA: Diagnosis not present

## 2020-10-20 DIAGNOSIS — R310 Gross hematuria: Secondary | ICD-10-CM | POA: Diagnosis not present

## 2020-10-23 DIAGNOSIS — D414 Neoplasm of uncertain behavior of bladder: Secondary | ICD-10-CM | POA: Diagnosis not present

## 2020-10-23 DIAGNOSIS — R310 Gross hematuria: Secondary | ICD-10-CM | POA: Diagnosis not present

## 2020-10-30 ENCOUNTER — Telehealth: Payer: Self-pay | Admitting: Cardiology

## 2020-10-30 MED ORDER — APIXABAN 2.5 MG PO TABS: 2.5000 mg | ORAL_TABLET | Freq: Two times a day (BID) | ORAL | 3 refills | Status: DC

## 2020-10-30 NOTE — Telephone Encounter (Signed)
Spoke to patient he stated 10/20/20 he had blood in urine.Stated Dr.Dahlstedt stopped Coumadin.He has not taken in 10 days.Stated he is not having any blood in urine.He wanted to ask Dr.Jordan if he could take Eliquis or Xarelto instead of Coumadin.Dr.Jordan advised to take Eliquis 2.5 mg twice a day.Sample left at front desk.Prescription sent to pharmacy.

## 2020-10-30 NOTE — Telephone Encounter (Signed)
Pt c/o medication issue:  1. Name of Medication: warfarin (COUMADIN) 3 MG tablet  2. How are you currently taking this medication (dosage and times per day)? Not currently taking since 10/20/20  3. Are you having a reaction (difficulty breathing--STAT)? No   4. What is your medication issue? Michai is calling requesting to speak with Malachy Mood in regards to his coumadin. He states he hasn't taken it since 10/20/20 and has an appt with his neurologist tomorrow where he will be having a test performed. He states he may need surgery and is concerned by this because he knows he will have to hold his coumadin to do so. He is fearful he has been off of his coumadin too long which he feels could resort to him having a stroke. Please advise.

## 2020-10-30 NOTE — Telephone Encounter (Signed)
Called patient left message on personal voice mail to call me back. 

## 2020-10-31 DIAGNOSIS — N281 Cyst of kidney, acquired: Secondary | ICD-10-CM | POA: Diagnosis not present

## 2020-10-31 DIAGNOSIS — K449 Diaphragmatic hernia without obstruction or gangrene: Secondary | ICD-10-CM | POA: Diagnosis not present

## 2020-10-31 DIAGNOSIS — R310 Gross hematuria: Secondary | ICD-10-CM | POA: Diagnosis not present

## 2020-10-31 DIAGNOSIS — K429 Umbilical hernia without obstruction or gangrene: Secondary | ICD-10-CM | POA: Diagnosis not present

## 2020-11-27 ENCOUNTER — Ambulatory Visit: Payer: Medicare Other | Admitting: Cardiology

## 2020-11-28 ENCOUNTER — Telehealth: Payer: Self-pay | Admitting: Cardiology

## 2020-11-28 ENCOUNTER — Other Ambulatory Visit: Payer: Self-pay | Admitting: Urology

## 2020-11-28 NOTE — Telephone Encounter (Signed)
Pt called back and said he saw the number come up on his phone. I informed the pt that he needed an appt for pre op clearance. I explained though that I had no available appts at the [facility] office where he see's Dr. Martinique I could offer him an appt at [facility] [address] . Pt is agreeable to plan of care for his pre op appt. Appt has been made to see Cecilie Kicks, NP 12/27/20 @ 9:15 at the [facility] location. Pt read back appt date, time, address and provider. I will send note to NP for upcoming appt. I will send FYI to surgeon's office pt has appt 12/27/20. I did ask pt if her ever received a reminder letter to make an appt with Dr. Martinique. Pt said no he did not receive any reminder letter. I assured the pt that I will send a message to [facility], Dr. Doug Sou nurse that it looks like he is due to see Dr. Martinique and have her reach out to him with an appt with Dr. Martinique. Pt thanked me for the help today.

## 2020-11-28 NOTE — Telephone Encounter (Signed)
I tried to reach out to the pt to advise he is going to need a pre op appt for clearance. Looks like pt is supposed to f/u with Dr. Martinique as well. I did find any appts at the [facility] office available before the pt's upcoming surgery date of 01/08/21. I called pt but no answer. I was going to offer at least for his pre op clearance appt an appt here at [facility] with Cecilie Kicks, NP on 7/13 either at 9:15 or 2:15.

## 2020-11-28 NOTE — Telephone Encounter (Signed)
Patient with diagnosis of atrial fibrillation on Eliquis for anticoagulation.    Procedure: TURBT Date of procedure: 01/08/21   CHA2DS2-VASc Score = 4  This indicates a 4.8% annual risk of stroke. The patient's score is based upon: CHF History: No HTN History: Yes Diabetes History: Yes Stroke History: No Vascular Disease History: No Age Score: 2 Gender Score: 0   CrCl 49.4 Platelet count 139  Per office protocol, patient can hold Eliquis for 2 days prior to procedure.   Patient will not need bridging with Lovenox (enoxaparin) around procedure.

## 2020-11-28 NOTE — Telephone Encounter (Signed)
       [facility] Pre-operative Risk Assessment    Patient Name: Anthony Ho  DOB: [birth year]  MRN: [id number]   HEARTCARE STAFF: - Please ensure there is not already an duplicate clearance open for this procedure. - Under Visit Info/Reason for Call, type in Other and utilize the format Clearance MM/DD/YY or Clearance TBD. Do not use dashes or single digits. - If request is for dental extraction, please clarify the # of teeth to be extracted. - If the patient is currently at the dentist's office, call Pre-Op APP to address. If the patient is not currently in the dentist office, please route to the Pre-Op pool  Request for surgical clearance:  What type of surgery is being performed? TURBT  When is this surgery scheduled? 01/08/21  What type of clearance is required (medical clearance vs. Pharmacy clearance to hold med vs. Both)? Both  Are there any medications that need to be held prior to surgery and how long? Held Eliquis 48 hours prior procedure  Practice name and name of physician performing surgery? Dr. Franchot Gallo - [facility]   What is the office phone number? [phone] ext [phone]   7.   What is the office fax number? [phone]  8.   Anesthesia type (None, local, MAC, general) ? General   Angeline S Hammer 11/28/2020, 11:00 AM  _________________________________________________________________   (provider comments below)

## 2020-11-28 NOTE — Telephone Encounter (Signed)
   Name: Anthony Ho  DOB: [birth year]  MRN: [id number]  Primary Cardiologist: Dr. Martinique   Chart reviewed as part of pre-operative protocol coverage. Because of Isidro Monks Giampietro's past medical history with bradycardia and time since last visit, he will require a follow-up visit in order to better assess preoperative cardiovascular risk.  Pt was to have a recall placed for 6 month follow up with Dr. Martinique around 10/2020.  Pre-op covering staff: - Please schedule appointment and call patient to inform them.  If applicable, this message will also be routed to pharmacy pool and/or primary cardiologist for input on holding anticoagulant/antiplatelet agent as requested below so that this information is available to the clearing provider at time of patient's appointment.   Kathyrn Drown, NP  11/28/2020, 2:47 PM

## 2020-12-11 DIAGNOSIS — I129 Hypertensive chronic kidney disease with stage 1 through stage 4 chronic kidney disease, or unspecified chronic kidney disease: Secondary | ICD-10-CM | POA: Diagnosis not present

## 2020-12-11 DIAGNOSIS — N1832 Chronic kidney disease, stage 3b: Secondary | ICD-10-CM | POA: Diagnosis not present

## 2020-12-11 DIAGNOSIS — I4821 Permanent atrial fibrillation: Secondary | ICD-10-CM | POA: Diagnosis not present

## 2020-12-11 DIAGNOSIS — D6869 Other thrombophilia: Secondary | ICD-10-CM | POA: Diagnosis not present

## 2020-12-26 NOTE — Progress Notes (Addendum)
Cardiology Office Note   Date:  12/27/2020   ID:  Anthony Ho, DOB 1932-06-10, MRN [id number]  PCP:  Lajean Manes, MD  Cardiologist:  Dr. Martinique    Chief Complaint  Patient presents with   Pre-op Exam   Pre-op eval    History of Present Illness: Anthony Ho is a 87 y.o. male who presents for pre-op eval. Pre- op eval for TURBT  01/08/21 Dr. Diona Fanti.   Pt can hold Eliquis 2 days prior to procedure.  (Has been on coumadin but changed to Eliquis 10/30/20 due to hematuria.)  Pt has hx of a fib, with DCCV 2005, with recurrence placed on amiodarone and converted medically,   in 2014 HR in 40s so amiodarone stopped with recurrence of a fib in 3 weeks.  So only treated with rate control and is on eliquis.   Continues with episodic Zufall  has been unsteady in Nov 2021 with last visit. And persistent DOE. Has not had indication for PPM.    Today pt feels well. He has had this lesion removed in his bladder several times, though the last time has been several years ago.  No chest pain, his SOB is stable.  He cannot climb more than 10 steps and has to stop mainly due to leg and hip pain.  He does have SOB as well but has been this way.   He does walk on the treadmill and use a step machine for 45 min MWF and lifts weights.  No angina with this.    No more bleeding on eliquis.   Past Medical History:  Diagnosis Date   Aortic insufficiency    Atrial fibrillation (HCC)    Barrett esophagus    Chronic anticoagulation    Diabetes mellitus    Dizziness    Dyspnea    GERD (gastroesophageal reflux disease)    Hiatal hernia    Hyperlipidemia    Hyperplastic colon polyp 2006   Hypertension    Osteopenia    Sinus bradycardia 05/05/2013   Sleep apnea     Past Surgical History:  Procedure Laterality Date   BLADDER SURGERY     CARDIOVASCULAR STRESS TEST  04/19/2010   EF 72%   CARDIOVERSION  2004   CARPAL TUNNEL RELEASE     COLON SURGERY     COLONOSCOPY  08/23/2009   Normal     EYE SURGERY     MULTIPLE WITH ENUCLEATION ON THE LEFT   PROSTATE SURGERY     SHOULDER ARTHROSCOPY     RIGHT SHOULDER   TRANSTHORACIC ECHOCARDIOGRAM  04/19/2010   EF 55-60%   TUMOR REMOVAL FROM STOMACH     US ECHOCARDIOGRAPHY  03/31/2003   EF 60-65%     Current Outpatient Medications  Medication Sig Dispense Refill   amLODipine (NORVASC) 10 MG tablet Take 10 mg by mouth daily.     apixaban (ELIQUIS) 2.5 MG TABS tablet Take 1 tablet (2.5 mg total) by mouth 2 (two) times daily. 180 tablet 3   Calcium Carbonate-Vitamin D (CALCIUM + D PO) Take 1,200 mg by mouth.     CINNAMON PO Take 1,000 mg by mouth daily.     Continuous Blood Gluc Sensor (FREESTYLE LIBRE 14 DAY SENSOR) MISC APPLY TO UPPER ARM EVERY 14 DAYS AS DIRECTED     Cyanocobalamin (VITAMIN B12 PO) Take 1 tablet by mouth daily.     doxazosin (CARDURA) 2 MG tablet TAKE 1 TABLET BY MOUTH AT BEDTIME 90 tablet 3  escitalopram (LEXAPRO) 5 MG tablet Take 5 mg by mouth daily.     furosemide (LASIX) 80 MG tablet Take 80 mg by mouth daily.     gabapentin (NEURONTIN) 300 MG capsule Take 3 tablets ( 900 mg ) at bedtime     hydrOXYzine (ATARAX/VISTARIL) 50 MG tablet Take 1 tablet (50 mg total) by mouth at bedtime. 30 tablet 0   losartan (COZAAR) 100 MG tablet Take 100 mg by mouth daily.     NON FORMULARY CPAP at night     pantoprazole (PROTONIX) 40 MG tablet Take 1 tablet (40 mg total) by mouth daily. 30 tablet 5   Potassium Chloride ER 20 MEQ TBCR Take 1 tablet by mouth daily.     pravastatin (PRAVACHOL) 40 MG tablet Take 40 mg by mouth daily.     Semaglutide (RYBELSUS) 7 MG TABS Take 7 mg by mouth daily.     No current facility-administered medications for this visit.    Allergies:   Patient has no allergy information on record.    Social History:  The patient  reports that he has been smoking cigars. He has never used smokeless tobacco. He reports current alcohol use. He reports that he does not use drugs.   Family History:  The  patient's family history includes Atrial fibrillation in his mother; Heart attack in his father; Heart attack (age of onset: 37) in his sister; Heart failure in his mother.    ROS:  General:no colds or fevers, no weight changes Skin:no rashes or ulcers HEENT:no blurred vision, no congestion CV:see HPI PUL:see HPI GI:no diarrhea constipation or melena, no indigestion GU:no hematuria now on eliquis, no dysuria MS:no joint pain, no claudication Neuro:no syncope, no lightheadedness Endo:+ diabetes, no thyroid disease  Wt Readings from Last 3 Encounters:  12/27/20 212 lb (96.2 kg)  06/22/20 207 lb (93.9 kg)  05/08/20 204 lb 9.6 oz (92.8 kg)     PHYSICAL EXAM: VS:  BP 138/62   Pulse (!) 59   Ht 5\' 10"  (1.778 m)   Wt 212 lb (96.2 kg)   SpO2 98%   BMI 30.42 kg/m  , BMI Body mass index is 30.42 kg/m. General:Pleasant affect, NAD Skin:Warm and dry, brisk capillary refill HEENT:normocephalic, sclera clear, mucus membranes moist Neck:supple, no JVD, no bruits  Heart:S1S2 RRR without murmur, gallup, rub or click Lungs:clear without rales, rhonchi, or wheezes WPY:KDXI, non tender, + BS, do not palpate liver spleen or masses Ext:no lower ext edema, 2+ pedal pulses, 2+ radial pulses Neuro:alert and oriented, MAE, follows commands, + facial symmetry    EKG:  EKG is ordered today. The ekg ordered today demonstrates atrial fib rate 59 slower at times. No acute changes.    Recent Labs: 02/08/2020: TSH 1.56 06/22/2020: Hemoglobin 13.4; Platelets 139.0    Lipid Panel No results found for: CHOL, TRIG, HDL, CHOLHDL, VLDL, LDLCALC, LDLDIRECT     Other studies Reviewed: Additional studies/ records that were reviewed today include: . Echo 05/02/18: Study Conclusions   - Left ventricle: The cavity size was normal. There was moderate   concentric hypertrophy. Systolic function was normal. The   estimated ejection fraction was in the range of 60% to 65%. Wall   motion was normal; there  were no regional wall motion   abnormalities. - Aortic valve: Transvalvular velocity was within the normal range.   There was no stenosis. There was moderate regurgitation. - Aorta: Ascending aortic diameter: 41 mm (S). - Ascending aorta: The ascending aorta was mildly  dilated. - Mitral valve: Calcified annulus. Transvalvular velocity was   within the normal range. There was no evidence for stenosis.   There was trivial regurgitation. - Left atrium: The atrium was severely dilated. - Right ventricle: The cavity size was mildly dilated. Wall   thickness was normal. Systolic function was normal. - Right atrium: The atrium was severely dilated. - Atrial septum: No defect or patent foramen ovale was identified   by color flow Doppler. - Tricuspid valve: There was mild regurgitation. - Pulmonary arteries: Systolic pressure was moderately increased.   PA peak pressure: 52 mm Hg (S).   Holter monitor 05/12/18:Study Highlights   Atrial fibrillation with controlled ventricular response. Average HR 62. Fastest HR 103. slowest sustained bradycardia 49 bpm. longest pause 2.8 seconds Rare PVC      Echo 08/26/19: IMPRESSIONS     1. Left ventricular ejection fraction, by estimation, is 60 to 65%. The  left ventricle has normal function. The left ventricle has no regional  wall motion abnormalities. There is moderate concentric left ventricular  hypertrophy. Left ventricular  diastolic parameters are indeterminate.   2. Right ventricular systolic function is normal. The right ventricular  size is normal. There is mildly elevated pulmonary artery systolic  pressure. The estimated right ventricular systolic pressure is 62.3 mmHg.   3. Left atrial size was severely dilated.   4. Right atrial size was severely dilated.   5. The mitral valve is abnormal. Trivial mitral valve regurgitation. No  evidence of mitral stenosis.   6. Tricuspid valve regurgitation is moderate.   7. The aortic valve is  normal in structure. Aortic valve regurgitation is  moderate. No aortic stenosis is present.   8. Aortic dilatation noted. There is mild dilatation of the ascending  aorta measuring 40 mm.   9. The inferior vena cava is normal in size with greater than 50%  respiratory variability, suggesting right atrial pressure of 3 mmHg.   Comparison(s): A prior study was performed on 05/01/2018. No significant  change from prior study in LV function or degree of aortic regurgitation.   Holter 12/21/19 Study Highlights   Atrial fibrillation with slow ventricular response. range 29-129. average 56 bpm Longest pause 3.0 sec.    ASSESSMENT AND PLAN:  1.  Pre- op eval for TURBT he has had this procedure in distant past. No further hematuria on eliquis.  Pt is mild to moderate risk with Class III risk 6.6% risk of major cardiac event.  Please note in his atrial fib his HR is slow for brief periods and with anesthesia that may increase.  Last Holter was last year and one 3.0 sec pause was seen.   Last labs reviewed and Cr in May 2022 was 1.4  echo last year with normal EF recent labs reviewed.   Addendum 12/28/20 Dr. Martinique agrees with above.   2. Permanent atrial fib rate control only and no rate slowing meds.due to bradycardia.  No syncope, lightheadedness. No indication for PPM currently.   3. Chronic diastolic HF euvolemic today. Has chronic SOB without change.   4.  DM-2 per MD last A1C of 8.2 in April 2022 per IM  5.  Aortic insuff stable echo 08/2019   6. HTN controlled.    Current medicines are reviewed with the patient today.  The patient Has no concerns regarding medicines.  The following changes have been made:  See above Labs/ tests ordered today include:see above  Disposition:   FU:  see above  Signed,  Cecilie Kicks, NP  12/27/2020 9:49 AM    [facility] [address], [place] [address] [place], [place] Phone: [phone]; Fax: [phone]

## 2020-12-27 ENCOUNTER — Encounter: Payer: Self-pay | Admitting: Cardiology

## 2020-12-27 ENCOUNTER — Other Ambulatory Visit: Payer: Self-pay

## 2020-12-27 ENCOUNTER — Ambulatory Visit: Payer: Medicare Other | Admitting: Cardiology

## 2020-12-27 VITALS — BP 138/62 | HR 59 | Ht 70.0 in | Wt 212.0 lb

## 2020-12-27 DIAGNOSIS — I10 Essential (primary) hypertension: Secondary | ICD-10-CM | POA: Diagnosis not present

## 2020-12-27 DIAGNOSIS — I482 Chronic atrial fibrillation, unspecified: Secondary | ICD-10-CM

## 2020-12-27 DIAGNOSIS — Z01818 Encounter for other preprocedural examination: Secondary | ICD-10-CM

## 2020-12-27 DIAGNOSIS — I351 Nonrheumatic aortic (valve) insufficiency: Secondary | ICD-10-CM

## 2020-12-27 DIAGNOSIS — I5032 Chronic diastolic (congestive) heart failure: Secondary | ICD-10-CM

## 2020-12-27 NOTE — Patient Instructions (Signed)
Medication Instructions:  Your physician recommends that you continue on your current medications as directed. Please refer to the Current Medication list given to you today.  *If you need a refill on your cardiac medications before your next appointment, please call your pharmacy*   Lab Work: None ordered  If you have labs (blood work) drawn today and your tests are completely normal, you will receive your results only by: [place] (if you have MyChart) OR A paper copy in the mail If you have any lab test that is abnormal or we need to change your treatment, we will call you to review the results.   Testing/Procedures: None ordered   Follow-Up: At [facility], you and your health needs are our priority.  As part of our continuing mission to provide you with exceptional heart care, we have created designated Provider Care Teams.  These Care Teams include your primary Cardiologist (physician) and Advanced Practice Providers (APPs -  Physician Assistants and Nurse Practitioners) who all work together to provide you with the care you need, when you need it.  We recommend signing up for the patient portal called "MyChart".  Sign up information is provided on this After Visit Summary.  MyChart is used to connect with patients for Virtual Visits (Telemedicine).  Patients are able to view lab/test results, encounter notes, upcoming appointments, etc.  Non-urgent messages can be sent to your provider as well.   To learn more about what you can do with MyChart, go to [email or web address].    Your next appointment:   6 month(s)  The format for your next appointment:   In Person  Provider:   You may see None or one of the following Advanced Practice Providers on your designated Care Team:   Almyra Deforest, PA-C Fabian Sharp, PA-C or  Roby Lofts, [place]   Other Instructions

## 2020-12-29 DIAGNOSIS — R310 Gross hematuria: Secondary | ICD-10-CM | POA: Diagnosis not present

## 2020-12-29 DIAGNOSIS — D414 Neoplasm of uncertain behavior of bladder: Secondary | ICD-10-CM | POA: Diagnosis not present

## 2020-12-29 DIAGNOSIS — R800 Isolated proteinuria: Secondary | ICD-10-CM | POA: Diagnosis not present

## 2021-01-02 ENCOUNTER — Other Ambulatory Visit: Payer: Self-pay

## 2021-01-02 ENCOUNTER — Encounter (HOSPITAL_BASED_OUTPATIENT_CLINIC_OR_DEPARTMENT_OTHER): Payer: Self-pay | Admitting: Urology

## 2021-01-02 DIAGNOSIS — Z9989 Dependence on other enabling machines and devices: Secondary | ICD-10-CM

## 2021-01-02 DIAGNOSIS — E1140 Type 2 diabetes mellitus with diabetic neuropathy, unspecified: Secondary | ICD-10-CM

## 2021-01-02 DIAGNOSIS — H9190 Unspecified hearing loss, unspecified ear: Secondary | ICD-10-CM

## 2021-01-02 DIAGNOSIS — G8929 Other chronic pain: Secondary | ICD-10-CM

## 2021-01-02 DIAGNOSIS — Z974 Presence of external hearing-aid: Secondary | ICD-10-CM

## 2021-01-02 DIAGNOSIS — M79604 Pain in right leg: Secondary | ICD-10-CM

## 2021-01-02 DIAGNOSIS — Z973 Presence of spectacles and contact lenses: Secondary | ICD-10-CM

## 2021-01-02 DIAGNOSIS — M25551 Pain in right hip: Secondary | ICD-10-CM

## 2021-01-02 DIAGNOSIS — D494 Neoplasm of unspecified behavior of bladder: Secondary | ICD-10-CM

## 2021-01-02 DIAGNOSIS — M545 Low back pain, unspecified: Secondary | ICD-10-CM

## 2021-01-02 DIAGNOSIS — Z970 Presence of artificial eye: Secondary | ICD-10-CM

## 2021-01-02 DIAGNOSIS — R42 Dizziness and giddiness: Secondary | ICD-10-CM

## 2021-01-02 HISTORY — DX: Low back pain, unspecified: M54.50

## 2021-01-02 HISTORY — DX: Other chronic pain: G89.29

## 2021-01-02 HISTORY — DX: Presence of spectacles and contact lenses: Z97.3

## 2021-01-02 HISTORY — DX: Presence of external hearing-aid: Z97.4

## 2021-01-02 HISTORY — DX: Unspecified hearing loss, unspecified ear: H91.90

## 2021-01-02 HISTORY — DX: Neoplasm of unspecified behavior of bladder: D49.4

## 2021-01-02 HISTORY — DX: Pain in right leg: M79.604

## 2021-01-02 HISTORY — DX: Pain in right hip: M25.551

## 2021-01-02 HISTORY — DX: Type 2 diabetes mellitus with diabetic neuropathy, unspecified: E11.40

## 2021-01-02 HISTORY — DX: Presence of artificial eye: Z97.0

## 2021-01-02 HISTORY — DX: Dependence on other enabling machines and devices: Z99.89

## 2021-01-02 HISTORY — DX: Dizziness and giddiness: R42

## 2021-01-02 NOTE — Progress Notes (Addendum)
Spoke w/ via phone for pre-op interview---pt Lab needs dos----     I stat          Lab results------see below COVID test -----patient states asymptomatic no test needed Arrive at -------730 am 01-08-2021 NPO after MN NO Solid Food.  Clear liquids from MN until---630 am then npo Med rec completed Medications to take morning of surgery -----norvasc Diabetic medication -----none day of surgery Patient instructed to bring photo id and Microbiologist : daughter Manuela Schwartz will stay Pt special instructions: no cigars 24 hours before surgery, bring cpap mask tubing and machine and leave in car Patient verbalized understanding of instructions that were given at this phone interview. Patient denies shortness of breath, chest pain, fever, cough at this phone interview.   Anesthesia Review: hx of afib with chronic anticoagulation, aortic insufficiency, severe osa with cpap use, dm type 2, pt denies cardiac s &s or sob at pre op call  PCP: dr Christiane Ha stoneking Cardiologist :dr Martinique cardiac clearance note 12-28-2020 chart/epic, lov laura ingold np 12-27-2020 epiccardiology clearance note laura ingold np 12-27-2020 chart/epic Chest x-ray : 11-30-2019 epic EKG :12-27-2020 epic Echo :08-26-2019 epic Holter monitor 12-21-2019 epic Stress test: 04-19-2010 epic Cardiac Cath : none Activity level: walks with walker sob after 100 yard ambulation per pt Sleep Study/ CPAP :severe osa uses cpap 5 to 10 cm h20 per home sleep test 07-12-2019 epic Fasting Blood Sugar :  170    / Checks Blood Sugar -sometimes:   Blood Thinner/ Instructions /Last Dose: note to stop eliquis per dr Martinique note 12-28-2020 last dose to be Friday 01-05-2021 pt aware  Pt ambulates with walker may need wheelchair dos, will call front desk if no wheelchair available  Has free style Elenor Legato will be on left arm dos  Pt hoh

## 2021-01-05 ENCOUNTER — Other Ambulatory Visit: Payer: Self-pay | Admitting: Cardiology

## 2021-01-06 ENCOUNTER — Other Ambulatory Visit: Payer: Self-pay | Admitting: Cardiology

## 2021-01-07 NOTE — Anesthesia Preprocedure Evaluation (Addendum)
Anesthesia Evaluation  Patient identified by MRN, date of birth, ID band Patient awake    Reviewed: Allergy & Precautions, NPO status , Patient's Chart, lab work & pertinent test results  Airway Mallampati: II  TM Distance: >3 FB Neck ROM: Full    Dental no notable dental hx. (+) Teeth Intact, Dental Advisory Given   Pulmonary sleep apnea and Continuous Positive Airway Pressure Ventilation , Current Smoker and Patient abstained from smoking.,    Pulmonary exam normal breath sounds clear to auscultation       Cardiovascular hypertension, Pt. on medications Normal cardiovascular exam+ dysrhythmias Atrial Fibrillation  Rhythm:Regular Rate:Abnormal  08/26/19 Echo  1. Left ventricular ejection fraction, by estimation, is 60 to 65%. The  left ventricle has normal function. The left ventricle has no regional  wall motion abnormalities. There is moderate concentric left ventricular  hypertrophy. Left ventricular  diastolic parameters are indeterminate.  2. Right ventricular systolic function is normal. The right ventricular  size is normal. There is mildly elevated pulmonary artery systolic  pressure. The estimated right ventricular systolic pressure is 123XX123 mmHg.  3. Left atrial size was severely dilated.  4. Right atrial size was severely dilated.  5. The mitral valve is abnormal. Trivial mitral valve regurgitation. No  evidence of mitral stenosis.  6. Tricuspid valve regurgitation is moderate.  7. The aortic valve is normal in structure. Aortic valve regurgitation is  moderate. No aortic stenosis is present.  8. Aortic dilatation noted. There is mild dilatation of the ascending  aorta measuring 40 mm.  9. The inferior vena cava is normal in size with greater than 50%  respiratory variability, suggesting right atrial pressure of 3 mmHg.    Neuro/Psych negative neurological ROS     GI/Hepatic hiatal hernia, GERD  Medicated  and Controlled,  Endo/Other  diabetes  Renal/GU Renal InsufficiencyRenal diseaseBladder lesion hematuria. Lab Results      Component                Value               Date                      CREATININE               1.60 (H)            01/08/2021                BUN                      21                  01/08/2021                NA                       139                 01/08/2021                K                        3.5                 01/08/2021                CL  103                 01/08/2021                CO2                      22                  11/26/2019                Musculoskeletal   Abdominal (+) + obese (BMI 30.13),   Peds  Hematology   Anesthesia Other Findings All : Coumadin, Sulfa  Reproductive/Obstetrics                            Anesthesia Physical Anesthesia Plan  ASA: 3  Anesthesia Plan: General   Post-op Pain Management:    Induction: Intravenous  PONV Risk Score and Plan: 2 and Treatment may vary due to age or medical condition and Ondansetron  Airway Management Planned: LMA  Additional Equipment: None  Intra-op Plan:   Post-operative Plan:   Informed Consent: I have reviewed the patients History and Physical, chart, labs and discussed the procedure including the risks, benefits and alternatives for the proposed anesthesia with the patient or authorized representative who has indicated his/her understanding and acceptance.     Dental advisory given  Plan Discussed with: CRNA and Anesthesiologist  Anesthesia Plan Comments:        Anesthesia Quick Evaluation

## 2021-01-08 ENCOUNTER — Ambulatory Visit (HOSPITAL_BASED_OUTPATIENT_CLINIC_OR_DEPARTMENT_OTHER): Payer: Medicare Other | Admitting: Anesthesiology

## 2021-01-08 ENCOUNTER — Encounter (HOSPITAL_BASED_OUTPATIENT_CLINIC_OR_DEPARTMENT_OTHER)
Admission: RE | Disposition: A | Discharge status: Home / Self Care | Payer: Self-pay | Source: Home / Self Care | Attending: Urology

## 2021-01-08 ENCOUNTER — Ambulatory Visit (HOSPITAL_BASED_OUTPATIENT_CLINIC_OR_DEPARTMENT_OTHER)
Admission: RE | Admit: 2021-01-08 | Discharge: 2021-01-08 | Disposition: A | Discharge status: Home / Self Care | Payer: Medicare Other | Attending: Urology | Admitting: Urology

## 2021-01-08 ENCOUNTER — Encounter (HOSPITAL_BASED_OUTPATIENT_CLINIC_OR_DEPARTMENT_OTHER): Payer: Self-pay | Admitting: Urology

## 2021-01-08 DIAGNOSIS — G4733 Obstructive sleep apnea (adult) (pediatric): Secondary | ICD-10-CM | POA: Diagnosis not present

## 2021-01-08 DIAGNOSIS — R310 Gross hematuria: Secondary | ICD-10-CM

## 2021-01-08 DIAGNOSIS — I4891 Unspecified atrial fibrillation: Secondary | ICD-10-CM | POA: Insufficient documentation

## 2021-01-08 DIAGNOSIS — F17290 Nicotine dependence, other tobacco product, uncomplicated: Secondary | ICD-10-CM | POA: Diagnosis not present

## 2021-01-08 DIAGNOSIS — I082 Rheumatic disorders of both aortic and tricuspid valves: Secondary | ICD-10-CM | POA: Diagnosis not present

## 2021-01-08 DIAGNOSIS — I10 Essential (primary) hypertension: Secondary | ICD-10-CM | POA: Diagnosis not present

## 2021-01-08 DIAGNOSIS — Z9989 Dependence on other enabling machines and devices: Secondary | ICD-10-CM | POA: Diagnosis not present

## 2021-01-08 DIAGNOSIS — Z7901 Long term (current) use of anticoagulants: Secondary | ICD-10-CM | POA: Insufficient documentation

## 2021-01-08 DIAGNOSIS — N401 Enlarged prostate with lower urinary tract symptoms: Secondary | ICD-10-CM | POA: Diagnosis not present

## 2021-01-08 DIAGNOSIS — N3031 Trigonitis with hematuria: Secondary | ICD-10-CM | POA: Diagnosis not present

## 2021-01-08 DIAGNOSIS — E1140 Type 2 diabetes mellitus with diabetic neuropathy, unspecified: Secondary | ICD-10-CM | POA: Insufficient documentation

## 2021-01-08 DIAGNOSIS — N400 Enlarged prostate without lower urinary tract symptoms: Secondary | ICD-10-CM | POA: Diagnosis not present

## 2021-01-08 DIAGNOSIS — N329 Bladder disorder, unspecified: Secondary | ICD-10-CM | POA: Diagnosis not present

## 2021-01-08 HISTORY — DX: Concussion with loss of consciousness of unspecified duration, initial encounter: S06.0X9A

## 2021-01-08 HISTORY — DX: Hematuria, unspecified: R31.9

## 2021-01-08 HISTORY — DX: Concussion with loss of consciousness status unknown, initial encounter: S06.0XAA

## 2021-01-08 HISTORY — PX: TRANSURETHRAL RESECTION OF BLADDER TUMOR: SHX2575

## 2021-01-08 HISTORY — PX: CYSTOSCOPY: SHX5120

## 2021-01-08 LAB — POCT I-STAT, CHEM 8
BUN: 21 mg/dL (ref 8–23)
Calcium, Ion: 1.12 mmol/L — ABNORMAL LOW (ref 1.15–1.40)
Chloride: 103 mmol/L (ref 98–111)
Creatinine, Ser: 1.6 mg/dL — ABNORMAL HIGH (ref 0.61–1.24)
Glucose, Bld: 184 mg/dL — ABNORMAL HIGH (ref 70–99)
HCT: 37 % — ABNORMAL LOW (ref 39.0–52.0)
Hemoglobin: 12.6 g/dL — ABNORMAL LOW (ref 13.0–17.0)
Potassium: 3.5 mmol/L (ref 3.5–5.1)
Sodium: 139 mmol/L (ref 135–145)
TCO2: 21 mmol/L — ABNORMAL LOW (ref 22–32)

## 2021-01-08 LAB — GLUCOSE, CAPILLARY: Glucose-Capillary: 163 mg/dL — ABNORMAL HIGH (ref 70–99)

## 2021-01-08 SURGERY — TURBT (TRANSURETHRAL RESECTION OF BLADDER TUMOR)
Anesthesia: General | Site: Urethra

## 2021-01-08 MED ORDER — FENTANYL CITRATE (PF) 100 MCG/2ML IJ SOLN: 25.0000 ug | INTRAMUSCULAR | Status: DC | PRN

## 2021-01-08 MED ORDER — SODIUM CHLORIDE 0.9 % IV SOLN
INTRAVENOUS | Status: AC
  Filled 2021-01-08: qty 2

## 2021-01-08 MED ORDER — CEFAZOLIN SODIUM-DEXTROSE 2-3 GM-%(50ML) IV SOLR
INTRAVENOUS | Status: DC | PRN
  Administered 2021-01-08: 2 g via INTRAVENOUS

## 2021-01-08 MED ORDER — FENTANYL CITRATE (PF) 100 MCG/2ML IJ SOLN
INTRAMUSCULAR | Status: DC | PRN
  Administered 2021-01-08 (×2): 25 ug via INTRAVENOUS

## 2021-01-08 MED ORDER — FENTANYL CITRATE (PF) 100 MCG/2ML IJ SOLN
INTRAMUSCULAR | Status: AC
  Filled 2021-01-08: qty 2

## 2021-01-08 MED ORDER — PROPOFOL 10 MG/ML IV BOLUS
INTRAVENOUS | Status: AC
  Filled 2021-01-08: qty 20

## 2021-01-08 MED ORDER — CEPHALEXIN 500 MG PO CAPS: 500.0000 mg | ORAL_CAPSULE | Freq: Two times a day (BID) | ORAL | 0 refills | Status: AC

## 2021-01-08 MED ORDER — LIDOCAINE HCL (PF) 2 % IJ SOLN
INTRAMUSCULAR | Status: AC
  Filled 2021-01-08: qty 5

## 2021-01-08 MED ORDER — LIDOCAINE HCL (CARDIAC) PF 100 MG/5ML IV SOSY
PREFILLED_SYRINGE | INTRAVENOUS | Status: DC | PRN
  Administered 2021-01-08: 80 mg via INTRAVENOUS

## 2021-01-08 MED ORDER — ONDANSETRON HCL 4 MG/2ML IJ SOLN: 4.0000 mg | Freq: Once | INTRAMUSCULAR | Status: DC | PRN

## 2021-01-08 MED ORDER — ONDANSETRON HCL 4 MG/2ML IJ SOLN
INTRAMUSCULAR | Status: DC | PRN
Start: 2021-01-08 — End: 2021-01-08
  Administered 2021-01-08: 4 mg via INTRAVENOUS

## 2021-01-08 MED ORDER — DEXAMETHASONE SODIUM PHOSPHATE 4 MG/ML IJ SOLN
INTRAMUSCULAR | Status: DC | PRN
Start: 2021-01-08 — End: 2021-01-08
  Administered 2021-01-08: 4 mg via INTRAVENOUS

## 2021-01-08 MED ORDER — SODIUM CHLORIDE 0.9 % IR SOLN
Status: DC | PRN
  Administered 2021-01-08: 6000 mL
  Administered 2021-01-08: 3000 mL

## 2021-01-08 MED ORDER — PROPOFOL 10 MG/ML IV BOLUS
INTRAVENOUS | Status: DC | PRN
  Administered 2021-01-08: 100 mg via INTRAVENOUS

## 2021-01-08 MED ORDER — ACETAMINOPHEN 10 MG/ML IV SOLN: 1000.0000 mg | Freq: Once | INTRAVENOUS | Status: DC | PRN

## 2021-01-08 MED ORDER — ONDANSETRON HCL 4 MG/2ML IJ SOLN
INTRAMUSCULAR | Status: AC
  Filled 2021-01-08: qty 2

## 2021-01-08 MED ORDER — LACTATED RINGERS IV SOLN: INTRAVENOUS | Status: DC

## 2021-01-08 SURGICAL SUPPLY — 23 items
BAG DRAIN URO-CYSTO SKYTR STRL (DRAIN) ×3 IMPLANT
BAG DRN RND TRDRP ANRFLXCHMBR (UROLOGICAL SUPPLIES) ×2
BAG DRN UROCATH (DRAIN) ×2
BAG URINE DRAIN 2000ML AR STRL (UROLOGICAL SUPPLIES) ×1 IMPLANT
CATH FOLEY 2WAY SLVR  5CC 20FR (CATHETERS) ×3
CATH FOLEY 2WAY SLVR 5CC 20FR (CATHETERS) IMPLANT
CLOTH BEACON ORANGE TIMEOUT ST (SAFETY) ×3 IMPLANT
GLOVE SURG ENC MOIS LTX SZ8 (GLOVE) ×3 IMPLANT
GLOVE SURG POLYISO LF SZ6.5 (GLOVE) ×1 IMPLANT
GLOVE SURG UNDER POLY LF SZ6.5 (GLOVE) ×1 IMPLANT
GOWN STRL REUS W/TWL XL LVL3 (GOWN DISPOSABLE) ×3 IMPLANT
HOLDER FOLEY CATH W/STRAP (MISCELLANEOUS) ×1 IMPLANT
IV NS IRRIG 3000ML ARTHROMATIC (IV SOLUTION) ×5 IMPLANT
KIT TURNOVER CYSTO (KITS) ×3 IMPLANT
LOOP CUT BIPOLAR 24F LRG (ELECTROSURGICAL) ×3 IMPLANT
MANIFOLD NEPTUNE II (INSTRUMENTS) ×3 IMPLANT
NS IRRIG 500ML POUR BTL (IV SOLUTION) ×3 IMPLANT
PACK CYSTO (CUSTOM PROCEDURE TRAY) ×3 IMPLANT
SYR TOOMEY IRRIG 70ML (MISCELLANEOUS) ×3
SYRINGE TOOMEY IRRIG 70ML (MISCELLANEOUS) ×2 IMPLANT
TUBE CONNECTING 12X1/4 (SUCTIONS) ×3 IMPLANT
TUBING UROLOGY SET (TUBING) ×1 IMPLANT
WATER STERILE IRR 500ML POUR (IV SOLUTION) ×1 IMPLANT

## 2021-01-08 NOTE — Interval H&P Note (Signed)
History and Physical Interval Note:  01/08/2021 9:07 AM  Anthony Ho  has presented today for surgery, with the diagnosis of BLADDER LESION, HEMATURIA.  The various methods of treatment have been discussed with the patient and family. After consideration of risks, benefits and other options for treatment, the patient has consented to  Procedure(s) with comments: TRANSURETHRAL RESECTION OF BLADDER TUMOR (TURBT) (N/A) - 1 HR as a surgical intervention.  The patient's history has been reviewed, patient examined, no change in status, stable for surgery.  I have reviewed the patient's chart and labs.  Questions were answered to the patient's satisfaction.     [name] [name]

## 2021-01-08 NOTE — Transfer of Care (Signed)
Immediate Anesthesia Transfer of Care Note  Patient: Anthony Ho  Procedure(s) Performed: TRANSURETHRAL RESECTION OF THE PROSTATE (Urethra) CYSTOSCOPY (Urethra)  Patient Location: PACU  Anesthesia Type:General  Level of Consciousness: sedated  Airway & Oxygen Therapy: Patient Spontanous Breathing and Patient connected to face mask oxygen  Post-op Assessment: Report given to RN and Post -op Vital signs reviewed and stable  Post vital signs: Reviewed and stable  Last Vitals:  Vitals Value Taken Time  BP 156/65 01/08/21 1045  Temp 36.4 C 01/08/21 1035  Pulse 56 01/08/21 1100  Resp 7 01/08/21 1100  SpO2 94 % 01/08/21 1100  Vitals shown include unvalidated device data.  Last Pain:  Vitals:   01/08/21 1035  TempSrc:   PainSc: 0-No pain      Patients Stated Pain Goal: 3 ([phone])  Complications: No notable events documented.

## 2021-01-08 NOTE — Op Note (Signed)
Preoperative diagnosis, gross hematuria with bladder lesion  Postoperative diagnosis: Same, no evidence of bladder lesion but reactive change of bladder neck/prostatic urethra  Principal procedure: Cystoscopy, TUR P  Surgeon: Aniesa Boback  Anesthesia: General with LMA  Complications: None  Specimen: Prostate chips, for permanent specimen  Estimated blood loss: Less than 5 mL  Indications: 87 year old male with history of nephrogenic adenoma of the bladder.  He has had recent significant gross hematuria.  Evaluation included CT abdomen/pelvis/hematuria protocol and cystoscopy.  CT was normal, cystoscopy revealed erythematous, inflammatory lesions in the trigonal region.  The patient had been on Coumadin, has recently been switched to Eliquis.  He states that in the past few weeks there has been no significant hematuria.  He presents now for cystoscopy and bladder biopsy/possible TURBT.  I have talked with the patient and his daughter, Manuela Schwartz about the procedure, expected risks, complications and outcomes.  They desire to proceed.  Findings: Urethra was normal, without stricture.  Prostatic urethra revealed prior TURP but there was significant regrowth of tissue with bilobar obstruction.  At the bladder neck/prostatic junction, more on the right than the left there were bullous changes of the urothelium.  No discrete papillary lesions were seen.  Inspection of the bladder revealed prior resection in the trigonal region.  Ureteral orifice ease were normal in configuration location.  There were no lesions within the bladder.  There were mild trabeculations.  Description of procedure: The patient was properly identified in the holding area.  Is taken to the operating room where general anesthetic was administered with the LMA.  Is placed in the dorsolithotomy position.  Genitalia and perineum were prepped, draped, proper timeout performed.  58 French resectoscope sheath was easily passed using the  visual obturator.  Circumferential inspection was performed with the above-mentioned findings.  There was no significant inflammatory lesion in the trigone which had previously been identified.  However, there was some bullous changes of the prostatic urethra at the bladder neck, more on the right than the left.  These may well have been the areas that were bleeding.  No discrete lesions were seen otherwise.  I felt it worthwhile to resect some of the obstructing prostatic tissue at the bladder neck with these changes on it.  I then placed the resectoscope and the cutting loop.  The prostate was easily trimmed, mainly in the bladder neck on the right side.  All these lesions were incorporated in the resection.  The resection was carried out from the 12 o'clock position to approximately the 7 o'clock position.  Most of the prostatic tissue was left, however.  There was an adequate channel at this point.  I carefully electrocoagulated all resection sites.  No significant bleeding was seen at this point.  The chips were irrigated from the bladder with a Toomey syringe and sent for pathology labeled "prostate chips".  Careful inspection of the prostatic urethra again revealed no bleeding.  The scope was removed and I placed a 20 French Foley catheter, balloon filled with 10 cc of water and hooked to bag drainage.  At this point, the procedure was terminated.  The patient was awakened and taken to the PACU in stable condition having tolerated the procedure well.

## 2021-01-08 NOTE — Discharge Instructions (Addendum)
You may see some blood in the urine and may have some burning with urination for 48-72 hours. You also may notice that you have to urinate more frequently or urgently after your procedure which is normal.  You should call should you develop an inability urinate, fever > 101, persistent nausea and vomiting that prevents you from eating or drinking to stay hydrated.   If you have a catheter, you will be taught how to take care of the catheter by the nursing staff prior to discharge from the hospital.  You may periodically feel a strong urge to void with the catheter in place.  This is a bladder spasm and most often can occur when having a bowel movement or moving around. It is typically self-limited and usually will stop after a few minutes.  You may use some Vaseline or Neosporin around the tip of the catheter to reduce friction at the tip of the penis. You may also see some blood in the urine.  A very small amount of blood can make the urine look quite red.  As long as the catheter is draining well, there usually is not a problem.  However, if the catheter is not draining well and is bloody, you should call the office [phone]) to notify us.  If the urine is fairly clear by Tuesday morning, it is okay to remove it as instructed by the nurses.       OK to restart Eliquis after urine turns yellow Post Anesthesia Home Care Instructions  Activity: Get plenty of rest for the remainder of the day. A responsible adult should stay with you for 24 hours following the procedure.  For the next 24 hours, DO NOT: -Drive a car -Paediatric nurse -Drink alcoholic beverages -Take any medication unless instructed by your physician -Make any legal decisions or sign important papers.  Meals: Start with liquid foods such as gelatin or soup. Progress to regular foods as tolerated. Avoid greasy, spicy, heavy foods. If nausea and/or vomiting occur, drink only clear liquids until the nausea and/or vomiting subsides.  Call your physician if vomiting continues.  Special Instructions/Symptoms: Your throat may feel dry or sore from the anesthesia or the breathing tube placed in your throat during surgery. If this causes discomfort, gargle with warm salt water. The discomfort should disappear within 24 hours.

## 2021-01-08 NOTE — Anesthesia Postprocedure Evaluation (Signed)
Anesthesia Post Note  Patient: Anthony Ho  Procedure(s) Performed: TRANSURETHRAL RESECTION OF THE PROSTATE (Urethra) CYSTOSCOPY (Urethra)     Patient location during evaluation: PACU Anesthesia Type: General Level of consciousness: awake and alert Pain management: pain level controlled Vital Signs Assessment: post-procedure vital signs reviewed and stable Respiratory status: spontaneous breathing, nonlabored ventilation, respiratory function stable and patient connected to nasal cannula oxygen Cardiovascular status: blood pressure returned to baseline and stable Postop Assessment: no apparent nausea or vomiting Anesthetic complications: no   No notable events documented.  Last Vitals:  Vitals:   01/08/21 1146 01/08/21 1157  BP: (!) 165/66 (!) 166/64  Pulse: 60 62  Resp: 16   Temp: (!) 36.4 C   SpO2: 97%     Last Pain:  Vitals:   01/08/21 1146  TempSrc:   PainSc: 0-No pain                 Barnet Glasgow
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
## 2021-01-08 NOTE — Anesthesia Procedure Notes (Signed)
Procedure Name: LMA Insertion Date/Time: 01/08/2021 9:27 AM Performed by: Maryella Shivers, CRNA Pre-anesthesia Checklist: Patient identified, Emergency Drugs available, Suction available and Patient being monitored Patient Re-evaluated:Patient Re-evaluated prior to induction Oxygen Delivery Method: Circle system utilized Preoxygenation: Pre-oxygenation with 100% oxygen Induction Type: IV induction Ventilation: Mask ventilation without difficulty LMA: LMA inserted LMA Size: 5.0 Number of attempts: 1 Airway Equipment and Method: Bite block Placement Confirmation: positive ETCO2 Tube secured with: Tape Dental Injury: Teeth and Oropharynx as per pre-operative assessment

## 2021-01-09 ENCOUNTER — Encounter (HOSPITAL_BASED_OUTPATIENT_CLINIC_OR_DEPARTMENT_OTHER): Payer: Self-pay | Admitting: Urology

## 2021-01-09 LAB — SURGICAL PATHOLOGY

## 2021-01-19 DIAGNOSIS — Z4420 Encounter for fitting and adjustment of artificial eye, unspecified: Secondary | ICD-10-CM | POA: Diagnosis not present

## 2021-01-29 DIAGNOSIS — H524 Presbyopia: Secondary | ICD-10-CM | POA: Diagnosis not present

## 2021-01-31 DIAGNOSIS — R310 Gross hematuria: Secondary | ICD-10-CM | POA: Diagnosis not present

## 2021-02-06 ENCOUNTER — Other Ambulatory Visit: Payer: Self-pay

## 2021-02-06 MED ORDER — ELIQUIS 2.5 MG PO TABS
2.5000 mg | ORAL_TABLET | Freq: Two times a day (BID) | ORAL | 1 refills | Status: DC
Start: 2021-02-06 — End: 2021-08-13

## 2021-02-06 NOTE — Telephone Encounter (Signed)
Prescription refill request for Eliquis received. Indication:afib Last office visit:[facility] 12/27/20 Scr:1.600 mg/ 01/08/2021 Age: 28mWeight:96.2kg

## 2021-02-26 DIAGNOSIS — N1832 Chronic kidney disease, stage 3b: Secondary | ICD-10-CM | POA: Diagnosis not present

## 2021-02-26 DIAGNOSIS — Z0000 Encounter for general adult medical examination without abnormal findings: Secondary | ICD-10-CM | POA: Diagnosis not present

## 2021-02-26 DIAGNOSIS — E7800 Pure hypercholesterolemia, unspecified: Secondary | ICD-10-CM | POA: Diagnosis not present

## 2021-02-26 DIAGNOSIS — Z1389 Encounter for screening for other disorder: Secondary | ICD-10-CM | POA: Diagnosis not present

## 2021-02-26 DIAGNOSIS — E1121 Type 2 diabetes mellitus with diabetic nephropathy: Secondary | ICD-10-CM | POA: Diagnosis not present

## 2021-02-26 DIAGNOSIS — Z23 Encounter for immunization: Secondary | ICD-10-CM | POA: Diagnosis not present

## 2021-02-26 DIAGNOSIS — I4821 Permanent atrial fibrillation: Secondary | ICD-10-CM | POA: Diagnosis not present

## 2021-02-26 DIAGNOSIS — D6869 Other thrombophilia: Secondary | ICD-10-CM | POA: Diagnosis not present

## 2021-02-26 DIAGNOSIS — G4733 Obstructive sleep apnea (adult) (pediatric): Secondary | ICD-10-CM | POA: Diagnosis not present

## 2021-02-26 DIAGNOSIS — D696 Thrombocytopenia, unspecified: Secondary | ICD-10-CM | POA: Diagnosis not present

## 2021-03-01 DIAGNOSIS — E7800 Pure hypercholesterolemia, unspecified: Secondary | ICD-10-CM | POA: Diagnosis not present

## 2021-03-01 DIAGNOSIS — E1142 Type 2 diabetes mellitus with diabetic polyneuropathy: Secondary | ICD-10-CM | POA: Diagnosis not present

## 2021-03-01 DIAGNOSIS — E1121 Type 2 diabetes mellitus with diabetic nephropathy: Secondary | ICD-10-CM | POA: Diagnosis not present

## 2021-03-01 DIAGNOSIS — I129 Hypertensive chronic kidney disease with stage 1 through stage 4 chronic kidney disease, or unspecified chronic kidney disease: Secondary | ICD-10-CM | POA: Diagnosis not present

## 2021-03-20 DIAGNOSIS — D6869 Other thrombophilia: Secondary | ICD-10-CM | POA: Diagnosis not present

## 2021-03-20 DIAGNOSIS — I129 Hypertensive chronic kidney disease with stage 1 through stage 4 chronic kidney disease, or unspecified chronic kidney disease: Secondary | ICD-10-CM | POA: Diagnosis not present

## 2021-03-20 DIAGNOSIS — I4821 Permanent atrial fibrillation: Secondary | ICD-10-CM | POA: Diagnosis not present

## 2021-03-20 DIAGNOSIS — N1832 Chronic kidney disease, stage 3b: Secondary | ICD-10-CM | POA: Diagnosis not present

## 2021-03-22 ENCOUNTER — Ambulatory Visit: Payer: Medicare Other | Admitting: Adult Health

## 2021-04-17 ENCOUNTER — Other Ambulatory Visit: Payer: Self-pay

## 2021-04-17 ENCOUNTER — Emergency Department (HOSPITAL_COMMUNITY)
Admission: EM | Admit: 2021-04-17 | Discharge: 2021-04-17 | Disposition: A | Discharge status: Home / Self Care | Payer: Medicare Other | Attending: Emergency Medicine | Admitting: Emergency Medicine

## 2021-04-17 ENCOUNTER — Emergency Department (HOSPITAL_COMMUNITY): Payer: Medicare Other

## 2021-04-17 ENCOUNTER — Encounter (HOSPITAL_COMMUNITY): Payer: Self-pay | Admitting: Emergency Medicine

## 2021-04-17 DIAGNOSIS — F17290 Nicotine dependence, other tobacco product, uncomplicated: Secondary | ICD-10-CM | POA: Diagnosis not present

## 2021-04-17 DIAGNOSIS — S060X0A Concussion without loss of consciousness, initial encounter: Secondary | ICD-10-CM | POA: Insufficient documentation

## 2021-04-17 DIAGNOSIS — M19012 Primary osteoarthritis, left shoulder: Secondary | ICD-10-CM | POA: Diagnosis not present

## 2021-04-17 DIAGNOSIS — Z043 Encounter for examination and observation following other accident: Secondary | ICD-10-CM | POA: Diagnosis not present

## 2021-04-17 DIAGNOSIS — S299XXA Unspecified injury of thorax, initial encounter: Secondary | ICD-10-CM | POA: Diagnosis not present

## 2021-04-17 DIAGNOSIS — W19XXXA Unspecified fall, initial encounter: Secondary | ICD-10-CM | POA: Diagnosis not present

## 2021-04-17 DIAGNOSIS — E119 Type 2 diabetes mellitus without complications: Secondary | ICD-10-CM | POA: Diagnosis not present

## 2021-04-17 DIAGNOSIS — I10 Essential (primary) hypertension: Secondary | ICD-10-CM | POA: Insufficient documentation

## 2021-04-17 DIAGNOSIS — Z79899 Other long term (current) drug therapy: Secondary | ICD-10-CM | POA: Diagnosis not present

## 2021-04-17 DIAGNOSIS — S40012A Contusion of left shoulder, initial encounter: Secondary | ICD-10-CM | POA: Insufficient documentation

## 2021-04-17 DIAGNOSIS — T148XXA Other injury of unspecified body region, initial encounter: Secondary | ICD-10-CM

## 2021-04-17 DIAGNOSIS — S4992XA Unspecified injury of left shoulder and upper arm, initial encounter: Secondary | ICD-10-CM | POA: Diagnosis not present

## 2021-04-17 DIAGNOSIS — W07XXXA Fall from chair, initial encounter: Secondary | ICD-10-CM | POA: Diagnosis not present

## 2021-04-17 DIAGNOSIS — M25519 Pain in unspecified shoulder: Secondary | ICD-10-CM | POA: Diagnosis not present

## 2021-04-17 DIAGNOSIS — G936 Cerebral edema: Secondary | ICD-10-CM | POA: Diagnosis not present

## 2021-04-17 LAB — BASIC METABOLIC PANEL
Anion gap: 14 (ref 5–15)
BUN: 28 mg/dL — ABNORMAL HIGH (ref 8–23)
CO2: 19 mmol/L — ABNORMAL LOW (ref 22–32)
Calcium: 9.3 mg/dL (ref 8.9–10.3)
Chloride: 101 mmol/L (ref 98–111)
Creatinine, Ser: 1.85 mg/dL — ABNORMAL HIGH (ref 0.61–1.24)
GFR, Estimated: 35 mL/min — ABNORMAL LOW (ref >60)
Glucose, Bld: 178 mg/dL — ABNORMAL HIGH (ref 70–99)
Potassium: 3.5 mmol/L (ref 3.5–5.1)
Sodium: 134 mmol/L — ABNORMAL LOW (ref 135–145)

## 2021-04-17 LAB — CBC
HCT: 39.6 % (ref 39.0–52.0)
Hemoglobin: 13.1 g/dL (ref 13.0–17.0)
MCH: 26.3 pg (ref 26.0–34.0)
MCHC: 33.1 g/dL (ref 30.0–36.0)
MCV: 79.4 fL — ABNORMAL LOW (ref 80.0–100.0)
Platelets: 154 10*3/uL (ref 150–400)
RBC: 4.99 MIL/uL (ref 4.22–5.81)
RDW: 16.7 % — ABNORMAL HIGH (ref 11.5–15.5)
WBC: 12.1 10*3/uL — ABNORMAL HIGH (ref 4.0–10.5)
nRBC: 0 % (ref 0.0–0.2)

## 2021-04-17 MED ORDER — MORPHINE SULFATE (PF) 4 MG/ML IV SOLN
4.0000 mg | Freq: Once | INTRAVENOUS | Status: AC
  Administered 2021-04-17: 4 mg via INTRAVENOUS
  Filled 2021-04-17: qty 1

## 2021-04-17 MED ORDER — HYDROCODONE-ACETAMINOPHEN 5-325 MG PO TABS
1.0000 | ORAL_TABLET | Freq: Once | ORAL | Status: AC
  Administered 2021-04-17: 1 via ORAL
  Filled 2021-04-17: qty 1

## 2021-04-17 MED ORDER — HYDROCODONE-ACETAMINOPHEN 5-325 MG PO TABS
1.0000 | ORAL_TABLET | Freq: Four times a day (QID) | ORAL | 0 refills | Status: DC | PRN
Start: 2021-04-17 — End: 2021-07-13

## 2021-04-17 NOTE — ED Triage Notes (Signed)
Pt arrived via EMS from home. Pt tripped over a chair and hit his left shoulder on the ground. Pt has a palpable deformity to his left shoulder per EMS. Pt denies LOC and hitting his head. Pt is currently on a blood thinner. Pt has hx of a fib. Pt has palpable radial pulses bilaterally.

## 2021-04-17 NOTE — Discharge Instructions (Addendum)
Apply ice to help for comfort.  Take the medications as needed for pain.  Follow-up with your doctor and orthopedic doctor to be rechecked
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
## 2021-04-20 DIAGNOSIS — K5901 Slow transit constipation: Secondary | ICD-10-CM | POA: Diagnosis not present

## 2021-04-20 DIAGNOSIS — M25512 Pain in left shoulder: Secondary | ICD-10-CM | POA: Diagnosis not present

## 2021-04-20 DIAGNOSIS — I4821 Permanent atrial fibrillation: Secondary | ICD-10-CM | POA: Diagnosis not present

## 2021-04-20 DIAGNOSIS — N1832 Chronic kidney disease, stage 3b: Secondary | ICD-10-CM | POA: Diagnosis not present

## 2021-04-20 DIAGNOSIS — R269 Unspecified abnormalities of gait and mobility: Secondary | ICD-10-CM | POA: Diagnosis not present

## 2021-04-25 DIAGNOSIS — M6281 Muscle weakness (generalized): Secondary | ICD-10-CM | POA: Diagnosis not present

## 2021-04-25 DIAGNOSIS — E119 Type 2 diabetes mellitus without complications: Secondary | ICD-10-CM | POA: Diagnosis not present

## 2021-04-25 DIAGNOSIS — I4821 Permanent atrial fibrillation: Secondary | ICD-10-CM | POA: Diagnosis not present

## 2021-04-25 DIAGNOSIS — R2689 Other abnormalities of gait and mobility: Secondary | ICD-10-CM | POA: Diagnosis not present

## 2021-04-25 DIAGNOSIS — G9009 Other idiopathic peripheral autonomic neuropathy: Secondary | ICD-10-CM | POA: Diagnosis not present

## 2021-04-25 DIAGNOSIS — I4891 Unspecified atrial fibrillation: Secondary | ICD-10-CM | POA: Diagnosis not present

## 2021-04-27 DIAGNOSIS — I4821 Permanent atrial fibrillation: Secondary | ICD-10-CM | POA: Diagnosis not present

## 2021-04-27 DIAGNOSIS — I4891 Unspecified atrial fibrillation: Secondary | ICD-10-CM | POA: Diagnosis not present

## 2021-04-27 DIAGNOSIS — G9009 Other idiopathic peripheral autonomic neuropathy: Secondary | ICD-10-CM | POA: Diagnosis not present

## 2021-04-27 DIAGNOSIS — M6281 Muscle weakness (generalized): Secondary | ICD-10-CM | POA: Diagnosis not present

## 2021-04-27 DIAGNOSIS — E119 Type 2 diabetes mellitus without complications: Secondary | ICD-10-CM | POA: Diagnosis not present

## 2021-04-27 DIAGNOSIS — R2689 Other abnormalities of gait and mobility: Secondary | ICD-10-CM | POA: Diagnosis not present

## 2021-05-01 DIAGNOSIS — I4821 Permanent atrial fibrillation: Secondary | ICD-10-CM | POA: Diagnosis not present

## 2021-05-01 DIAGNOSIS — D6869 Other thrombophilia: Secondary | ICD-10-CM | POA: Diagnosis not present

## 2021-05-01 DIAGNOSIS — M25512 Pain in left shoulder: Secondary | ICD-10-CM | POA: Diagnosis not present

## 2021-05-01 DIAGNOSIS — N1832 Chronic kidney disease, stage 3b: Secondary | ICD-10-CM | POA: Diagnosis not present

## 2021-05-02 ENCOUNTER — Other Ambulatory Visit (HOSPITAL_COMMUNITY): Payer: Self-pay

## 2021-05-02 DIAGNOSIS — I4891 Unspecified atrial fibrillation: Secondary | ICD-10-CM | POA: Diagnosis not present

## 2021-05-02 DIAGNOSIS — G9009 Other idiopathic peripheral autonomic neuropathy: Secondary | ICD-10-CM | POA: Diagnosis not present

## 2021-05-02 DIAGNOSIS — E119 Type 2 diabetes mellitus without complications: Secondary | ICD-10-CM | POA: Diagnosis not present

## 2021-05-02 DIAGNOSIS — I4821 Permanent atrial fibrillation: Secondary | ICD-10-CM | POA: Diagnosis not present

## 2021-05-02 DIAGNOSIS — R2689 Other abnormalities of gait and mobility: Secondary | ICD-10-CM | POA: Diagnosis not present

## 2021-05-02 DIAGNOSIS — M6281 Muscle weakness (generalized): Secondary | ICD-10-CM | POA: Diagnosis not present

## 2021-05-02 MED ORDER — OXYCODONE HCL 10 MG PO TABS
10.0000 mg | ORAL_TABLET | Freq: Three times a day (TID) | ORAL | 0 refills | Status: DC | PRN
  Filled 2021-05-02: qty 60, 20d supply, fill #0

## 2021-05-04 IMAGING — CR DG ABDOMEN 2V
3 series · 3 of 3 positions shown · non-contrast
Comparison: CT of the abdomen and pelvis on 04/09/2017

CLINICAL DATA: C/o pain that starts Lt anterior to lower LT side
back x 2-3 days. No hx of the same or surgery

EXAM:
ABDOMEN - 2 VIEW

[w abdomen upright]
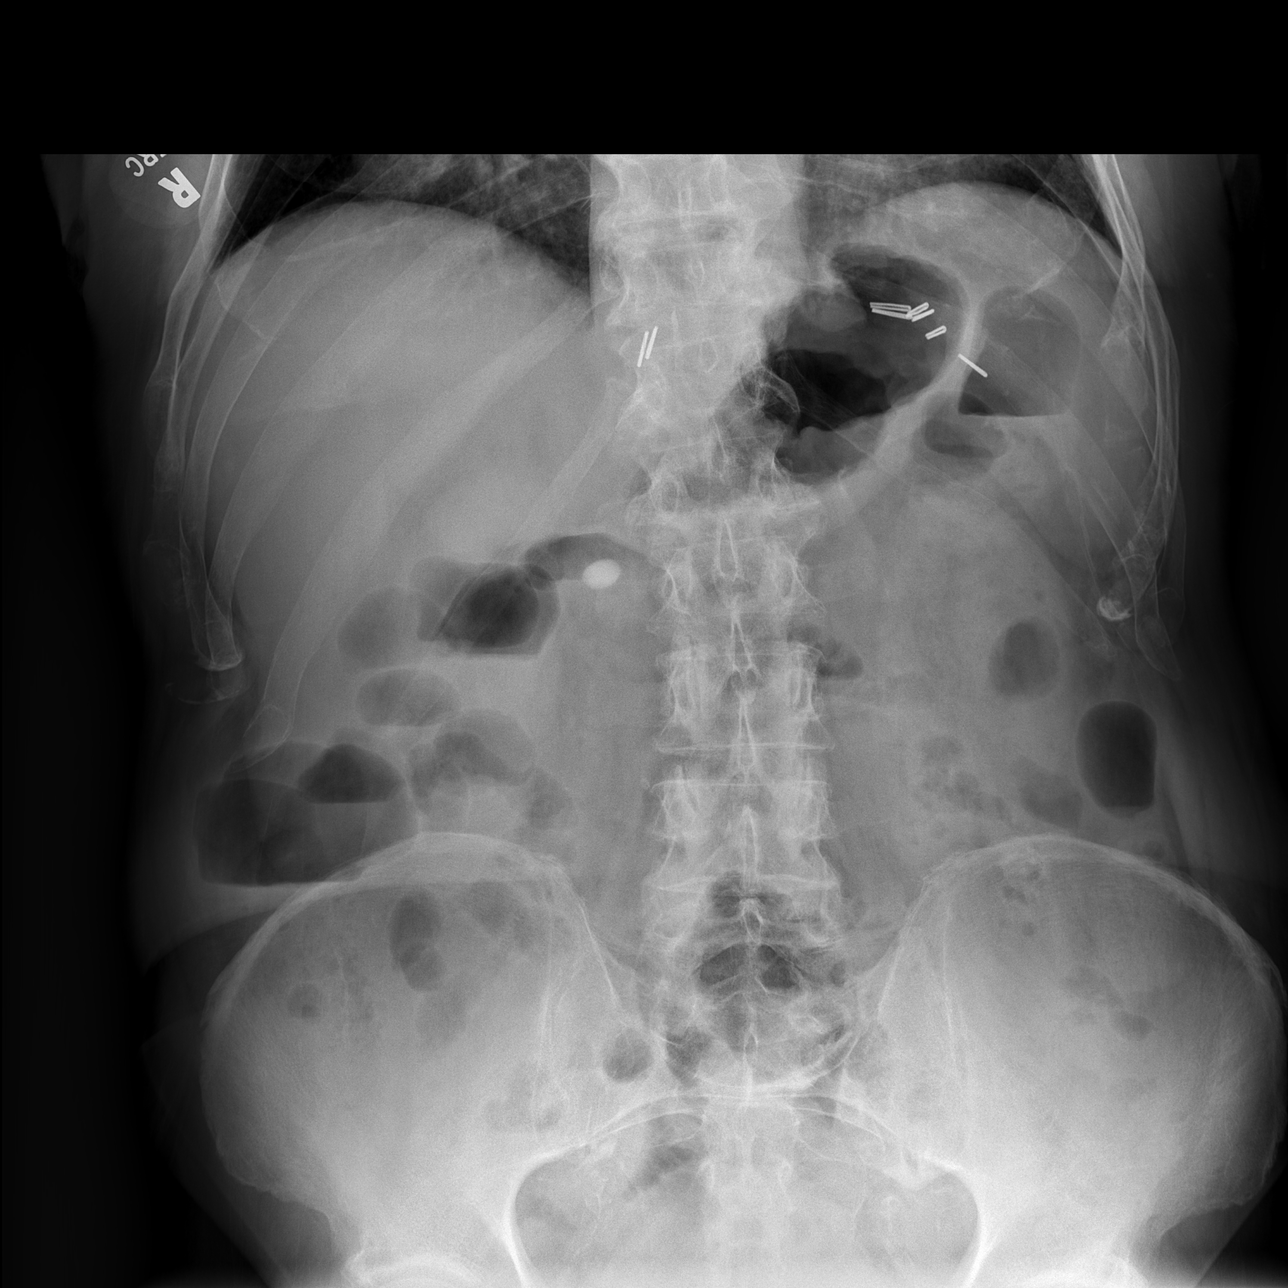

[t abdomen supine (1 of 2)]
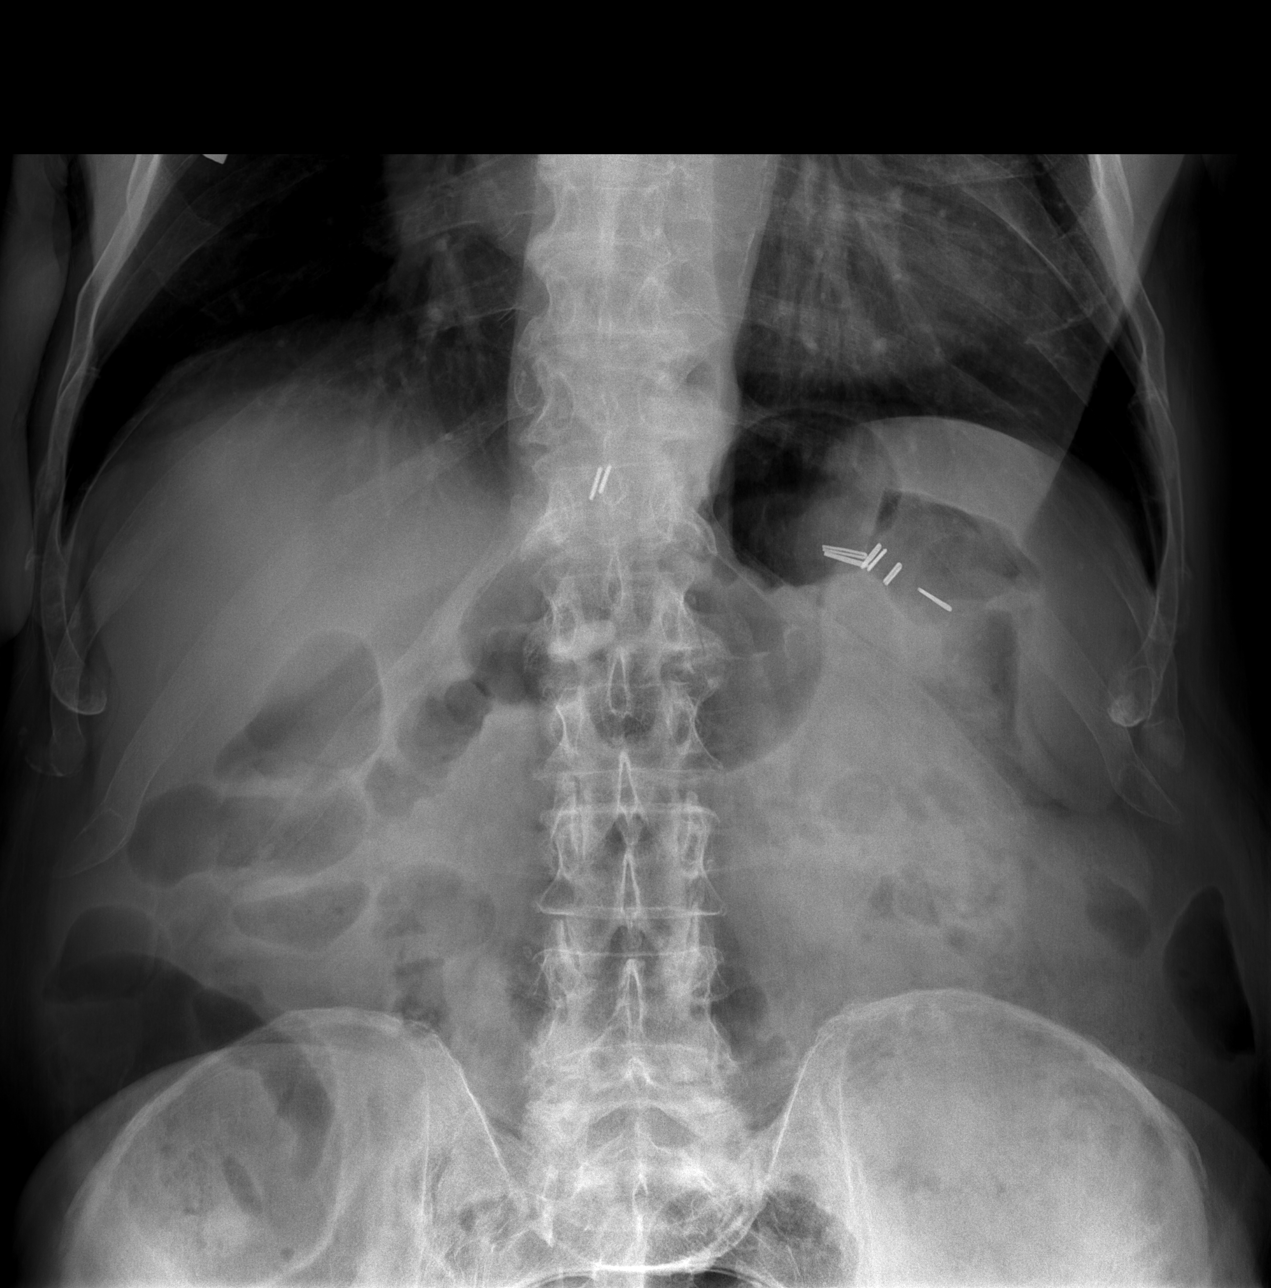

[t abdomen supine (2 of 2)]
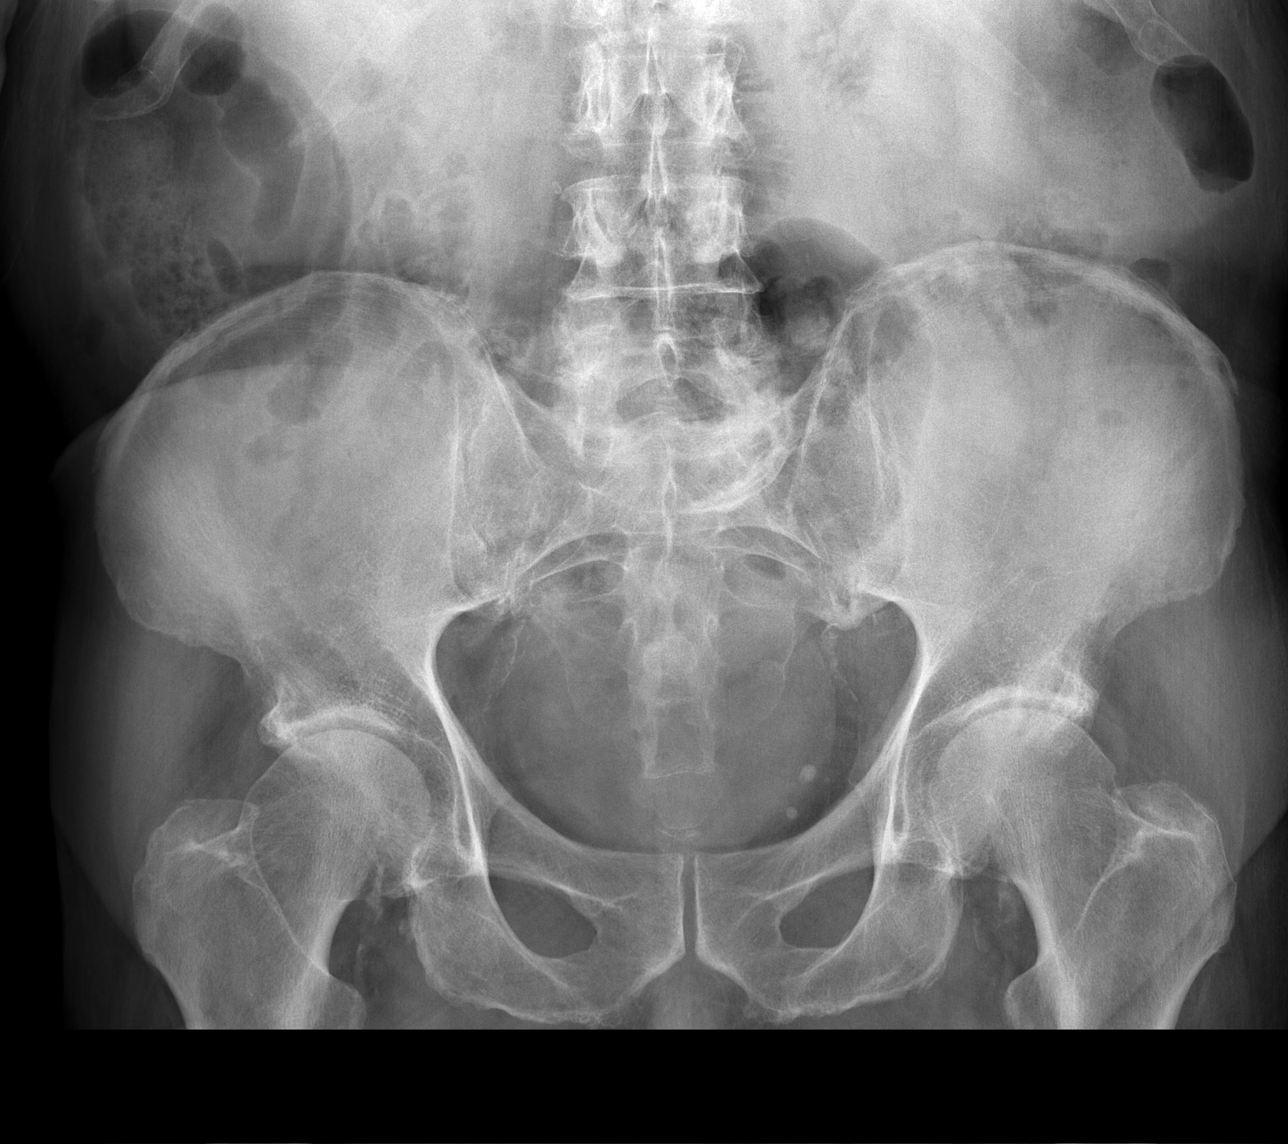

[3 of 3 positions shown; findings below may reference images not displayed]

FINDINGS: Bowel gas pattern is nonobstructed. A radiopaque tablet overlies the
epigastric region. Surgical clips are present in the LEFT UPPER
QUADRANT and UPPER central abdomen. There is no evidence for
organomegaly. Small phleboliths are identified within the pelvis.
Heart is enlarged.
IMPRESSION: Nonobstructive bowel gas pattern. Postsurgical changes.

## 2021-05-07 DIAGNOSIS — M25512 Pain in left shoulder: Secondary | ICD-10-CM | POA: Diagnosis not present

## 2021-05-07 DIAGNOSIS — Z9181 History of falling: Secondary | ICD-10-CM | POA: Diagnosis not present

## 2021-05-07 DIAGNOSIS — E860 Dehydration: Secondary | ICD-10-CM | POA: Diagnosis not present

## 2021-05-07 DIAGNOSIS — R5383 Other fatigue: Secondary | ICD-10-CM | POA: Diagnosis not present

## 2021-05-08 DIAGNOSIS — Z7689 Persons encountering health services in other specified circumstances: Secondary | ICD-10-CM | POA: Diagnosis not present

## 2021-05-08 DIAGNOSIS — E860 Dehydration: Secondary | ICD-10-CM | POA: Diagnosis not present

## 2021-05-08 DIAGNOSIS — M25512 Pain in left shoulder: Secondary | ICD-10-CM | POA: Diagnosis not present

## 2021-05-08 DIAGNOSIS — Z9181 History of falling: Secondary | ICD-10-CM | POA: Diagnosis not present

## 2021-05-08 DIAGNOSIS — G8911 Acute pain due to trauma: Secondary | ICD-10-CM | POA: Diagnosis not present

## 2021-05-08 DIAGNOSIS — M25312 Other instability, left shoulder: Secondary | ICD-10-CM | POA: Diagnosis not present

## 2021-05-09 DIAGNOSIS — M25512 Pain in left shoulder: Secondary | ICD-10-CM | POA: Diagnosis not present

## 2021-05-09 DIAGNOSIS — S42002A Fracture of unspecified part of left clavicle, initial encounter for closed fracture: Secondary | ICD-10-CM | POA: Diagnosis not present

## 2021-05-09 DIAGNOSIS — Z7689 Persons encountering health services in other specified circumstances: Secondary | ICD-10-CM | POA: Diagnosis not present

## 2021-05-09 DIAGNOSIS — M25312 Other instability, left shoulder: Secondary | ICD-10-CM | POA: Diagnosis not present

## 2021-05-10 DIAGNOSIS — E860 Dehydration: Secondary | ICD-10-CM | POA: Diagnosis not present

## 2021-05-10 DIAGNOSIS — M25312 Other instability, left shoulder: Secondary | ICD-10-CM | POA: Diagnosis not present

## 2021-05-11 DIAGNOSIS — M25312 Other instability, left shoulder: Secondary | ICD-10-CM | POA: Diagnosis not present

## 2021-05-12 DIAGNOSIS — M25312 Other instability, left shoulder: Secondary | ICD-10-CM | POA: Diagnosis not present

## 2021-05-14 DIAGNOSIS — M25312 Other instability, left shoulder: Secondary | ICD-10-CM | POA: Diagnosis not present

## 2021-05-15 DIAGNOSIS — M25312 Other instability, left shoulder: Secondary | ICD-10-CM | POA: Diagnosis not present

## 2021-05-16 DIAGNOSIS — M25312 Other instability, left shoulder: Secondary | ICD-10-CM | POA: Diagnosis not present

## 2021-05-17 DIAGNOSIS — E119 Type 2 diabetes mellitus without complications: Secondary | ICD-10-CM | POA: Diagnosis not present

## 2021-05-17 DIAGNOSIS — I4891 Unspecified atrial fibrillation: Secondary | ICD-10-CM | POA: Diagnosis not present

## 2021-05-17 DIAGNOSIS — M6281 Muscle weakness (generalized): Secondary | ICD-10-CM | POA: Diagnosis not present

## 2021-05-17 DIAGNOSIS — G9009 Other idiopathic peripheral autonomic neuropathy: Secondary | ICD-10-CM | POA: Diagnosis not present

## 2021-05-17 DIAGNOSIS — R2689 Other abnormalities of gait and mobility: Secondary | ICD-10-CM | POA: Diagnosis not present

## 2021-05-17 DIAGNOSIS — R279 Unspecified lack of coordination: Secondary | ICD-10-CM | POA: Diagnosis not present

## 2021-05-18 DIAGNOSIS — I4891 Unspecified atrial fibrillation: Secondary | ICD-10-CM | POA: Diagnosis not present

## 2021-05-18 DIAGNOSIS — G9009 Other idiopathic peripheral autonomic neuropathy: Secondary | ICD-10-CM | POA: Diagnosis not present

## 2021-05-18 DIAGNOSIS — R279 Unspecified lack of coordination: Secondary | ICD-10-CM | POA: Diagnosis not present

## 2021-05-18 DIAGNOSIS — M6281 Muscle weakness (generalized): Secondary | ICD-10-CM | POA: Diagnosis not present

## 2021-05-18 DIAGNOSIS — R2689 Other abnormalities of gait and mobility: Secondary | ICD-10-CM | POA: Diagnosis not present

## 2021-05-18 DIAGNOSIS — E119 Type 2 diabetes mellitus without complications: Secondary | ICD-10-CM | POA: Diagnosis not present

## 2021-05-21 DIAGNOSIS — I4891 Unspecified atrial fibrillation: Secondary | ICD-10-CM | POA: Diagnosis not present

## 2021-05-21 DIAGNOSIS — E119 Type 2 diabetes mellitus without complications: Secondary | ICD-10-CM | POA: Diagnosis not present

## 2021-05-21 DIAGNOSIS — R2689 Other abnormalities of gait and mobility: Secondary | ICD-10-CM | POA: Diagnosis not present

## 2021-05-21 DIAGNOSIS — M6281 Muscle weakness (generalized): Secondary | ICD-10-CM | POA: Diagnosis not present

## 2021-05-21 DIAGNOSIS — G9009 Other idiopathic peripheral autonomic neuropathy: Secondary | ICD-10-CM | POA: Diagnosis not present

## 2021-05-21 DIAGNOSIS — R279 Unspecified lack of coordination: Secondary | ICD-10-CM | POA: Diagnosis not present

## 2021-05-22 DIAGNOSIS — G9009 Other idiopathic peripheral autonomic neuropathy: Secondary | ICD-10-CM | POA: Diagnosis not present

## 2021-05-22 DIAGNOSIS — I4891 Unspecified atrial fibrillation: Secondary | ICD-10-CM | POA: Diagnosis not present

## 2021-05-22 DIAGNOSIS — E119 Type 2 diabetes mellitus without complications: Secondary | ICD-10-CM | POA: Diagnosis not present

## 2021-05-22 DIAGNOSIS — M6281 Muscle weakness (generalized): Secondary | ICD-10-CM | POA: Diagnosis not present

## 2021-05-22 DIAGNOSIS — R2689 Other abnormalities of gait and mobility: Secondary | ICD-10-CM | POA: Diagnosis not present

## 2021-05-22 DIAGNOSIS — R279 Unspecified lack of coordination: Secondary | ICD-10-CM | POA: Diagnosis not present

## 2021-05-25 DIAGNOSIS — I129 Hypertensive chronic kidney disease with stage 1 through stage 4 chronic kidney disease, or unspecified chronic kidney disease: Secondary | ICD-10-CM | POA: Diagnosis not present

## 2021-05-25 DIAGNOSIS — N1832 Chronic kidney disease, stage 3b: Secondary | ICD-10-CM | POA: Diagnosis not present

## 2021-05-25 DIAGNOSIS — I4821 Permanent atrial fibrillation: Secondary | ICD-10-CM | POA: Diagnosis not present

## 2021-05-25 DIAGNOSIS — D6869 Other thrombophilia: Secondary | ICD-10-CM | POA: Diagnosis not present

## 2021-05-30 DIAGNOSIS — E1121 Type 2 diabetes mellitus with diabetic nephropathy: Secondary | ICD-10-CM | POA: Diagnosis not present

## 2021-05-30 DIAGNOSIS — E7800 Pure hypercholesterolemia, unspecified: Secondary | ICD-10-CM | POA: Diagnosis not present

## 2021-05-30 DIAGNOSIS — N1832 Chronic kidney disease, stage 3b: Secondary | ICD-10-CM | POA: Diagnosis not present

## 2021-05-30 DIAGNOSIS — I129 Hypertensive chronic kidney disease with stage 1 through stage 4 chronic kidney disease, or unspecified chronic kidney disease: Secondary | ICD-10-CM | POA: Diagnosis not present

## 2021-05-31 DIAGNOSIS — D1801 Hemangioma of skin and subcutaneous tissue: Secondary | ICD-10-CM | POA: Diagnosis not present

## 2021-05-31 DIAGNOSIS — L821 Other seborrheic keratosis: Secondary | ICD-10-CM | POA: Diagnosis not present

## 2021-05-31 DIAGNOSIS — L820 Inflamed seborrheic keratosis: Secondary | ICD-10-CM | POA: Diagnosis not present

## 2021-05-31 DIAGNOSIS — Z85828 Personal history of other malignant neoplasm of skin: Secondary | ICD-10-CM | POA: Diagnosis not present

## 2021-05-31 DIAGNOSIS — D044 Carcinoma in situ of skin of scalp and neck: Secondary | ICD-10-CM | POA: Diagnosis not present
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
## 2021-07-12 ENCOUNTER — Ambulatory Visit: Payer: Medicare Other | Admitting: Adult Health

## 2021-07-12 ENCOUNTER — Other Ambulatory Visit: Payer: Self-pay

## 2021-07-12 ENCOUNTER — Encounter: Payer: Self-pay | Admitting: Adult Health

## 2021-07-12 VITALS — BP 110/60 | HR 60 | Ht 70.0 in | Wt 210.2 lb

## 2021-07-12 DIAGNOSIS — G4733 Obstructive sleep apnea (adult) (pediatric): Secondary | ICD-10-CM

## 2021-07-12 DIAGNOSIS — Z9989 Dependence on other enabling machines and devices: Secondary | ICD-10-CM

## 2021-07-12 NOTE — Patient Instructions (Signed)
Continue using CPAP nightly and greater than 4 hours each night °If your symptoms worsen or you develop new symptoms please let us know.  ° °
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
## 2021-07-13 ENCOUNTER — Other Ambulatory Visit: Payer: Self-pay

## 2021-07-13 ENCOUNTER — Encounter: Payer: Self-pay | Admitting: Cardiology

## 2021-07-13 ENCOUNTER — Ambulatory Visit: Payer: Medicare Other | Admitting: Cardiology

## 2021-07-13 VITALS — BP 142/60 | HR 61 | Resp 20 | Ht 70.0 in | Wt 210.0 lb

## 2021-07-13 DIAGNOSIS — I5032 Chronic diastolic (congestive) heart failure: Secondary | ICD-10-CM | POA: Diagnosis not present

## 2021-07-13 DIAGNOSIS — N183 Chronic kidney disease, stage 3 unspecified: Secondary | ICD-10-CM

## 2021-07-13 DIAGNOSIS — I351 Nonrheumatic aortic (valve) insufficiency: Secondary | ICD-10-CM | POA: Diagnosis not present

## 2021-07-13 DIAGNOSIS — I482 Chronic atrial fibrillation, unspecified: Secondary | ICD-10-CM

## 2021-07-13 DIAGNOSIS — R0602 Shortness of breath: Secondary | ICD-10-CM

## 2021-07-13 MED ORDER — DOXAZOSIN MESYLATE 2 MG PO TABS: 2.0000 mg | ORAL_TABLET | Freq: Every day | ORAL | 3 refills | Status: DC

## 2021-07-13 MED ORDER — EMPAGLIFLOZIN 10 MG PO TABS: 10.0000 mg | ORAL_TABLET | Freq: Every day | ORAL | 3 refills | Status: AC

## 2021-07-13 NOTE — Patient Instructions (Signed)
If affordable I would like to start Jardiance 10 mg daily. We could then reduce lasix to 40 mg daily  Continue your other therapy

## 2021-08-10 ENCOUNTER — Encounter: Payer: Self-pay | Admitting: Cardiology

## 2021-08-10 NOTE — Telephone Encounter (Signed)
I would take 80 mg daily for next 4 days until swelling better.  Shellye Zandi Martinique MD, [facility]

## 2021-08-13 ENCOUNTER — Other Ambulatory Visit: Payer: Self-pay

## 2021-08-13 MED ORDER — ELIQUIS 2.5 MG PO TABS: 2.5000 mg | ORAL_TABLET | Freq: Two times a day (BID) | ORAL | 3 refills | Status: AC

## 2021-08-13 MED ORDER — FUROSEMIDE 80 MG PO TABS: 80.0000 mg | ORAL_TABLET | Freq: Every day | ORAL | 3 refills | Status: AC

## 2021-08-13 MED ORDER — DOXAZOSIN MESYLATE 2 MG PO TABS: 2.0000 mg | ORAL_TABLET | Freq: Every day | ORAL | 3 refills | Status: AC

## 2021-08-17 DIAGNOSIS — Z4420 Encounter for fitting and adjustment of artificial eye, unspecified: Secondary | ICD-10-CM | POA: Diagnosis not present

## 2021-08-31 DIAGNOSIS — I4821 Permanent atrial fibrillation: Secondary | ICD-10-CM | POA: Diagnosis not present

## 2021-08-31 DIAGNOSIS — D6869 Other thrombophilia: Secondary | ICD-10-CM | POA: Diagnosis not present

## 2021-08-31 DIAGNOSIS — I129 Hypertensive chronic kidney disease with stage 1 through stage 4 chronic kidney disease, or unspecified chronic kidney disease: Secondary | ICD-10-CM | POA: Diagnosis not present

## 2021-08-31 DIAGNOSIS — N1832 Chronic kidney disease, stage 3b: Secondary | ICD-10-CM | POA: Diagnosis not present

## 2021-08-31 DIAGNOSIS — E1169 Type 2 diabetes mellitus with other specified complication: Secondary | ICD-10-CM | POA: Diagnosis not present

## 2021-09-17 ENCOUNTER — Inpatient Hospital Stay (HOSPITAL_COMMUNITY): Payer: Medicare Other

## 2021-09-17 ENCOUNTER — Encounter (HOSPITAL_COMMUNITY): Payer: Self-pay | Admitting: Neurological Surgery

## 2021-09-17 ENCOUNTER — Emergency Department (HOSPITAL_COMMUNITY): Payer: Medicare Other

## 2021-09-17 ENCOUNTER — Inpatient Hospital Stay (HOSPITAL_COMMUNITY)
Admission: EM | Admit: 2021-09-17 | Discharge: 2021-10-15 | DRG: 052 | Disposition: E | Payer: Medicare Other | Attending: General Surgery | Admitting: General Surgery

## 2021-09-17 ENCOUNTER — Other Ambulatory Visit: Payer: Self-pay

## 2021-09-17 DIAGNOSIS — I4821 Permanent atrial fibrillation: Secondary | ICD-10-CM | POA: Diagnosis not present

## 2021-09-17 DIAGNOSIS — S2232XA Fracture of one rib, left side, initial encounter for closed fracture: Secondary | ICD-10-CM | POA: Diagnosis not present

## 2021-09-17 DIAGNOSIS — S12300A Unspecified displaced fracture of fourth cervical vertebra, initial encounter for closed fracture: Secondary | ICD-10-CM | POA: Diagnosis present

## 2021-09-17 DIAGNOSIS — F03A0 Unspecified dementia, mild, without behavioral disturbance, psychotic disturbance, mood disturbance, and anxiety: Secondary | ICD-10-CM | POA: Diagnosis present

## 2021-09-17 DIAGNOSIS — I130 Hypertensive heart and chronic kidney disease with heart failure and stage 1 through stage 4 chronic kidney disease, or unspecified chronic kidney disease: Secondary | ICD-10-CM | POA: Diagnosis present

## 2021-09-17 DIAGNOSIS — Z515 Encounter for palliative care: Secondary | ICD-10-CM | POA: Diagnosis not present

## 2021-09-17 DIAGNOSIS — M542 Cervicalgia: Secondary | ICD-10-CM | POA: Diagnosis not present

## 2021-09-17 DIAGNOSIS — Z7984 Long term (current) use of oral hypoglycemic drugs: Secondary | ICD-10-CM

## 2021-09-17 DIAGNOSIS — F17290 Nicotine dependence, other tobacco product, uncomplicated: Secondary | ICD-10-CM | POA: Diagnosis present

## 2021-09-17 DIAGNOSIS — R29710 NIHSS score 10: Secondary | ICD-10-CM | POA: Diagnosis not present

## 2021-09-17 DIAGNOSIS — Z86010 Personal history of colonic polyps: Secondary | ICD-10-CM

## 2021-09-17 DIAGNOSIS — D6832 Hemorrhagic disorder due to extrinsic circulating anticoagulants: Secondary | ICD-10-CM | POA: Diagnosis present

## 2021-09-17 DIAGNOSIS — S129XXA Fracture of neck, unspecified, initial encounter: Secondary | ICD-10-CM | POA: Diagnosis not present

## 2021-09-17 DIAGNOSIS — I5032 Chronic diastolic (congestive) heart failure: Secondary | ICD-10-CM | POA: Diagnosis present

## 2021-09-17 DIAGNOSIS — I6523 Occlusion and stenosis of bilateral carotid arteries: Secondary | ICD-10-CM | POA: Diagnosis not present

## 2021-09-17 DIAGNOSIS — E1140 Type 2 diabetes mellitus with diabetic neuropathy, unspecified: Secondary | ICD-10-CM | POA: Diagnosis present

## 2021-09-17 DIAGNOSIS — Z7189 Other specified counseling: Secondary | ICD-10-CM | POA: Diagnosis not present

## 2021-09-17 DIAGNOSIS — S15191A Other specified injury of right vertebral artery, initial encounter: Secondary | ICD-10-CM | POA: Diagnosis not present

## 2021-09-17 DIAGNOSIS — Z970 Presence of artificial eye: Secondary | ICD-10-CM

## 2021-09-17 DIAGNOSIS — S12500A Unspecified displaced fracture of sixth cervical vertebra, initial encounter for closed fracture: Secondary | ICD-10-CM | POA: Diagnosis present

## 2021-09-17 DIAGNOSIS — R296 Repeated falls: Secondary | ICD-10-CM | POA: Diagnosis present

## 2021-09-17 DIAGNOSIS — E1122 Type 2 diabetes mellitus with diabetic chronic kidney disease: Secondary | ICD-10-CM | POA: Diagnosis present

## 2021-09-17 DIAGNOSIS — Z882 Allergy status to sulfonamides status: Secondary | ICD-10-CM

## 2021-09-17 DIAGNOSIS — Z7901 Long term (current) use of anticoagulants: Secondary | ICD-10-CM | POA: Diagnosis not present

## 2021-09-17 DIAGNOSIS — H518 Other specified disorders of binocular movement: Secondary | ICD-10-CM | POA: Diagnosis not present

## 2021-09-17 DIAGNOSIS — W06XXXA Fall from bed, initial encounter: Secondary | ICD-10-CM | POA: Diagnosis present

## 2021-09-17 DIAGNOSIS — I4891 Unspecified atrial fibrillation: Secondary | ICD-10-CM | POA: Diagnosis not present

## 2021-09-17 DIAGNOSIS — Z781 Physical restraint status: Secondary | ICD-10-CM

## 2021-09-17 DIAGNOSIS — S15101A Unspecified injury of right vertebral artery, initial encounter: Secondary | ICD-10-CM | POA: Diagnosis not present

## 2021-09-17 DIAGNOSIS — S14109A Unspecified injury at unspecified level of cervical spinal cord, initial encounter: Principal | ICD-10-CM | POA: Diagnosis present

## 2021-09-17 DIAGNOSIS — F10139 Alcohol abuse with withdrawal, unspecified: Secondary | ICD-10-CM | POA: Diagnosis not present

## 2021-09-17 DIAGNOSIS — R0989 Other specified symptoms and signs involving the circulatory and respiratory systems: Secondary | ICD-10-CM | POA: Diagnosis not present

## 2021-09-17 DIAGNOSIS — I351 Nonrheumatic aortic (valve) insufficiency: Secondary | ICD-10-CM | POA: Diagnosis present

## 2021-09-17 DIAGNOSIS — Z66 Do not resuscitate: Secondary | ICD-10-CM | POA: Diagnosis not present

## 2021-09-17 DIAGNOSIS — I6522 Occlusion and stenosis of left carotid artery: Secondary | ICD-10-CM | POA: Diagnosis present

## 2021-09-17 DIAGNOSIS — N1832 Chronic kidney disease, stage 3b: Secondary | ICD-10-CM | POA: Diagnosis present

## 2021-09-17 DIAGNOSIS — Z4682 Encounter for fitting and adjustment of non-vascular catheter: Secondary | ICD-10-CM | POA: Diagnosis not present

## 2021-09-17 DIAGNOSIS — S3991XA Unspecified injury of abdomen, initial encounter: Secondary | ICD-10-CM | POA: Diagnosis not present

## 2021-09-17 DIAGNOSIS — S12200A Unspecified displaced fracture of third cervical vertebra, initial encounter for closed fracture: Secondary | ICD-10-CM | POA: Diagnosis present

## 2021-09-17 DIAGNOSIS — J3489 Other specified disorders of nose and nasal sinuses: Secondary | ICD-10-CM | POA: Diagnosis not present

## 2021-09-17 DIAGNOSIS — Z8551 Personal history of malignant neoplasm of bladder: Secondary | ICD-10-CM

## 2021-09-17 DIAGNOSIS — E876 Hypokalemia: Secondary | ICD-10-CM | POA: Diagnosis present

## 2021-09-17 DIAGNOSIS — R079 Chest pain, unspecified: Secondary | ICD-10-CM | POA: Diagnosis not present

## 2021-09-17 DIAGNOSIS — E785 Hyperlipidemia, unspecified: Secondary | ICD-10-CM | POA: Diagnosis present

## 2021-09-17 DIAGNOSIS — S065X9A Traumatic subdural hemorrhage with loss of consciousness of unspecified duration, initial encounter: Secondary | ICD-10-CM | POA: Diagnosis present

## 2021-09-17 DIAGNOSIS — I63111 Cerebral infarction due to embolism of right vertebral artery: Secondary | ICD-10-CM | POA: Diagnosis not present

## 2021-09-17 DIAGNOSIS — I639 Cerebral infarction, unspecified: Secondary | ICD-10-CM | POA: Diagnosis not present

## 2021-09-17 DIAGNOSIS — I517 Cardiomegaly: Secondary | ICD-10-CM | POA: Diagnosis not present

## 2021-09-17 DIAGNOSIS — S12400A Unspecified displaced fracture of fifth cervical vertebra, initial encounter for closed fracture: Secondary | ICD-10-CM | POA: Diagnosis present

## 2021-09-17 DIAGNOSIS — S22029A Unspecified fracture of second thoracic vertebra, initial encounter for closed fracture: Secondary | ICD-10-CM | POA: Diagnosis present

## 2021-09-17 DIAGNOSIS — R4182 Altered mental status, unspecified: Secondary | ICD-10-CM | POA: Diagnosis not present

## 2021-09-17 DIAGNOSIS — S22020A Wedge compression fracture of second thoracic vertebra, initial encounter for closed fracture: Secondary | ICD-10-CM

## 2021-09-17 DIAGNOSIS — I6389 Other cerebral infarction: Secondary | ICD-10-CM | POA: Diagnosis not present

## 2021-09-17 DIAGNOSIS — Y92003 Bedroom of unspecified non-institutional (private) residence as the place of occurrence of the external cause: Secondary | ICD-10-CM

## 2021-09-17 DIAGNOSIS — I6521 Occlusion and stenosis of right carotid artery: Secondary | ICD-10-CM | POA: Diagnosis not present

## 2021-09-17 DIAGNOSIS — T45515A Adverse effect of anticoagulants, initial encounter: Secondary | ICD-10-CM | POA: Diagnosis present

## 2021-09-17 DIAGNOSIS — G319 Degenerative disease of nervous system, unspecified: Secondary | ICD-10-CM | POA: Diagnosis not present

## 2021-09-17 DIAGNOSIS — K219 Gastro-esophageal reflux disease without esophagitis: Secondary | ICD-10-CM | POA: Diagnosis present

## 2021-09-17 DIAGNOSIS — S0081XA Abrasion of other part of head, initial encounter: Secondary | ICD-10-CM | POA: Diagnosis present

## 2021-09-17 DIAGNOSIS — Z888 Allergy status to other drugs, medicaments and biological substances status: Secondary | ICD-10-CM

## 2021-09-17 DIAGNOSIS — M8580 Other specified disorders of bone density and structure, unspecified site: Secondary | ICD-10-CM | POA: Diagnosis present

## 2021-09-17 DIAGNOSIS — S61411A Laceration without foreign body of right hand, initial encounter: Secondary | ICD-10-CM | POA: Diagnosis present

## 2021-09-17 DIAGNOSIS — G4733 Obstructive sleep apnea (adult) (pediatric): Secondary | ICD-10-CM | POA: Diagnosis present

## 2021-09-17 DIAGNOSIS — W19XXXA Unspecified fall, initial encounter: Principal | ICD-10-CM

## 2021-09-17 DIAGNOSIS — R4701 Aphasia: Secondary | ICD-10-CM | POA: Diagnosis not present

## 2021-09-17 DIAGNOSIS — S12600A Unspecified displaced fracture of seventh cervical vertebra, initial encounter for closed fracture: Secondary | ICD-10-CM | POA: Diagnosis present

## 2021-09-17 DIAGNOSIS — I672 Cerebral atherosclerosis: Secondary | ICD-10-CM | POA: Diagnosis not present

## 2021-09-17 DIAGNOSIS — I700 Atherosclerosis of aorta: Secondary | ICD-10-CM | POA: Diagnosis not present

## 2021-09-17 DIAGNOSIS — I63211 Cerebral infarction due to unspecified occlusion or stenosis of right vertebral arteries: Secondary | ICD-10-CM | POA: Diagnosis not present

## 2021-09-17 DIAGNOSIS — R29818 Other symptoms and signs involving the nervous system: Secondary | ICD-10-CM | POA: Diagnosis not present

## 2021-09-17 DIAGNOSIS — G8194 Hemiplegia, unspecified affecting left nondominant side: Secondary | ICD-10-CM | POA: Diagnosis present

## 2021-09-17 DIAGNOSIS — Z8249 Family history of ischemic heart disease and other diseases of the circulatory system: Secondary | ICD-10-CM

## 2021-09-17 DIAGNOSIS — S1093XA Contusion of unspecified part of neck, initial encounter: Secondary | ICD-10-CM | POA: Diagnosis not present

## 2021-09-17 DIAGNOSIS — R0902 Hypoxemia: Secondary | ICD-10-CM | POA: Diagnosis not present

## 2021-09-17 DIAGNOSIS — Z79899 Other long term (current) drug therapy: Secondary | ICD-10-CM

## 2021-09-17 DIAGNOSIS — S2222XA Fracture of body of sternum, initial encounter for closed fracture: Secondary | ICD-10-CM | POA: Diagnosis present

## 2021-09-17 DIAGNOSIS — I6782 Cerebral ischemia: Secondary | ICD-10-CM | POA: Diagnosis not present

## 2021-09-17 DIAGNOSIS — S0990XA Unspecified injury of head, initial encounter: Secondary | ICD-10-CM | POA: Diagnosis not present

## 2021-09-17 DIAGNOSIS — I651 Occlusion and stenosis of basilar artery: Secondary | ICD-10-CM | POA: Diagnosis not present

## 2021-09-17 DIAGNOSIS — I10 Essential (primary) hypertension: Secondary | ICD-10-CM | POA: Diagnosis not present

## 2021-09-17 DIAGNOSIS — S2242XA Multiple fractures of ribs, left side, initial encounter for closed fracture: Secondary | ICD-10-CM | POA: Diagnosis not present

## 2021-09-17 LAB — URINALYSIS, ROUTINE W REFLEX MICROSCOPIC
Bilirubin Urine: NEGATIVE
Glucose, UA: >=500 mg/dL — AB
Ketones, ur: 5 mg/dL — AB
Leukocytes,Ua: NEGATIVE
Nitrite: NEGATIVE
Protein, ur: 100 mg/dL — AB
Specific Gravity, Urine: 1.016 (ref 1.005–1.030)
pH: 7 (ref 5.0–8.0)

## 2021-09-17 LAB — CBC
HCT: 48.6 % (ref 39.0–52.0)
Hemoglobin: 16.7 g/dL (ref 13.0–17.0)
MCH: 27.4 pg (ref 26.0–34.0)
MCHC: 34.4 g/dL (ref 30.0–36.0)
MCV: 79.7 fL — ABNORMAL LOW (ref 80.0–100.0)
Platelets: 145 10*3/uL — ABNORMAL LOW (ref 150–400)
RBC: 6.1 MIL/uL — ABNORMAL HIGH (ref 4.22–5.81)
RDW: 14.4 % (ref 11.5–15.5)
WBC: 14.4 10*3/uL — ABNORMAL HIGH (ref 4.0–10.5)
nRBC: 0 % (ref 0.0–0.2)

## 2021-09-17 LAB — BASIC METABOLIC PANEL
Anion gap: 14 (ref 5–15)
BUN: 20 mg/dL (ref 8–23)
CO2: 21 mmol/L — ABNORMAL LOW (ref 22–32)
Calcium: 9.3 mg/dL (ref 8.9–10.3)
Chloride: 102 mmol/L (ref 98–111)
Creatinine, Ser: 1.73 mg/dL — ABNORMAL HIGH (ref 0.61–1.24)
GFR, Estimated: 38 mL/min — ABNORMAL LOW (ref >60)
Glucose, Bld: 230 mg/dL — ABNORMAL HIGH (ref 70–99)
Potassium: 3.1 mmol/L — ABNORMAL LOW (ref 3.5–5.1)
Sodium: 137 mmol/L (ref 135–145)

## 2021-09-17 LAB — MRSA NEXT GEN BY PCR, NASAL: MRSA by PCR Next Gen: NOT DETECTED

## 2021-09-17 LAB — GLUCOSE, CAPILLARY: Glucose-Capillary: 162 mg/dL — ABNORMAL HIGH (ref 70–99)

## 2021-09-17 LAB — CBG MONITORING, ED: Glucose-Capillary: 188 mg/dL — ABNORMAL HIGH (ref 70–99)

## 2021-09-17 MED ORDER — POTASSIUM CHLORIDE 10 MEQ/100ML IV SOLN
10.0000 meq | INTRAVENOUS | Status: AC
  Administered 2021-09-17: 10 meq via INTRAVENOUS

## 2021-09-17 MED ORDER — FENTANYL CITRATE PF 50 MCG/ML IJ SOSY
50.0000 ug | PREFILLED_SYRINGE | Freq: Once | INTRAMUSCULAR | Status: AC
  Administered 2021-09-17: 50 ug via INTRAVENOUS
  Filled 2021-09-17: qty 1

## 2021-09-17 MED ORDER — PANTOPRAZOLE SODIUM 40 MG PO TBEC
40.0000 mg | DELAYED_RELEASE_TABLET | Freq: Every day | ORAL | Status: DC
  Filled 2021-09-17: qty 1

## 2021-09-17 MED ORDER — POTASSIUM CHLORIDE IN NACL 20-0.9 MEQ/L-% IV SOLN
INTRAVENOUS | Status: DC
  Filled 2021-09-17: qty 1000

## 2021-09-17 MED ORDER — DOXAZOSIN MESYLATE 2 MG PO TABS
2.0000 mg | ORAL_TABLET | Freq: Every day | ORAL | Status: DC
Start: 2021-09-17 — End: 2021-09-18
  Administered 2021-09-17: 2 mg via ORAL
  Filled 2021-09-17 (×2): qty 1

## 2021-09-17 MED ORDER — PANTOPRAZOLE SODIUM 40 MG IV SOLR
40.0000 mg | Freq: Every day | INTRAVENOUS | Status: DC
  Administered 2021-09-19: 40 mg via INTRAVENOUS
  Filled 2021-09-17: qty 10

## 2021-09-17 MED ORDER — IOHEXOL 350 MG/ML SOLN
100.0000 mL | Freq: Once | INTRAVENOUS | Status: AC | PRN
  Administered 2021-09-17: 100 mL via INTRAVENOUS

## 2021-09-17 MED ORDER — PROTHROMBIN COMPLEX CONC HUMAN 500 UNITS IV KIT: 50.0000 [IU]/kg | PACK | Status: DC

## 2021-09-17 MED ORDER — ONDANSETRON HCL 4 MG/2ML IJ SOLN
4.0000 mg | Freq: Four times a day (QID) | INTRAMUSCULAR | Status: DC | PRN
  Administered 2021-09-17: 4 mg via INTRAVENOUS
  Filled 2021-09-17: qty 2

## 2021-09-17 MED ORDER — AMLODIPINE BESYLATE 10 MG PO TABS
10.0000 mg | ORAL_TABLET | Freq: Every day | ORAL | Status: DC
  Administered 2021-09-17 – 2021-09-19 (×2): 10 mg via ORAL
  Filled 2021-09-17: qty 1
  Filled 2021-09-17: qty 2
  Filled 2021-09-17: qty 1

## 2021-09-17 MED ORDER — TRAMADOL HCL 50 MG PO TABS: 50.0000 mg | ORAL_TABLET | Freq: Four times a day (QID) | ORAL | Status: DC | PRN

## 2021-09-17 MED ORDER — PRAVASTATIN SODIUM 40 MG PO TABS
40.0000 mg | ORAL_TABLET | Freq: Every day | ORAL | Status: DC
  Administered 2021-09-17: 40 mg via ORAL
  Filled 2021-09-17: qty 1

## 2021-09-17 MED ORDER — HYDROMORPHONE HCL 1 MG/ML IJ SOLN
0.5000 mg | Freq: Once | INTRAMUSCULAR | Status: AC
  Administered 2021-09-17: 0.5 mg via INTRAVENOUS
  Filled 2021-09-17: qty 1

## 2021-09-17 MED ORDER — AMLODIPINE BESYLATE 5 MG PO TABS
10.0000 mg | ORAL_TABLET | Freq: Every day | ORAL | Status: DC
  Filled 2021-09-17: qty 2

## 2021-09-17 MED ORDER — ACETAMINOPHEN 325 MG PO TABS
650.0000 mg | ORAL_TABLET | Freq: Four times a day (QID) | ORAL | Status: DC
Start: 2021-09-18 — End: 2021-09-18
  Administered 2021-09-18: 650 mg via ORAL
  Filled 2021-09-17: qty 2

## 2021-09-17 MED ORDER — PANTOPRAZOLE SODIUM 40 MG PO TBEC
40.0000 mg | DELAYED_RELEASE_TABLET | Freq: Every day | ORAL | Status: DC
  Administered 2021-09-17: 40 mg via ORAL
  Filled 2021-09-17: qty 1

## 2021-09-17 MED ORDER — HYDROXYZINE HCL 25 MG PO TABS
50.0000 mg | ORAL_TABLET | Freq: Every day | ORAL | Status: DC
  Administered 2021-09-17: 50 mg via ORAL
  Filled 2021-09-17: qty 2

## 2021-09-17 MED ORDER — METHOCARBAMOL 500 MG PO TABS
500.0000 mg | ORAL_TABLET | Freq: Four times a day (QID) | ORAL | Status: DC | PRN
Start: 2021-09-17 — End: 2021-09-18
  Administered 2021-09-17: 500 mg via ORAL
  Filled 2021-09-17 (×2): qty 1

## 2021-09-17 MED ORDER — FUROSEMIDE 20 MG PO TABS
80.0000 mg | ORAL_TABLET | Freq: Every day | ORAL | Status: DC
  Filled 2021-09-17: qty 4

## 2021-09-17 MED ORDER — METOPROLOL TARTRATE 5 MG/5ML IV SOLN
5.0000 mg | Freq: Once | INTRAVENOUS | Status: AC
  Administered 2021-09-17: 5 mg via INTRAVENOUS
  Filled 2021-09-17: qty 5

## 2021-09-17 MED ORDER — INSULIN ASPART 100 UNIT/ML IJ SOLN: 0.0000 [IU] | INTRAMUSCULAR | Status: DC

## 2021-09-17 MED ORDER — INSULIN ASPART 100 UNIT/ML IJ SOLN
0.0000 [IU] | INTRAMUSCULAR | Status: DC
  Administered 2021-09-17: 3 [IU] via SUBCUTANEOUS
  Administered 2021-09-18: 5 [IU] via SUBCUTANEOUS
  Administered 2021-09-18 (×4): 3 [IU] via SUBCUTANEOUS
  Administered 2021-09-18: 5 [IU] via SUBCUTANEOUS
  Administered 2021-09-19: 2 [IU] via SUBCUTANEOUS
  Administered 2021-09-19: 3 [IU] via SUBCUTANEOUS
  Administered 2021-09-19: 2 [IU] via SUBCUTANEOUS
  Administered 2021-09-19 (×2): 3 [IU] via SUBCUTANEOUS
  Administered 2021-09-19: 2 [IU] via SUBCUTANEOUS
  Administered 2021-09-20: 5 [IU] via SUBCUTANEOUS
  Administered 2021-09-20: 2 [IU] via SUBCUTANEOUS
  Administered 2021-09-20 (×2): 5 [IU] via SUBCUTANEOUS
  Administered 2021-09-20: 2 [IU] via SUBCUTANEOUS
  Administered 2021-09-20: 3 [IU] via SUBCUTANEOUS
  Administered 2021-09-21: 5 [IU] via SUBCUTANEOUS
  Administered 2021-09-21: 8 [IU] via SUBCUTANEOUS
  Administered 2021-09-21: 5 [IU] via SUBCUTANEOUS
  Administered 2021-09-21: 8 [IU] via SUBCUTANEOUS

## 2021-09-17 MED ORDER — GABAPENTIN 300 MG PO CAPS
900.0000 mg | ORAL_CAPSULE | Freq: Every day | ORAL | Status: DC
  Administered 2021-09-17: 900 mg via ORAL
  Filled 2021-09-17: qty 3

## 2021-09-17 MED ORDER — ACETAMINOPHEN 325 MG PO TABS
650.0000 mg | ORAL_TABLET | Freq: Four times a day (QID) | ORAL | Status: DC
Start: 2021-09-17 — End: 2021-09-17
  Administered 2021-09-17 (×2): 650 mg via ORAL
  Filled 2021-09-17 (×3): qty 2

## 2021-09-17 MED ORDER — FUROSEMIDE 40 MG PO TABS
80.0000 mg | ORAL_TABLET | Freq: Every day | ORAL | Status: DC
  Administered 2021-09-17: 80 mg via ORAL
  Filled 2021-09-17: qty 4

## 2021-09-17 MED ORDER — OXYCODONE HCL 5 MG PO TABS
5.0000 mg | ORAL_TABLET | ORAL | Status: DC
  Administered 2021-09-17 (×2): 5 mg via ORAL
  Filled 2021-09-17 (×2): qty 1

## 2021-09-17 MED ORDER — EMPTY CONTAINERS FLEXIBLE MISC
900.0000 mg | Freq: Once | Status: AC
  Administered 2021-09-17: 900 mg via INTRAVENOUS
  Filled 2021-09-17: qty 90

## 2021-09-17 MED ORDER — MORPHINE SULFATE (PF) 4 MG/ML IV SOLN
4.0000 mg | INTRAVENOUS | Status: DC | PRN
  Administered 2021-09-17 – 2021-09-18 (×5): 4 mg via INTRAVENOUS
  Filled 2021-09-17 (×6): qty 1

## 2021-09-17 MED ORDER — ONDANSETRON 4 MG PO TBDP: 4.0000 mg | ORAL_TABLET | Freq: Four times a day (QID) | ORAL | Status: DC | PRN

## 2021-09-17 MED ORDER — POTASSIUM CHLORIDE 10 MEQ/100ML IV SOLN
10.0000 meq | INTRAVENOUS | Status: DC
  Filled 2021-09-17: qty 100

## 2021-09-17 NOTE — ED Notes (Signed)
Pt still in [place]. ?
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
## 2021-09-17 NOTE — Progress Notes (Signed)
Paged on call, Dr Grandville Silos about pt bp 202/87 (118) and HR 83.  Give '5mg'$  Lopressor IV times one dose.  Will continue to monitor. ?
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
## 2021-09-17 NOTE — ED Notes (Signed)
[place] J placed 
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
## 2021-09-17 NOTE — ED Notes (Signed)
Trauma at the bedside.

## 2021-09-17 NOTE — ED Notes (Signed)
Patient transported to MRI 

## 2021-09-17 NOTE — ED Notes (Signed)
Pt activated as a level 2 ?

## 2021-09-17 NOTE — ED Triage Notes (Signed)
Pt via EMS from independent living at the [facility], found on floor beside the bed by wife this morning. Pt on Eliquis. Pt A/O x 4, although does not know when he fell. C/o neck pain-c collar in place by EMS. Frequent falls recently per family.  ?

## 2021-09-17 NOTE — Progress Notes (Signed)
Patient has a prostetic eye in his left eye socket; pt has bilateral hearing aids but does not want to wear them so daughter, Vaughan Basta has them in her possession. ?

## 2021-09-17 NOTE — Progress Notes (Signed)
Orthopedic Tech Progress Note ?Patient Details:  ?Anthony Ho ?04-Jun-1932 ?[id number] ? ?Level 2 trauma  ? ?Patient ID: Anthony Ho, male   DOB: Mar 01, 1932, 88 y.o.   MRN: [id number] ? ?[name] ?09/29/2021, 9:37 AM ? ?

## 2021-09-17 NOTE — Progress Notes (Signed)
The MRI of the cervical spine was reviewed.  The radiologist says there is subdural hemorrhage from the foramen magnum down to T1 with effacement of the subarachnoid space but no cord compression and the cord looks okay.  I suspect this may actually be epidural. ? ?I do not see anything that requires acute surgical intervention at this time.  In fact, I do not believe he is a surgical candidate given his advanced age and the length of the hemorrhage and his use of oral anticoagulants.  If he were to become quadriplegic I would likely recommend comfort care other than surgical intervention.  I will talk to the family. ? ?Would consider reversing his Eliquis to decrease the risk of further expansion of the hematoma. ? ?Otherwise I would continue to treat the fractures in a collar and hope for healing. ? ?I hope that he will remain neurologically intact and that this hematoma will resolve over time. ?

## 2021-09-17 NOTE — ED Notes (Signed)
,  pt first attempt unsuccessful, pt has label specimen cup will try soon ?
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
## 2021-09-17 NOTE — ED Notes (Signed)
Neurosurgery at the bedside.

## 2021-09-17 NOTE — TOC CAGE-AID Note (Signed)
Transition of Care (TOC) - CAGE-AID Screening ? ? ?Patient Details  ?Name: Anthony Ho ?MRN: [id number] ?Date of Birth: [birth year] ? ?Transition of Care (TOC) CM/SW Contact:    ?Lajuane Leatham C Tarpley-Carter, LCSWA ?Phone Number: ?10/02/2021, 2:04 PM ? ? ?Clinical Narrative: ?Pt participated in [place].  Pt stated he does use drink ETOH.  Pt was offered resources, due to usage of ETOH.  ? ?Passenger transport manager, MSW, LCSW-A ?Pronouns:  She/Her/Hers ?[facility] ?Clinical Social Worker ?Direct Number:  [phone] ?[email or web address]   ? ?CAGE-AID Screening: ?  ? ?Have You Ever Felt You Ought to Cut Down on Your Drinking or Drug Use?: No ?Have People Annoyed You By Critizing Your Drinking Or Drug Use?: No ?Have You Felt Bad Or Guilty About Your Drinking Or Drug Use?: No ?Have You Ever Had a Drink or Used Drugs First Thing In The Morning to Steady Your Nerves or to Get Rid of a Hangover?: No ?CAGE-AID Score: 0 ? ?Substance Abuse Education Offered: Yes ? ?Substance abuse interventions: Educational Materials ? ? ? ? ? ? ?

## 2021-09-17 NOTE — Progress Notes (Signed)
RN called back into the room by daughter, Vaughan Basta, after apply mittens on patient due to pulling off [place] J collar and telemetry leads.  IV site is wrapped with gauze.  Pt was kicking and repeatedly trying to get out of bed, pulling collar up over his chin and coughing.  Daughter at bedside trying to redirect the patient but the patient has dementia at baseline and is sundowning.  Daughter asked the nurse to place the patient in restraints, RN spoke with Dr. Grandville Silos, on call, and he agreed with family.  Pt was put in bilateral wrists, ankles, and posey belt by RN and charge nurse.  Daughter remains at bedside with patient. ?

## 2021-09-18 ENCOUNTER — Inpatient Hospital Stay (HOSPITAL_COMMUNITY): Payer: Medicare Other

## 2021-09-18 DIAGNOSIS — I639 Cerebral infarction, unspecified: Secondary | ICD-10-CM | POA: Diagnosis not present

## 2021-09-18 LAB — CBC
HCT: 44.9 % (ref 39.0–52.0)
Hemoglobin: 14.8 g/dL (ref 13.0–17.0)
MCH: 27.5 pg (ref 26.0–34.0)
MCHC: 33 g/dL (ref 30.0–36.0)
MCV: 83.3 fL (ref 80.0–100.0)
Platelets: 110 10*3/uL — ABNORMAL LOW (ref 150–400)
RBC: 5.39 MIL/uL (ref 4.22–5.81)
RDW: 14.9 % (ref 11.5–15.5)
WBC: 13 10*3/uL — ABNORMAL HIGH (ref 4.0–10.5)
nRBC: 0 % (ref 0.0–0.2)

## 2021-09-18 LAB — GLUCOSE, CAPILLARY
Glucose-Capillary: 131 mg/dL — ABNORMAL HIGH (ref 70–99)
Glucose-Capillary: 167 mg/dL — ABNORMAL HIGH (ref 70–99)
Glucose-Capillary: 173 mg/dL — ABNORMAL HIGH (ref 70–99)
Glucose-Capillary: 175 mg/dL — ABNORMAL HIGH (ref 70–99)
Glucose-Capillary: 179 mg/dL — ABNORMAL HIGH (ref 70–99)
Glucose-Capillary: 204 mg/dL — ABNORMAL HIGH (ref 70–99)
Glucose-Capillary: 217 mg/dL — ABNORMAL HIGH (ref 70–99)

## 2021-09-18 LAB — BLOOD GAS, ARTERIAL
Acid-Base Excess: 0.3 mmol/L (ref 0.0–2.0)
Bicarbonate: 24.6 mmol/L (ref 20.0–28.0)
Drawn by: 331761
O2 Saturation: 91.2 %
Patient temperature: 36.9
pCO2 arterial: 38 mmHg (ref 32–48)
pH, Arterial: 7.42 (ref 7.35–7.45)
pO2, Arterial: 59 mmHg — ABNORMAL LOW (ref 83–108)

## 2021-09-18 LAB — BASIC METABOLIC PANEL
Anion gap: 9 (ref 5–15)
BUN: 17 mg/dL (ref 8–23)
CO2: 26 mmol/L (ref 22–32)
Calcium: 8.5 mg/dL — ABNORMAL LOW (ref 8.9–10.3)
Chloride: 106 mmol/L (ref 98–111)
Creatinine, Ser: 1.76 mg/dL — ABNORMAL HIGH (ref 0.61–1.24)
GFR, Estimated: 37 mL/min — ABNORMAL LOW (ref >60)
Glucose, Bld: 152 mg/dL — ABNORMAL HIGH (ref 70–99)
Potassium: 3.9 mmol/L (ref 3.5–5.1)
Sodium: 141 mmol/L (ref 135–145)

## 2021-09-18 MED ORDER — FOLIC ACID 1 MG PO TABS
1.0000 mg | ORAL_TABLET | Freq: Every day | ORAL | Status: DC
  Administered 2021-09-19 – 2021-09-21 (×3): 1 mg
  Filled 2021-09-18 (×3): qty 1

## 2021-09-18 MED ORDER — FOLIC ACID 1 MG PO TABS
1.0000 mg | ORAL_TABLET | Freq: Every day | ORAL | Status: DC
  Filled 2021-09-18: qty 1

## 2021-09-18 MED ORDER — ASPIRIN 300 MG RE SUPP
300.0000 mg | Freq: Every day | RECTAL | Status: DC
  Filled 2021-09-18: qty 1

## 2021-09-18 MED ORDER — DOXAZOSIN MESYLATE 2 MG PO TABS
2.0000 mg | ORAL_TABLET | Freq: Every day | ORAL | Status: DC
  Administered 2021-09-18 – 2021-09-21 (×4): 2 mg
  Filled 2021-09-18 (×5): qty 1

## 2021-09-18 MED ORDER — LORAZEPAM 2 MG/ML IJ SOLN: 0.5000 mg | Freq: Four times a day (QID) | INTRAMUSCULAR | Status: DC | PRN

## 2021-09-18 MED ORDER — ACETAMINOPHEN 325 MG PO TABS
650.0000 mg | ORAL_TABLET | Freq: Four times a day (QID) | ORAL | Status: DC
  Administered 2021-09-18 – 2021-09-21 (×13): 650 mg
  Filled 2021-09-18 (×13): qty 2

## 2021-09-18 MED ORDER — ADULT MULTIVITAMIN W/MINERALS CH
1.0000 | ORAL_TABLET | Freq: Every day | ORAL | Status: DC
  Administered 2021-09-18: 1 via ORAL
  Filled 2021-09-18: qty 1

## 2021-09-18 MED ORDER — THIAMINE HCL 100 MG PO TABS
100.0000 mg | ORAL_TABLET | Freq: Every day | ORAL | Status: DC
  Administered 2021-09-19: 100 mg
  Filled 2021-09-18: qty 1

## 2021-09-18 MED ORDER — STROKE: EARLY STAGES OF RECOVERY BOOK
Freq: Once | Status: AC
  Administered 2021-09-18: 1
  Filled 2021-09-18: qty 1

## 2021-09-18 MED ORDER — FUROSEMIDE 10 MG/ML IJ SOLN
80.0000 mg | Freq: Every day | INTRAMUSCULAR | Status: DC
  Administered 2021-09-19 – 2021-09-21 (×3): 80 mg via INTRAVENOUS
  Filled 2021-09-18 (×4): qty 8

## 2021-09-18 MED ORDER — METHOCARBAMOL 500 MG PO TABS
500.0000 mg | ORAL_TABLET | Freq: Four times a day (QID) | ORAL | Status: DC | PRN
Start: 2021-09-18 — End: 2021-09-22
  Administered 2021-09-20 (×3): 500 mg
  Filled 2021-09-18 (×3): qty 1

## 2021-09-18 MED ORDER — ASPIRIN 325 MG PO TABS
325.0000 mg | ORAL_TABLET | Freq: Every day | ORAL | Status: DC
Start: 2021-09-18 — End: 2021-09-18
  Administered 2021-09-18: 325 mg via ORAL
  Filled 2021-09-18: qty 1

## 2021-09-18 MED ORDER — HYDROXYZINE HCL 25 MG PO TABS
50.0000 mg | ORAL_TABLET | Freq: Every day | ORAL | Status: DC
  Administered 2021-09-18 – 2021-09-19 (×2): 50 mg
  Filled 2021-09-18 (×2): qty 2

## 2021-09-18 MED ORDER — MORPHINE SULFATE (PF) 2 MG/ML IV SOLN
2.0000 mg | INTRAVENOUS | Status: DC | PRN
  Administered 2021-09-18 – 2021-09-21 (×13): 2 mg via INTRAVENOUS
  Filled 2021-09-18 (×13): qty 1

## 2021-09-18 MED ORDER — LORAZEPAM 2 MG/ML IJ SOLN: 1.0000 mg | Freq: Once | INTRAMUSCULAR | Status: DC

## 2021-09-18 MED ORDER — CHLORHEXIDINE GLUCONATE CLOTH 2 % EX PADS
6.0000 | MEDICATED_PAD | Freq: Every day | CUTANEOUS | Status: DC
  Administered 2021-09-19 – 2021-09-20 (×2): 6 via TOPICAL

## 2021-09-18 MED ORDER — LACTATED RINGERS IV SOLN: INTRAVENOUS | Status: DC

## 2021-09-18 MED ORDER — QUETIAPINE FUMARATE 25 MG PO TABS: 25.0000 mg | ORAL_TABLET | Freq: Every day | ORAL | Status: DC

## 2021-09-18 MED ORDER — HALOPERIDOL LACTATE 5 MG/ML IJ SOLN
5.0000 mg | Freq: Four times a day (QID) | INTRAMUSCULAR | Status: DC | PRN
  Filled 2021-09-18: qty 1

## 2021-09-18 MED ORDER — ADULT MULTIVITAMIN W/MINERALS CH
1.0000 | ORAL_TABLET | Freq: Every day | ORAL | Status: DC
  Administered 2021-09-19 – 2021-09-21 (×3): 1
  Filled 2021-09-18 (×4): qty 1

## 2021-09-18 MED ORDER — OXYCODONE HCL 5 MG PO TABS: 5.0000 mg | ORAL_TABLET | ORAL | Status: DC | PRN

## 2021-09-18 MED ORDER — ASPIRIN 300 MG RE SUPP: 300.0000 mg | Freq: Every day | RECTAL | Status: DC

## 2021-09-18 MED ORDER — PRAVASTATIN SODIUM 40 MG PO TABS
40.0000 mg | ORAL_TABLET | Freq: Every day | ORAL | Status: DC
  Administered 2021-09-18 – 2021-09-21 (×4): 40 mg
  Filled 2021-09-18 (×5): qty 1

## 2021-09-18 MED ORDER — ASPIRIN 325 MG PO TABS
325.0000 mg | ORAL_TABLET | Freq: Every day | ORAL | Status: DC
  Administered 2021-09-19 – 2021-09-20 (×2): 325 mg
  Filled 2021-09-18 (×2): qty 1

## 2021-09-18 MED ORDER — QUETIAPINE FUMARATE 25 MG PO TABS
25.0000 mg | ORAL_TABLET | Freq: Every day | ORAL | Status: DC
  Administered 2021-09-18: 25 mg
  Filled 2021-09-18: qty 1

## 2021-09-18 MED ORDER — OXYCODONE HCL 5 MG PO TABS
5.0000 mg | ORAL_TABLET | ORAL | Status: DC | PRN
  Administered 2021-09-18 – 2021-09-21 (×12): 5 mg
  Filled 2021-09-18 (×13): qty 1

## 2021-09-18 MED ORDER — DEXMEDETOMIDINE HCL IN NACL 400 MCG/100ML IV SOLN
0.2000 ug/kg/h | INTRAVENOUS | Status: DC
  Administered 2021-09-18: 0.4 ug/kg/h via INTRAVENOUS
  Administered 2021-09-19: 0.7 ug/kg/h via INTRAVENOUS
  Administered 2021-09-19: 0.4 ug/kg/h via INTRAVENOUS
  Administered 2021-09-19: 0.7 ug/kg/h via INTRAVENOUS
  Administered 2021-09-20: 0.5 ug/kg/h via INTRAVENOUS
  Administered 2021-09-20: 0.7 ug/kg/h via INTRAVENOUS
  Administered 2021-09-20: 0.5 ug/kg/h via INTRAVENOUS
  Administered 2021-09-21: 0.7 ug/kg/h via INTRAVENOUS
  Administered 2021-09-21: 0.6 ug/kg/h via INTRAVENOUS
  Filled 2021-09-18 (×10): qty 100

## 2021-09-18 MED ORDER — HALOPERIDOL LACTATE 5 MG/ML IJ SOLN: 5.0000 mg | Freq: Four times a day (QID) | INTRAMUSCULAR | Status: DC | PRN

## 2021-09-18 MED ORDER — DEXMEDETOMIDINE HCL IN NACL 400 MCG/100ML IV SOLN: 0.4000 ug/kg/h | INTRAVENOUS | Status: DC

## 2021-09-18 MED ORDER — LORAZEPAM 2 MG/ML IJ SOLN
0.5000 mg | Freq: Once | INTRAMUSCULAR | Status: AC
  Administered 2021-09-18: 0.5 mg via INTRAVENOUS
  Filled 2021-09-18: qty 1

## 2021-09-18 MED ORDER — THIAMINE HCL 100 MG PO TABS
100.0000 mg | ORAL_TABLET | Freq: Every day | ORAL | Status: DC
  Administered 2021-09-18: 100 mg via ORAL
  Filled 2021-09-18: qty 1

## 2021-09-18 MED ORDER — THIAMINE HCL 100 MG/ML IJ SOLN: 100.0000 mg | Freq: Every day | INTRAMUSCULAR | Status: DC

## 2021-09-18 MED ORDER — METOPROLOL TARTRATE 5 MG/5ML IV SOLN: 5.0000 mg | Freq: Three times a day (TID) | INTRAVENOUS | Status: DC | PRN

## 2021-09-18 MED ORDER — THIAMINE HCL 100 MG/ML IJ SOLN
100.0000 mg | Freq: Every day | INTRAMUSCULAR | Status: DC
  Administered 2021-09-20 – 2021-09-21 (×2): 100 mg via INTRAVENOUS
  Filled 2021-09-18 (×2): qty 2

## 2021-09-18 MED ORDER — GABAPENTIN 300 MG PO CAPS
900.0000 mg | ORAL_CAPSULE | Freq: Every day | ORAL | Status: DC
  Administered 2021-09-18: 900 mg
  Filled 2021-09-18: qty 3

## 2021-09-18 NOTE — Progress Notes (Signed)
PT Cancellation Note ? ?Patient Details ?Name: Anthony Ho ?MRN: [id number] ?DOB: 03-Mar-1932 ? ? ?Cancelled Treatment:    Reason Eval/Treat Not Completed: Patient at procedure or test/unavailable; RN reports pt going for stat head CT.  Will attempt another day. ? ? ?Reginia Naas ?09/18/2021, 11:40 AM ?Magda Kiel, PT ?Acute Rehabilitation Services ?EQJEA:[phone] ?[phone] ?09/18/2021 ? ?

## 2021-09-18 NOTE — Progress Notes (Signed)
OT Cancellation Note ? ?Patient Details ?Name: Anthony Ho ?MRN: [id number] ?DOB: 05-10-1932 ? ? ?Cancelled Treatment:    Reason Eval/Treat Not Completed: Fatigue/lethargy limiting ability to participate (Pt too lethargic; RN request OT evaluation to re-attempt at a later time/date.) ? ?[name] [name] ?09/18/2021, 8:43 AM ?

## 2021-09-18 NOTE — Progress Notes (Signed)
OT Cancellation Note ? ?Patient Details ?Name: Anthony Ho ?MRN: [id number] ?DOB: [birth year] ? ? ?Cancelled Treatment:    Reason Eval/Treat Not Completed: Patient at procedure or test/ unavailable (Pt headed for stat CT; OT evaluation will attempt another day when appropriate for therapy participation.) ? ?Anthony Ho ?09/18/2021, 11:42 AM ?

## 2021-09-18 NOTE — Progress Notes (Signed)
He is 4 pt restraints, moves ext well, in collar. Would mobilize as tolerated, may help with reorientation ?

## 2021-09-18 NOTE — Progress Notes (Signed)
Patient is currently in [place] per family. RT will come back to get ABG when patient returns.  ?

## 2021-09-18 NOTE — Progress Notes (Signed)
Patient in restraints at this time. Confused & agitated. CPAP held at this time. RT will monitor as needed. ?

## 2021-09-18 NOTE — Progress Notes (Signed)
CT results received, Stroke team notified at 1339. Clinical update provided to family at bedside, DNR/DNI status confirmed. Dr. Cheral Marker to evaluate the patient. Palliative consult placed.  ? ?Anthony Oka, MD ?General and Trauma Surgery ?[facility] ? ?

## 2021-09-18 NOTE — TOC Initial Note (Signed)
Transition of Care (TOC) - Initial/Assessment Note  ? ? ?Patient Details  ?Name: Anthony Ho ?MRN: [id number] ?Date of Birth: 05/31/1932 ? ?Transition of Care [facility]) CM/SW Contact:    ?Ella Bodo, RN ?Phone Number: ?09/18/2021, 11:00am ? ?Clinical Narrative:                 ?Pt admitted on 09/25/2021 after falling out of bed; he sustained sternal fx with mediastinal hematoma, Lt 2nd rib fx; C6 fx, C7 TP fx, C3-C5 SP fx, T2 compression fx, and right VA injury.  PTA, pt resided at [facility] with his wife.  Met with pt's daughter, Rydge Texidor, this morning: introduced myself and explained Trauma Case Manager role.  She is cautiously optimistic, but state she is unsure how all of this will go.  She states patient was in rehab at [facility] in November, but recovered nicely.  Will continue to follow as patient progresses; noted patient lethargic and in restraints currently.   ? ?Expected Discharge Plan: [place] ?Barriers to Discharge: Continued Medical Work up ? ? ?  ?  ?  ? ?Expected Discharge Plan and Services ?Expected Discharge Plan: [place] ?  ?Discharge Planning Services: CM Consult ?  ?Living arrangements for the past 2 months: [place] [facility]) ?                ?  ?  ?  ?  ?  ?  ?  ?  ?  ?  ? ?Prior Living Arrangements/Services ?Living arrangements for the past 2 months: [place] [facility]) ?Lives with:: Spouse ?Patient language and need for interpreter reviewed:: Yes ?Do you feel safe going back to the place where you live?: Yes      ?Need for Family Participation in Patient Care: Yes (Comment) ?Care giver support system in place?: Yes (comment) ?  ?Criminal Activity/Legal Involvement Pertinent to Current Situation/Hospitalization: No - Comment as needed ? ?  ?   ?   ?   ?   ? ?Emotional Assessment ?Appearance:: Appears stated age ?Attitude/Demeanor/Rapport: Unable to Assess ?Affect (typically observed): Unable to  Assess ?  ?  ?  ? ?Admission diagnosis:  Cervical spine fracture ([place]) [S12.9XXA] ?Fall, initial encounter [W19.XXXA] ?Injury of right vertebral artery, initial encounter [S15.101A] ?Fracture of body of sternum, initial encounter for closed fracture [S22.22XA] ?Closed fracture of cervical vertebra, unspecified cervical vertebral level, initial encounter ([place]) [S12.9XXA] ?Compression fracture of T2 vertebra, initial encounter ([place]) [H20.947S] ?Patient Active Problem List  ? Diagnosis Date Noted  ? Cervical spine fracture ([place]) 10/10/2021  ? Bilateral edema of lower extremity 12/23/2014  ? Sinus bradycardia 05/05/2013  ? OSA on CPAP 10/14/2012  ? MRSA infection 10/14/2012  ? Nocturia 10/14/2012  ? A-fib ([place]) 05/24/2011  ? Aortic insufficiency 05/24/2011  ? GERD 09/12/2009  ? BARRETT'S ESOPHAGUS 09/12/2009  ? GASTRITIS 08/23/2009  ? GENITAL PRURITUS 08/23/2009  ? BENIGN NEOPLASM [place] DIGESTIVE SYSTEM 08/10/2009  ? DM 08/10/2009  ? Essential hypertension 08/10/2009  ? PERSONAL HX COLONIC POLYPS 08/10/2009  ? ?PCP:  Lajean Manes, MD ?Pharmacy:   ?[facility] [address] [place] [address] ?Phone: [phone] Fax: [phone] ? ?[id number] ?[place] ?[place] [address] ?Phone: [phone] Fax: [phone] ? ?[facility] -  [address] ?[address] ?[place] [address] ?Phone: [phone] Fax: [phone] ? ? ? ? ?Social Determinants of Health (SDOH) Interventions ?  ? ?Readmission Risk Interventions ?   ? View : No data to display.  ?  ?  ?  ? ?Reinaldo Raddle, RN, BSN  ?Trauma/Neuro ICU Case Manager ?[phone] ? ? ? ?
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
## 2021-09-18 NOTE — Progress Notes (Addendum)
? ? ?   ?Subjective: ?CC: ?Patients daughters at bedside. Patient somnolent this morning but awakes to voice, opens eyes and will move extremities x 4 on command. He was agitated last night, pulling off C-Collar and tele leads. Placed in soft restraints. Received Morphine around 5am.  ? ?Objective: ?Vital signs in last 24 hours: ?Temp:  [97.7 ?F (36.5 ?C)-98.8 ?F (37.1 ?C)] 98.4 ?F (36.9 ?C) (04/04 0802) ?Pulse Rate:  [69-93] 87 (04/04 0802) ?Resp:  [0-22] 0 (04/04 0802) ?BP: (140-202)/(69-105) 156/82 (04/04 0802) ?SpO2:  [91 %-100 %] 98 % (04/04 0802) ?Weight:  [85.3 kg-90.7 kg] 85.3 kg (04/03 2030) ?Last BM Date :  (PTA) ? ?Intake/Output from previous day: ?04/03 0701 - 04/04 0700 ?In: 904.6 [P.O.:120; I.V.:684.6; IV Piggyback:100] ?Out: 800 [Urine:800] ?Intake/Output this shift: ?No intake/output data recorded. ? ?PE: ?Gen/Neuro: In soft restraints. Somnolent this morning but awakes to voice, opens eyes and will move extremities x 4 on command ?HEENT: Right pupil reactive to light. L eye prothesis  ?Card:  Irregular rhythm, regular rate ?Pulm:  CTAB, no W/R/R, effort normal. On 4L  ?Abd: Soft, ND, NT +BS ?Ext: BUE/BLE without edema ? ?Lab Results:  ?Recent Labs  ?  09/16/2021 ?0803 09/18/21 ?4097  ?WBC 14.4* 13.0*  ?HGB 16.7 14.8  ?HCT 48.6 44.9  ?PLT 145* 110*  ? ?BMET ?Recent Labs  ?  09/18/2021 ?0803 09/18/21 ?0256  ?NA 137 141  ?K 3.1* 3.9  ?CL 102 106  ?CO2 21* 26  ?GLUCOSE 230* 152*  ?BUN 20 17  ?CREATININE 1.73* 1.76*  ?CALCIUM 9.3 8.5*  ? ?PT/INR ?No results for input(s): LABPROT, INR in the last 72 hours. ?CMP  ?   ?Component Value Date/Time  ? NA 141 09/18/2021 0256  ? NA 136 11/26/2019 1036  ? K 3.9 09/18/2021 0256  ? CL 106 09/18/2021 0256  ? CO2 26 09/18/2021 0256  ? GLUCOSE 152 (H) 09/18/2021 0256  ? BUN 17 09/18/2021 0256  ? BUN 31 (H) 11/26/2019 1036  ? CREATININE 1.76 (H) 09/18/2021 0256  ? CALCIUM 8.5 (L) 09/18/2021 0256  ? PROT 7.4 11/26/2019 1036  ? ALBUMIN 4.3 11/26/2019 1036  ? AST 17  11/26/2019 1036  ? ALT 10 11/26/2019 1036  ? ALKPHOS 60 11/26/2019 1036  ? BILITOT 0.6 11/26/2019 1036  ? GFRNONAA 37 (L) 09/18/2021 0256  ? GFRAA 35 (L) 11/26/2019 1036  ? ?Lipase  ?No results found for: LIPASE ? ?Studies/Results: ?CT Head Wo Contrast ? ?Result Date: 10/06/2021 ?CLINICAL DATA:  Head trauma, neck trauma EXAM: CT HEAD WITHOUT CONTRAST CT CERVICAL SPINE WITHOUT CONTRAST TECHNIQUE: Multidetector CT imaging of the head and cervical spine was performed following the standard protocol without intravenous contrast. Multiplanar CT image reconstructions of the cervical spine were also generated. RADIATION DOSE REDUCTION: This exam was performed according to the departmental dose-optimization program which includes automated exposure control, adjustment of the mA and/or kV according to patient size and/or use of iterative reconstruction technique. COMPARISON:  None 122 FINDINGS: CT HEAD FINDINGS Brain: Generalized atrophy. Normal ventricular morphology. No midline shift or mass effect. Small vessel chronic ischemic changes of deep cerebral white matter. No intracranial hemorrhage, mass lesion, evidence of acute infarction, or extra-axial fluid collection. Vascular: Atherosclerotic calcification of internal carotid arteries at skull base Skull: Intact Sinuses/Orbits: Mucosal thickening throughout the paranasal sinuses. LEFT optic prosthesis. Other: N/A CT CERVICAL SPINE FINDINGS Alignment: Minimal anterolisthesis C3-C4. Remaining alignments normal. Skull base and vertebrae: Osseous demineralization. Multilevel facet degenerative changes. Multilevel degenerative disc disease  changes with disc space narrowing and endplate spur formation. Sclerosis at inferior endplate of T2, incompletely visualized. Spinous process fracture of C6, mildly distracted. Fractures of the posterior superior aspect of the C6 vertebral body, minimally displaced. Fracture plane extends to involve the medial wall of the RIGHT vertebral  foramen at C6. Fractures of BILATERAL lamina of C6. Comminuted displaced fracture head and medial aspect of the LEFT clavicle, healing/old. Soft tissues and spinal canal: Prevertebral soft tissues normal thickness. Atherosclerotic calcifications at carotid bifurcations bilaterally as well as proximal great vessels. Disc levels:  No additional abnormalities Upper chest: Lung apices clear Other: N/A IMPRESSION: Atrophy with small vessel chronic ischemic changes of deep cerebral white matter. No acute intracranial abnormalities. Multilevel degenerative disc and facet disease changes of the cervical spine. Fractures of the posterior superior aspect of the C6 vertebral body, BILATERAL C6vlamina, and the spinous process of C6. Fracture plane extends to involve the medial wall of the RIGHT vertebral foramen at C6; this raises concern for potential vertebral artery injury and CTA imaging of the neck should be considered. Old/healing comminuted displaced fracture head and medial aspect of the LEFT clavicle. Critical Value/emergent results were called by telephone at the time of interpretation on 10/03/2021 at there 0902 hours to provider Davonna Belling MD, who verbally acknowledged these results. Electronically Signed   By: Lavonia Dana M.D.   On: 10/14/2021 09:06  ? ?CT Angio Neck W and/or Wo Contrast ? ?Result Date: 10/13/2021 ?CLINICAL DATA:  Provided history: Neck trauma, arterial injury suspected. Fall. EXAM: CT ANGIOGRAPHY NECK TECHNIQUE: Multidetector CT imaging of the neck was performed using the standard protocol during bolus administration of intravenous contrast. Multiplanar CT image reconstructions and MIPs were obtained to evaluate the vascular anatomy. Carotid stenosis measurements (when applicable) are obtained utilizing NASCET criteria, using the distal internal carotid diameter as the denominator. RADIATION DOSE REDUCTION: This exam was performed according to the departmental dose-optimization program which  includes automated exposure control, adjustment of the mA and/or kV according to patient size and/or use of iterative reconstruction technique. CONTRAST:  179m OMNIPAQUE IOHEXOL 350 MG/ML SOLN COMPARISON:  CT cervical spine 09/26/2021. FINDINGS: Aortic arch: Standard aortic branching. Atherosclerotic plaque within the visualized aortic arch and proximal major branch vessels of the neck. Streak and beam hardening artifact arising from a dense left-sided contrast bolus partially obscures the left subclavian artery. Within this limitation, there is no appreciable hemodynamically significant innominate or proximal left subclavian artery stenosis. Right carotid system: CCA and ICA patent within the neck. Moderate atherosclerotic plaque about the carotid bifurcation and within the proximal ICA. Resultant 40-50 % stenosis of the proximal ICA. Partially retropharyngeal course of the cervical ICA. Prominent calcified plaque within the intracranial ICA with resultant at least moderate stenosis within the cavernous/supraclinoid right ICA at the imaged levels. Left carotid system: CCA and ICA patent within the neck without significant stenosis (50% or greater). Moderate atherosclerotic plaque about the carotid bifurcation and within the proximal ICA. Partially retropharyngeal course of the cervical ICA. Prominent calcified plaque within the intracranial ICA. Resultant moderate/severe stenosis within the cavernous left ICA at the imaged levels. Vertebral arteries: The right vertebral artery is patent at its origin. Severe atherosclerotic stenosis at the origin of the right vertebral artery. The right vertebral artery appears near-occluded shortly beyond its origin through the C2 level. There is more robust enhancement within the distal cervical right vertebral artery and intracranial right vertebral artery, possibly due to retrograde flow. The left vertebral artery is patent within  the neck without evidence of traumatic  vascular injury. Mild nonstenotic atherosclerotic plaque at the origin of the left vertebral artery and within the V4 vertebral arteries, bilaterally. Moderate/severe stenosis within the proximal to mid basilar artery. [place]

## 2021-09-18 NOTE — Progress Notes (Signed)
Case d/w Neurology and recs to avoid haldol in favor of ativan. Given the patient's earlier neuro status and current code status, I would rather avoid ativan to prevent further neurologic decline secondary to medication effect. Will plan to transfer to ICU and initiate precedex for behavior and symptoms of alcohol withdrawal as needed. Okay to transport to MRI, will be accompanied by TRN.  ? ?Jesusita Oka, MD ?General and Trauma Surgery ?[facility] ? ?

## 2021-09-19 ENCOUNTER — Inpatient Hospital Stay (HOSPITAL_COMMUNITY): Payer: Medicare Other

## 2021-09-19 DIAGNOSIS — S129XXA Fracture of neck, unspecified, initial encounter: Secondary | ICD-10-CM

## 2021-09-19 DIAGNOSIS — I6389 Other cerebral infarction: Secondary | ICD-10-CM | POA: Diagnosis not present

## 2021-09-19 DIAGNOSIS — I639 Cerebral infarction, unspecified: Secondary | ICD-10-CM | POA: Diagnosis not present

## 2021-09-19 LAB — URINE CULTURE: Culture: NO GROWTH

## 2021-09-19 LAB — ECHOCARDIOGRAM COMPLETE
AR max vel: 2.19 cm2
AV Area VTI: 2.49 cm2
AV Area mean vel: 2.5 cm2
AV Mean grad: 2 mmHg
AV Peak grad: 5.6 mmHg
Ao pk vel: 1.18 m/s
Height: 70 in
P 1/2 time: 447 msec
S' Lateral: 3.3 cm
Weight: 3008.84 oz

## 2021-09-19 LAB — BASIC METABOLIC PANEL
Anion gap: 10 (ref 5–15)
BUN: 23 mg/dL (ref 8–23)
CO2: 23 mmol/L (ref 22–32)
Calcium: 8.3 mg/dL — ABNORMAL LOW (ref 8.9–10.3)
Chloride: 107 mmol/L (ref 98–111)
Creatinine, Ser: 2.02 mg/dL — ABNORMAL HIGH (ref 0.61–1.24)
GFR, Estimated: 31 mL/min — ABNORMAL LOW (ref >60)
Glucose, Bld: 149 mg/dL — ABNORMAL HIGH (ref 70–99)
Potassium: 3.6 mmol/L (ref 3.5–5.1)
Sodium: 140 mmol/L (ref 135–145)

## 2021-09-19 LAB — GLUCOSE, CAPILLARY
Glucose-Capillary: 128 mg/dL — ABNORMAL HIGH (ref 70–99)
Glucose-Capillary: 143 mg/dL — ABNORMAL HIGH (ref 70–99)
Glucose-Capillary: 170 mg/dL — ABNORMAL HIGH (ref 70–99)
Glucose-Capillary: 170 mg/dL — ABNORMAL HIGH (ref 70–99)
Glucose-Capillary: 172 mg/dL — ABNORMAL HIGH (ref 70–99)
Glucose-Capillary: 183 mg/dL — ABNORMAL HIGH (ref 70–99)

## 2021-09-19 LAB — LIPID PANEL
Cholesterol: 125 mg/dL (ref 0–200)
HDL: 53 mg/dL (ref >40)
LDL Cholesterol: 47 mg/dL (ref 0–99)
Total CHOL/HDL Ratio: 2.4 RATIO
Triglycerides: 124 mg/dL (ref <150)
VLDL: 25 mg/dL (ref 0–40)

## 2021-09-19 LAB — CBC
HCT: 41.6 % (ref 39.0–52.0)
Hemoglobin: 13.7 g/dL (ref 13.0–17.0)
MCH: 27.6 pg (ref 26.0–34.0)
MCHC: 32.9 g/dL (ref 30.0–36.0)
MCV: 83.9 fL (ref 80.0–100.0)
Platelets: 92 10*3/uL — ABNORMAL LOW (ref 150–400)
RBC: 4.96 MIL/uL (ref 4.22–5.81)
RDW: 15 % (ref 11.5–15.5)
WBC: 13.7 10*3/uL — ABNORMAL HIGH (ref 4.0–10.5)
nRBC: 0 % (ref 0.0–0.2)

## 2021-09-19 MED ORDER — SPIRITUS FRUMENTI
1.0000 | Freq: Every evening | ORAL | Status: DC
  Administered 2021-09-19 – 2021-09-20 (×2): 1
  Filled 2021-09-19 (×3): qty 1

## 2021-09-19 MED ORDER — QUETIAPINE FUMARATE 25 MG PO TABS
50.0000 mg | ORAL_TABLET | Freq: Two times a day (BID) | ORAL | Status: DC
  Administered 2021-09-19: 50 mg
  Filled 2021-09-19: qty 2

## 2021-09-19 MED ORDER — GABAPENTIN 300 MG PO CAPS
600.0000 mg | ORAL_CAPSULE | Freq: Every day | ORAL | Status: DC
  Administered 2021-09-19 – 2021-09-21 (×3): 600 mg
  Filled 2021-09-19 (×3): qty 2

## 2021-09-19 MED ORDER — PANTOPRAZOLE 2 MG/ML SUSPENSION
40.0000 mg | Freq: Every day | ORAL | Status: DC
  Administered 2021-09-20 – 2021-09-21 (×2): 40 mg
  Filled 2021-09-19 (×3): qty 20

## 2021-09-19 MED ORDER — PERFLUTREN LIPID MICROSPHERE
1.0000 mL | INTRAVENOUS | Status: AC | PRN
  Administered 2021-09-19: 3 mL via INTRAVENOUS
  Filled 2021-09-19: qty 10

## 2021-09-19 NOTE — Progress Notes (Signed)
SLP Cancellation Note ? ?Patient Details ?Name: Anthony Ho ?MRN: [id number] ?DOB: [birth year] ? ? ?Cancelled treatment:        Attempted to see pt for cognitive linguistic evaluation.  Spoke with RN.  Pt is not appropriate at this time; largely unresponsive and agitated in restraints. SLP to follow for medical readiness for assessment. ? ? ?Smayan Hackbart E Constantina Laseter , MA, CCC-SLP ?Acute Rehabilitation Services ?Office: [phone] ?09/19/2021, 9:46 AM ?

## 2021-09-19 NOTE — Procedures (Signed)
Cortrak ? ?Person Inserting Tube:  Chanette Demo, Creola Corn, RD ?Tube Type:  Cortrak - 43 inches ?Tube Size:  10 ?Tube Location:  Left nare ?Secured by: Dorann Lodge ?Technique Used to Measure Tube Placement:  Marking at nare/corner of mouth ?Cortrak Secured At:  70 cm ? ?Cortrak Tube Team Note: ? ?Consult received to place a Cortrak feeding tube.  ? ?X-ray is required, abdominal x-ray has been ordered by the Cortrak team. Please confirm tube placement before using the Cortrak tube.  ? ?If the tube becomes dislodged please keep the tube and contact the Cortrak team at [email or web address] (password [email or web address]) for replacement.  ?If after hours and replacement cannot be delayed, place a NG tube and confirm placement with an abdominal x-ray.  ? ? ? ?Theone Stanley., MS, RD, LDN (she/her/hers) ?RD pager number and weekend/on-call pager number located in [place]. ? ? ?

## 2021-09-19 NOTE — Plan of Care (Signed)
?  Problem: Education: ?Goal: Knowledge of General Education information will improve ?Description: Including pain rating scale, medication(s)/side effects and non-pharmacologic comfort measures ?Outcome: Progressing ?  ?Problem: Education: ?Goal: Ability to verbalize activity precautions or restrictions will improve ?09/19/2021 0247 by Lennox Grumbles, RN ?Outcome: Progressing ?09/19/2021 0246 by Lennox Grumbles, RN ?Outcome: Progressing ?  ?Problem: Activity: ?Goal: Ability to avoid complications of mobility impairment will improve ?09/19/2021 0247 by Lennox Grumbles, RN ?Outcome: Progressing ?09/19/2021 0246 by Lennox Grumbles, RN ?Outcome: Progressing ?  ?Problem: Clinical Measurements: ?Goal: Ability to maintain clinical measurements within normal limits will improve ?Outcome: Progressing ?  ?Problem: Pain Management: ?Goal: Pain level will decrease ?09/19/2021 0247 by Lennox Grumbles, RN ?Outcome: Progressing ?09/19/2021 0246 by Lennox Grumbles, RN ?Outcome: Progressing ?  ?Problem: Skin Integrity: ?Goal: Will show signs of wound healing ?09/19/2021 0247 by Lennox Grumbles, RN ?Outcome: Progressing ?09/19/2021 0246 by Lennox Grumbles, RN ?Outcome: Progressing ?  ?Problem: Bladder/Genitourinary: ?Goal: Urinary functional status for postoperative course will improve ?09/19/2021 0247 by Lennox Grumbles, RN ?Outcome: Progressing ?09/19/2021 0246 by Lennox Grumbles, RN ?Outcome: Progressing ?  ?Problem: Safety: ?Goal: Non-violent Restraint(s) ?09/19/2021 0247 by Lennox Grumbles, RN ?Outcome: Progressing ?09/19/2021 0246 by Lennox Grumbles, RN ?Outcome: Progressing ?  ?Problem: Education: ?Goal: Knowledge of disease or condition will improve ?09/19/2021 0247 by Lennox Grumbles, RN ?Outcome: Progressing ?09/19/2021 0246 by Lennox Grumbles, RN ?Outcome: Progressing ?Goal: Knowledge of secondary prevention will improve (SELECT ALL) ?09/19/2021 0247 by Lennox Grumbles, RN ?Outcome: Progressing ?09/19/2021 0246 by Lennox Grumbles, RN ?Outcome: Progressing ?Goal:  Knowledge of patient specific risk factors will improve (INDIVIDUALIZE FOR PATIENT) ?Outcome: Progressing ?Goal: Individualized Educational Video(s) ?Outcome: Progressing ?  ?

## 2021-09-19 NOTE — Progress Notes (Signed)
Medication dose corrected to 0.7 max. System would not allow RN to correct in flowsheet after verification. ?

## 2021-09-19 NOTE — Progress Notes (Signed)
Palliative: ? ?Consult received and chart reviewed.  Update received from bedside nurse.  Met with daughter, Vaughan Basta, at bedside -she informs me there are actually 4 daughters however only 2, Manuela Schwartz and Vaughan Basta, are really involved in medical decision making.  We discussed role of palliative care and need for goals of care discussion.  Vaughan Basta informs me that the family is meeting with [facility] tomorrow to discuss care options especially for patient's spouse who has dementia.  She would like to proceed with goals of care discussion after meeting with [facility].  We discussed that palliative provider would reach out to family tomorrow to schedule goals of care discussion following their meeting with [facility]. ? ?Juel Burrow, DNP, AGNP-C ?Palliative Medicine Team ?Team Phone # [phone]  ?Pager # [phone] ? ?No charge ?

## 2021-09-19 NOTE — Progress Notes (Addendum)
Pt noted to have O2 saturations in low 90's. Per V.O., Dr. Thermon Leyland (Warren Lacy), "ok to have O2 greater than 88%". Pt on venturi mask d/t pt being a mouth breather. In addition to this, pt's daughter wanting to speak to the MD during day shift 09/19/21 pertaining to pt receiving, which appears to have been a scheduled aspirin 325 mg at [place] prior to being admitted to 4N ICU. Pt's daughter needing clarity/reason this med was given. Her having a concern for pt having a bleed around pt's cervical spine. Pt's daughter requesting to have her mother (dementia) stay w/ pt overnight alone! The safety of the pt, the visitor (wife), and the accountability of staff and [facility] of pt staying w/o proper caregiver being available. Charge RN informed and we eventually agreed to allow pt's daughter and wife to stay overnight this shift. ?

## 2021-09-19 NOTE — Evaluation (Signed)
Physical Therapy Evaluation ?Patient Details ?Name: Anthony Ho ?MRN: [id number] ?DOB: 10/24/1931 ?Today's Date: 09/19/2021 ? ?History of Present Illness ? 88 y.o. male presents to [facility] hospital on 09/21/2021 from ILF after fall out of bed. Pt found to have C6 fx, R vertebral artery occlusion at C2, C7 TP fx, C3-5 SP fxs, T2 compression fx, sternal body fx, L rib 2 fx, SDH from foramen magnum to T1. 4/4 pt found to have R cerebellar and L temporal lobe subacute infarcts. PMH includes A. Fib on Eliquis, DM2, HTN, HLD, OSA on CPAP, bladder CA s/p resection.  ?Clinical Impression ? Pt presents to PT with deficits in communication, cognition, balance, functional mobility. Pt inconsistently follows commands and does not communicate verbally other than moaning during this session. Pt does move all extremities spontaneously against gravity, and attempts to right postural deviations in sitting. Pt will benefit from continued acute PT services in an effort to improve mobility quality and reduce falls risk. Of note pt's daughter reports he is very hard of hearing at baseline. PT recommends AIR placement at this time, pending improvement in command following.   ?   ? ?Recommendations for follow up therapy are one component of a multi-disciplinary discharge planning process, led by the attending physician.  Recommendations may be updated based on patient status, additional functional criteria and insurance authorization. ? ?Follow Up Recommendations Acute inpatient rehab (3hours/day) (hopeful for improvement in command following) ? ?  ?Assistance Recommended at Discharge Frequent or constant Supervision/Assistance  ?Patient can return home with the following ? Two people to help with walking and/or transfers;Two people to help with bathing/dressing/bathroom;Assistance with cooking/housework;Assistance with feeding;Direct supervision/assist for medications management;Direct supervision/assist for financial management;Assist for  transportation;Help with stairs or ramp for entrance ? ?  ?Equipment Recommendations Wheelchair cushion (measurements PT);Wheelchair (measurements PT);Hospital bed;Other (comment) (hoyer lift)  ?Recommendations for Other Services ? Rehab consult  ?  ?Functional Status Assessment Patient has had a recent decline in their functional status and demonstrates the ability to make significant improvements in function in a reasonable and predictable amount of time.  ? ?  ?Precautions / Restrictions Precautions ?Precautions: Fall ?Precaution Comments: NG tube, C collar, 4 point restraits with posey belt ?Required Braces or Orthoses: Cervical Brace ?Cervical Brace: Hard collar;At all times ?Restrictions ?Weight Bearing Restrictions: No  ? ?  ? ?Mobility ? Bed Mobility ?Overal bed mobility: Needs Assistance ?Bed Mobility: Rolling, Sidelying to Sit, Sit to Supine ?Rolling: Total assist, +2 for physical assistance ?Sidelying to sit: Total assist, +2 for physical assistance ?  ?Sit to supine: Total assist, +2 for physical assistance ?  ?  ?  ? ?Transfers ?  ?  ?  ?  ?  ?  ?  ?  ?  ?  ?  ? ?Ambulation/Gait ?  ?  ?  ?  ?  ?  ?  ?  ? ?Stairs ?  ?  ?  ?  ?  ? ?Wheelchair Mobility ?  ? ?Modified Rankin (Stroke Patients Only) ?  ? ?  ? ?Balance Overall balance assessment: Needs assistance ?Sitting-balance support: Single extremity supported, No upper extremity supported, Feet supported ?Sitting balance-Leahy Scale: Poor ?Sitting balance - Comments: mod-maxA, does demonstrate some postural righting, reachign with RUE to catch loss of balance to right side ?Postural control: Right lateral lean, Posterior lean ?  ?  ?  ?  ?  ?  ?  ?  ?  ?  ?  ?  ?  ?  ?   ? ? ? ?  Pertinent Vitals/Pain Pain Assessment ?Pain Assessment: CPOT ?Breathing: occasional labored breathing, short period of hyperventilation ?Negative Vocalization: repeated troubled calling out, loud moaning/groaning, crying ?Facial Expression: sad, frightened, frown ?Body Language:  tense, distressed pacing, fidgeting ?Consolability: distracted or reassured by voice/touch ?PAINAD Score: 6  ? ? ?Home Living Family/patient expects to be discharged to:: Other (Comment) ([facility] at [facility]) ?  ?  ?  ?  ?  ?  ?  ?  ?Home Equipment: Kasandra Knudsen - single point ?Additional Comments: Pt lives in an ILF/ALF with wife, has four daughters one of which assists with higher level cog tasks and IADLs  ?  ?Prior Function Prior Level of Function : Needs assist;History of Falls (last six months) ?  ?  ?  ?  ?  ?  ?Mobility Comments: walks with cane, has had falls ?ADLs Comments: no assist with BADLs, daugnter assists with IADLs. pt has recently stopped driving 2/2 STM deficits ?  ? ? ?Hand Dominance  ?   ? ?  ?Extremity/Trunk Assessment  ? Upper Extremity Assessment ?Upper Extremity Assessment: Defer to OT evaluation ?  ? ?Lower Extremity Assessment ?Lower Extremity Assessment: Generalized weakness (pt moves all extremities against gravity, appears to wiggle toes on L foot to PT command, does not follow with R side this session) ?  ? ?Cervical / Trunk Assessment ?Cervical / Trunk Assessment: Other exceptions ?Cervical / Trunk Exceptions: hard cervical collar  ?Communication  ? Communication: Expressive difficulties;Receptive difficulties;HOH (pt moaning throughout session, does not produce words)  ?Cognition Arousal/Alertness: Lethargic ?Behavior During Therapy: Restless ?Overall Cognitive Status: Difficult to assess ?Area of Impairment: Following commands ?  ?  ?  ?  ?  ?  ?  ?  ?  ?  ?  ?Following Commands: Follows one step commands inconsistently ?  ?  ?  ?General Comments: pt does not consistently follow commands, eyes open but does not appear to follow. Pt is very hard of hearing at baseline, difficult to assess whether he hears PT communication due to impaired expression. ?  ?  ? ?  ?General Comments General comments (skin integrity, edema, etc.): VSS on 6L [place] ? ?  ?Exercises    ? ?Assessment/Plan  ?   ?PT Assessment Patient needs continued PT services  ?PT Problem List Decreased activity tolerance;Decreased balance;Decreased mobility;Decreased coordination;Decreased cognition;Decreased knowledge of use of DME;Decreased safety awareness;Decreased knowledge of precautions;Pain ? ?   ?  ?PT Treatment Interventions DME instruction;Gait training;Functional mobility training;Therapeutic activities;Therapeutic exercise;Balance training;Neuromuscular re-education;Cognitive remediation;Patient/family education   ? ?PT Goals (Current goals can be found in the Care Plan section)  ?Acute Rehab PT Goals ?Patient Stated Goal: to reduce caregiver burden and improve functional mobility quality ?PT Goal Formulation: With family ?Time For Goal Achievement: 10/03/21 ?Potential to Achieve Goals: Fair ? ?  ?Frequency Min 4X/week ?  ? ? ?Co-evaluation PT/OT/SLP Co-Evaluation/Treatment: Yes ?Reason for Co-Treatment: Complexity of the patient's impairments (multi-system involvement);Necessary to address cognition/behavior during functional activity;For patient/therapist safety;To address functional/ADL transfers ?PT goals addressed during session: Mobility/safety with mobility;Balance;Strengthening/ROM ?OT goals addressed during session: ADL's and self-care ?  ? ? ?  ?AM-PAC PT "6 Clicks" Mobility  ?Outcome Measure Help needed turning from your back to your side while in a flat bed without using bedrails?: Total ?Help needed moving from lying on your back to sitting on the side of a flat bed without using bedrails?: Total ?Help needed moving to and from a bed to a chair (including a wheelchair)?: Total ?Help needed standing up  from a chair using your arms (e.g., wheelchair or bedside chair)?: Total ?Help needed to walk in hospital room?: Total ?Help needed climbing 3-5 steps with a railing? : Total ?6 Click Score: 6 ? ?  ?End of Session Equipment Utilized During Treatment: Cervical collar ?Activity Tolerance: Patient tolerated  treatment well ?Patient left: in bed;with call bell/phone within reach;with bed alarm set;with restraints reapplied ?Nurse Communication: Mobility status;Need for lift equipment ?PT Visit Diagnosis: Other abnormalit

## 2021-09-19 NOTE — Progress Notes (Signed)
Trauma Event Note ? ? ?TRN rounded on patient. Remains on precedex infusion, some agitation, remains in restraints. S/p cortrax today. Family at bedside. Checked in with primary nurse, no needs at this time. Pending family meeting with palliative for [place].  ? ?Last imported Vital Signs ?BP (!) 155/83   Pulse 79   Temp 99.2 ?F (37.3 ?C) (Axillary)   Resp 13   Ht '5\' 10"'$  (1.778 m)   Wt 188 lb 0.8 oz (85.3 kg)   SpO2 92%   BMI 26.98 kg/m?  ? ?Trending CBC ?Recent Labs  ?  09/18/2021 ?0803 09/18/21 ?0256 09/19/21 ?0457  ?WBC 14.4* 13.0* 13.7*  ?HGB 16.7 14.8 13.7  ?HCT 48.6 44.9 41.6  ?PLT 145* 110* 92*  ? ? ?Trending Coag's ?No results for input(s): APTT, INR in the last 72 hours. ? ?Trending BMET ?Recent Labs  ?  09/26/2021 ?0803 09/18/21 ?0256 09/19/21 ?0457  ?NA 137 141 140  ?K 3.1* 3.9 3.6  ?CL 102 106 107  ?CO2 21* 26 23  ?BUN '20 17 23  '$ ?CREATININE 1.73* 1.76* 2.02*  ?GLUCOSE 230* 152* 149*  ? ? ? ? ?[place]  ?Trauma Response RN ? ?Please call TRN at [phone] for further assistance. ? ? ?  ?
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
## 2021-09-19 NOTE — Progress Notes (Signed)
Pt in restraints. CPAP held for the evening. RT will cont to monitor.  ?

## 2021-09-19 NOTE — Progress Notes (Signed)
Patient seen this morning and we had a long discussion with his daughter.  He has now had what appears to be a right cerebellar infarct and a left temporal infarct.  I discussed this with trauma yesterday and we decided it was safe to start aspirin in the face of this.  He is not a candidate for oral anticoagulation at this time.  He has been moved to the unit and been started on Precedex for agitation.  He moves all extremities in bed.  He remains in his cervical collar. ? ?Very difficult situation and I have discussed this at length with the daughter.  Given his advanced age and the multiple issues he is facing now, the prognosis for excellent functional recovery is certainly guarded.  We continue to follow along and will offer counseling to the family in addition to our care of the patient. ?

## 2021-09-19 NOTE — Evaluation (Signed)
Occupational Therapy Evaluation ?Patient Details ?Name: Anthony Ho ?MRN: [id number] ?DOB: 01-13-1932 ?Today's Date: 09/19/2021 ? ? ?History of Present Illness 88 y.o. male presents to [facility] hospital on 10/14/2021 from ILF after fall out of bed. Pt found to have C6 fx, R vertebral artery occlusion at C2, C7 TP fx, C3-5 SP fxs, T2 compression fx, sternal body fx, L rib 2 fx, SDH from foramen magnum to T1. 4/4 pt found to have R cerebellar and L temporal lobe subacute infarcts. PMH includes A. Fib on Eliquis, DM2, HTN, HLD, OSA on CPAP, bladder CA s/p resection.  ? ?Clinical Impression ?  ?Anthony Ho was evaluated s/p the above admission list; per family report he is generally mod I with use of SPC at baseline with a few falls in the last year. His daughter assists with IADLs, and he lives with his spouse at [place]. Anthony Ho followed <10% of commands this date and had his eyes closed for 50% of the session with no R pupillary reaction to light or noxious stimuli. He required total A +2 for bed mobility, but once sitting EOB he was mod-max A for sitting balance. He seemed to move all four extremities equally for protective and righting reactions, no active movement to command. Pt will benefit from OT acutely; recommend AIR in anticipation that pt will have progressed tolerance for intensive therapy in acute care.  ?   ? ?Recommendations for follow up therapy are one component of a multi-disciplinary discharge planning process, led by the attending physician.  Recommendations may be updated based on patient status, additional functional criteria and insurance authorization.  ? ?Follow Up Recommendations ? Acute inpatient rehab (3hours/day)  ?  ?Assistance Recommended at Discharge Frequent or constant Supervision/Assistance  ?Patient can return home with the following Two people to help with walking and/or transfers;Two people to help with bathing/dressing/bathroom;Assistance with cooking/housework;Assistance with feeding;Direct  supervision/assist for medications management;Assist for transportation;Help with stairs or ramp for entrance ? ?  ?Functional Status Assessment ? Patient has had a recent decline in their functional status and demonstrates the ability to make significant improvements in function in a reasonable and predictable amount of time.  ?Equipment Recommendations ? Other (comment) (continue to assess for equipmetn needs at d/c)  ?  ?Recommendations for Other Services Rehab consult ? ? ?  ?Precautions / Restrictions Precautions ?Precautions: Fall ?Precaution Comments: NG tube, C collar, 4 point restraits with posey belt ?Required Braces or Orthoses: Cervical Brace ?Cervical Brace: Hard collar;At all times ?Restrictions ?Weight Bearing Restrictions: No  ? ?  ? ?Mobility Bed Mobility ?Overal bed mobility: Needs Assistance ?Bed Mobility: Rolling, Sidelying to Sit, Sit to Supine ?Rolling: Total assist, +2 for physical assistance ?Sidelying to sit: Total assist, +2 for physical assistance ?  ?Sit to supine: Total assist, +2 for physical assistance ?  ?  ?  ? ?Transfers ?  ?  ?  ?  ?  ?  ?  ?  ?  ?  ?  ? ?  ?Balance Overall balance assessment: Needs assistance ?Sitting-balance support: Single extremity supported, No upper extremity supported, Feet supported ?Sitting balance-Leahy Scale: Poor ?Sitting balance - Comments: mod-maxA, does demonstrate some postural righting, reachign with RUE to catch loss of balance to right side ?Postural control: Right lateral lean, Posterior lean ?  ?  ?  ?  ?  ?  ?  ?  ?  ?  ?  ?  ?  ?  ?   ? ?ADL either performed or assessed  with clinical judgement  ? ?ADL Overall ADL's : Needs assistance/impaired ?  ?  ?  ?  ?  ?  ?  ?  ?  ?  ?  ?  ?  ?  ?  ?  ?  ?  ?  ?General ADL Comments: total A for all aspects of care  ? ? ? ?Vision Baseline Vision/History:  (prosthetic L eye) ?Ability to See in Adequate Light: 1 Impaired ?Patient Visual Report:  (unable to report) ?Vision Assessment?: Vision impaired- to be  further tested in functional context ?Additional Comments: R pupil dialated; no pupilary reation to light. No attending ro eye movement to stimuli - L eye is prosthetic  ?   ?Perception   ?  ?Praxis   ?  ? ?Pertinent Vitals/Pain    ? ? ? ?Hand Dominance   ?  ?Extremity/Trunk Assessment Upper Extremity Assessment ?Upper Extremity Assessment: Generalized weakness;Difficult to assess due to impaired cognition (moving R&L against gravity for protective and righting reactions. No active movement to command) ?  ?Lower Extremity Assessment ?Lower Extremity Assessment: Defer to PT evaluation ?  ?Cervical / Trunk Assessment ?Cervical / Trunk Assessment: Other exceptions ?Cervical / Trunk Exceptions: hard cervical collar ?  ?Communication Communication ?Communication: Expressive difficulties;Receptive difficulties;HOH ?  ?Cognition Arousal/Alertness: Lethargic ?Behavior During Therapy: Restless ?Overall Cognitive Status: Difficult to assess ?Area of Impairment: Following commands ?  ?  ?  ?  ?  ?  ?  ?  ?  ?  ?  ?Following Commands: Follows one step commands inconsistently ?  ?  ?  ?General Comments: pt does not consistently follow commands, eyes open but does not appear to follow. Pt is very hard of hearing at baseline, difficult to assess whether he hears PT communication due to impaired expression. ?  ?  ?General Comments  VSS on 6L [place]. BP stabl with position change ? ?  ?Exercises   ?  ?Shoulder Instructions    ? ? ?Home Living Family/patient expects to be discharged to:: Other (Comment) ?  ?  ?  ?  ?  ?  ?  ?  ?  ?  ?  ?  ?  ?  ?Home Equipment: Kasandra Knudsen - single point ?  ?Additional Comments: Pt lives in an ILF/ALF with wife, has four daughters one of which assists with higher level cog tasks and IADLs ?  ? ?  ?Prior Functioning/Environment Prior Level of Function : Needs assist;History of Falls (last six months) ?  ?  ?  ?  ?  ?  ?Mobility Comments: walks with cane, has had falls ?ADLs Comments: no assist with BADLs,  daugnter assists with IADLs. pt has recently stopped driving 2/2 STM deficits ?  ? ?  ?  ?OT Problem List: Decreased strength;Decreased range of motion;Decreased activity tolerance;Impaired balance (sitting and/or standing);Decreased safety awareness;Decreased knowledge of use of DME or AE;Decreased cognition;Decreased coordination;Decreased knowledge of precautions;Pain ?  ?   ?OT Treatment/Interventions: Self-care/ADL training;Therapeutic exercise;Patient/family education;Balance training;DME and/or AE instruction;Therapeutic activities  ?  ?OT Goals(Current goals can be found in the care plan section) Acute Rehab OT Goals ?Patient Stated Goal: unable to state ?OT Goal Formulation: With patient/family ?Time For Goal Achievement: 10/03/21 ?Potential to Achieve Goals: Good ?ADL Goals ?Pt Will Perform Grooming: with mod assist;sitting ?Pt Will Perform Upper Body Dressing: with mod assist;sitting ?Additional ADL Goal #1: Pt will complete bed mobility with mod A as a precursor to ADLs ?Additional ADL Goal #2: Pt will follow 1 step  commands 50% of the session to complete functional tasks  ?OT Frequency: Min 2X/week ?  ? ?Co-evaluation PT/OT/SLP Co-Evaluation/Treatment: Yes ?Reason for Co-Treatment: Complexity of the patient's impairments (multi-system involvement);Necessary to address cognition/behavior during functional activity;For patient/therapist safety;To address functional/ADL transfers ?PT goals addressed during session: Mobility/safety with mobility;Balance;Strengthening/ROM ?OT goals addressed during session: ADL's and self-care ?  ? ?  ?AM-PAC OT "6 Clicks" Daily Activity     ?Outcome Measure Help from another person eating meals?: Total ?Help from another person taking care of personal grooming?: Total ?Help from another person toileting, which includes using toliet, bedpan, or urinal?: Total ?Help from another person bathing (including washing, rinsing, drying)?: Total ?Help from another person to put on  and taking off regular upper body clothing?: Total ?Help from another person to put on and taking off regular lower body clothing?: Total ?6 Click Score: 6 ?  ?End of Session Equipment Utilized During [employer]

## 2021-09-19 NOTE — Progress Notes (Signed)
Ok to reduce gabapentin to '600mg'$  qhs due to crcl<30 ml/min. ? ?Onnie Boer, PharmD, BCIDP, AAHIVP, CPP ?Infectious Disease Pharmacist ?09/19/2021 2:24 PM ? ? ?

## 2021-09-19 NOTE — Progress Notes (Signed)
?  Echocardiogram ?2D Echocardiogram has been performed. ? ?Anthony Ho ?09/19/2021, 2:46 PM ?
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
## 2021-09-19 NOTE — Progress Notes (Signed)
? ?Trauma/Critical Care Follow Up Note ? ?Subjective:  ?  ?Overnight Issues:  ? ?Objective:  ?Vital signs for last 24 hours: ?Temp:  [97 ?F (36.1 ?C)-99.6 ?F (37.6 ?C)] 98.6 ?F (37 ?C) (04/05 1200) ?Pulse Rate:  [59-100] 84 (04/05 1300) ?Resp:  [8-20] 17 (04/05 1300) ?BP: (103-174)/(57-86) 174/86 (04/05 1300) ?SpO2:  [85 %-98 %] 88 % (04/05 1300) ?FiO2 (%):  [35 %-55 %] 35 % (04/05 0400) ? ?Hemodynamic parameters for last 24 hours: ?  ? ?Intake/Output from previous day: ?04/04 0701 - 04/05 0700 ?In: 1152.1 [I.V.:1152.1] ?Out: 1525 [Urine:1525]  ?Intake/Output this shift: ?Total I/O ?In: 515.3 [I.V.:515.3] ?Out: 275 [Urine:275] ? ?Vent settings for last 24 hours: ?FiO2 (%):  [35 %-55 %] 35 % ? ?Physical Exam:  ?Gen: comfortable, no distress ?Neuro: moaning, not f/c2 ?HEENT: PERRL ?Neck: supple ?CV: RRR ?Pulm: unlabored breathing ?Abd: soft, NT ?GU: clear yellow urine ?Extr: wwp, no edema ? ? ?Results for orders placed or performed during the hospital encounter of 10/04/2021 (from the past 24 hour(s))  ?Glucose, capillary     Status: Abnormal  ? Collection Time: 09/18/21  6:28 PM  ?Result Value Ref Range  ? Glucose-Capillary 217 (H) 70 - 99 mg/dL  ?Glucose, capillary     Status: Abnormal  ? Collection Time: 09/18/21  7:56 PM  ?Result Value Ref Range  ? Glucose-Capillary 204 (H) 70 - 99 mg/dL  ?Glucose, capillary     Status: Abnormal  ? Collection Time: 09/18/21 11:54 PM  ?Result Value Ref Range  ? Glucose-Capillary 131 (H) 70 - 99 mg/dL  ?Glucose, capillary     Status: Abnormal  ? Collection Time: 09/19/21  3:46 AM  ?Result Value Ref Range  ? Glucose-Capillary 170 (H) 70 - 99 mg/dL  ?CBC     Status: Abnormal  ? Collection Time: 09/19/21  4:57 AM  ?Result Value Ref Range  ? WBC 13.7 (H) 4.0 - 10.5 K/uL  ? RBC 4.96 4.22 - 5.81 MIL/uL  ? Hemoglobin 13.7 13.0 - 17.0 g/dL  ? HCT 41.6 39.0 - 52.0 %  ? MCV 83.9 80.0 - 100.0 fL  ? MCH 27.6 26.0 - 34.0 pg  ? MCHC 32.9 30.0 - 36.0 g/dL  ? RDW 15.0 11.5 - 15.5 %  ? Platelets 92  (L) 150 - 400 K/uL  ? nRBC 0.0 0.0 - 0.2 %  ?Basic metabolic panel     Status: Abnormal  ? Collection Time: 09/19/21  4:57 AM  ?Result Value Ref Range  ? Sodium 140 135 - 145 mmol/L  ? Potassium 3.6 3.5 - 5.1 mmol/L  ? Chloride 107 98 - 111 mmol/L  ? CO2 23 22 - 32 mmol/L  ? Glucose, Bld 149 (H) 70 - 99 mg/dL  ? BUN 23 8 - 23 mg/dL  ? Creatinine, Ser 2.02 (H) 0.61 - 1.24 mg/dL  ? Calcium 8.3 (L) 8.9 - 10.3 mg/dL  ? GFR, Estimated 31 (L) >60 mL/min  ? Anion gap 10 5 - 15  ?Lipid panel     Status: None  ? Collection Time: 09/19/21  4:57 AM  ?Result Value Ref Range  ? Cholesterol 125 0 - 200 mg/dL  ? Triglycerides 124 <150 mg/dL  ? HDL 53 >40 mg/dL  ? Total CHOL/HDL Ratio 2.4 RATIO  ? VLDL 25 0 - 40 mg/dL  ? LDL Cholesterol 47 0 - 99 mg/dL  ?Glucose, capillary     Status: Abnormal  ? Collection Time: 09/19/21  8:11 AM  ?Result Value Ref  Range  ? Glucose-Capillary 143 (H) 70 - 99 mg/dL  ?Glucose, capillary     Status: Abnormal  ? Collection Time: 09/19/21 12:15 PM  ?Result Value Ref Range  ? Glucose-Capillary 128 (H) 70 - 99 mg/dL  ? ? ?Assessment & Plan: ?The plan of care was discussed with the bedside nurse for the day, who is in agreement with this plan and no additional concerns were raised.  ? ?Present on Admission: ? Cervical spine fracture ([place]) ? ? ? LOS: 2 days  ? ?Additional comments:I reviewed the patient's new clinical lab test results.   and I reviewed the patients new imaging test results.   ? ?Fall out of bed ? ?Sternal fx w/ mediastinal hemoatoma - Pain control. Cardiac monitoring ?L 2nd rib fx - no ptx on CT or CXR 4/4 ?C6 Fx w/ blood around cord- Per NSGY. Had L hemiplegia initially that has since resolved. MRI w/ subdural hematoma throughout the cervical region from the foramen magnum to the level of T1 effacing the subarachnoid space surrounding the cord. Eliquis reversed with adnexxa. NSGY recommending no surgical intervention and if patient were to become quadriplegic, comfort care. PT/OT ?C7 TP  fx, C3-C5 SP fxs - Per NSGY. Multimodal pain control. continue c-collar ?T2 compression fx - per NSGY, non-op ?R VA Injury - vertebral artery occlusion at the level of C2 (common carotid, internal carotid and left vertebral arteries are patent). [id number] ?A. Fib on Eliquis - reversed. I d/w primary cardiologist, Dr. Ardis Hughs and recs for permanent discontinuation of Eliquis from a cardiac standpoint.  ?L temporal and R cerebellar infarcts - seen on CT 4/4. Neurology c/s, Dr. Cheral Marker, MRA completed and negative. Suspect stroke is 2/2 vert injury. [id number] started 4/4. Recs for MRI brain and possible Eliquis but lengthy multi-disciplinary discussion with Trauma, [place], Neurology, and Cardiology and decision is that risks of continued [facility] in an elderly patient with a history of falls and chronic alcohol abuse outweigh the benefits of stroke prevention. Family is agreeable to this plan. The only clinical decisions that will be modified by the results of MR are DOAC initiation and decompressive craniectomy. As the decision has already been made regarding DOAC and confirmation has been obtained from NSGY that patient would not be a surgical candidate, will defer any additional MR imaging at this time. Echo, lipid panel, hgbA1c per neuro recs.   ?DM2 - SSI ?HTN - Home meds + PRN meds.  ?HLD  ?CKD 3b - stable ?OSA on CPAP - CPAP at night ?Agitation - Soft restraints, incr Seroquel, add low dose ativan for alcohol withdrawal, cont precedex. Limit other sedating medications (narcotics). Check EKG in AM ?FEN - SLP eval, cortrak, IVF, bowel regimen ?VTE - SCDs, hold anticoagulation and chemical prophylaxis till cleared by NSGY ?Dispo - ICU, PT/OT ? ?Critical Care Total Time: 90 minutes ? ?Jesusita Oka, MD ?Trauma & General Surgery ?Please use [email or web address] to contact on call provider ? ?09/19/2021 ? ?*Care during the described time interval was provided by me. I have reviewed this patient's available data, including medical history,  events of note, physical examination and test results as part of my evaluation. ? ? ? ?

## 2021-09-19 NOTE — Progress Notes (Signed)
? ?  Inpatient Rehab Admissions Coordinator : ? ?Per therapy recommendations patient was screened for CIR candidacy by Danne Baxter RN MSN. Patient is not yet at a level to tolerate the intensity required to pursue a CIR admit .Noted from ILF at [facility]. Patient may have the potential to progress to become a candidate. The CIR admissions team will follow and monitor for progress and place a Rehab Consult order if felt to be appropriate. Please contact me with any questions. ? ?Danne Baxter RN MSN ?Admissions Coordinator ?[phone]  ?

## 2021-09-20 ENCOUNTER — Inpatient Hospital Stay (HOSPITAL_COMMUNITY): Payer: Medicare Other

## 2021-09-20 DIAGNOSIS — I4821 Permanent atrial fibrillation: Secondary | ICD-10-CM | POA: Diagnosis not present

## 2021-09-20 DIAGNOSIS — I10 Essential (primary) hypertension: Secondary | ICD-10-CM

## 2021-09-20 DIAGNOSIS — I639 Cerebral infarction, unspecified: Secondary | ICD-10-CM | POA: Diagnosis not present

## 2021-09-20 DIAGNOSIS — W19XXXA Unspecified fall, initial encounter: Secondary | ICD-10-CM

## 2021-09-20 DIAGNOSIS — R0989 Other specified symptoms and signs involving the circulatory and respiratory systems: Secondary | ICD-10-CM | POA: Diagnosis not present

## 2021-09-20 LAB — BASIC METABOLIC PANEL
Anion gap: 8 (ref 5–15)
BUN: 30 mg/dL — ABNORMAL HIGH (ref 8–23)
CO2: 26 mmol/L (ref 22–32)
Calcium: 8.2 mg/dL — ABNORMAL LOW (ref 8.9–10.3)
Chloride: 109 mmol/L (ref 98–111)
Creatinine, Ser: 1.93 mg/dL — ABNORMAL HIGH (ref 0.61–1.24)
GFR, Estimated: 33 mL/min — ABNORMAL LOW (ref >60)
Glucose, Bld: 133 mg/dL — ABNORMAL HIGH (ref 70–99)
Potassium: 3 mmol/L — ABNORMAL LOW (ref 3.5–5.1)
Sodium: 143 mmol/L (ref 135–145)

## 2021-09-20 LAB — CBC
HCT: 40 % (ref 39.0–52.0)
Hemoglobin: 13.6 g/dL (ref 13.0–17.0)
MCH: 27.9 pg (ref 26.0–34.0)
MCHC: 34 g/dL (ref 30.0–36.0)
MCV: 82 fL (ref 80.0–100.0)
Platelets: 106 10*3/uL — ABNORMAL LOW (ref 150–400)
RBC: 4.88 MIL/uL (ref 4.22–5.81)
RDW: 14.9 % (ref 11.5–15.5)
WBC: 14 10*3/uL — ABNORMAL HIGH (ref 4.0–10.5)
nRBC: 0 % (ref 0.0–0.2)

## 2021-09-20 LAB — GLUCOSE, CAPILLARY
Glucose-Capillary: 139 mg/dL — ABNORMAL HIGH (ref 70–99)
Glucose-Capillary: 143 mg/dL — ABNORMAL HIGH (ref 70–99)
Glucose-Capillary: 204 mg/dL — ABNORMAL HIGH (ref 70–99)
Glucose-Capillary: 209 mg/dL — ABNORMAL HIGH (ref 70–99)
Glucose-Capillary: 210 mg/dL — ABNORMAL HIGH (ref 70–99)
Glucose-Capillary: 274 mg/dL — ABNORMAL HIGH (ref 70–99)

## 2021-09-20 LAB — HEMOGLOBIN A1C
Hgb A1c MFr Bld: 7.2 % — ABNORMAL HIGH (ref 4.8–5.6)
Mean Plasma Glucose: 160 mg/dL

## 2021-09-20 LAB — PHOSPHORUS: Phosphorus: 3.1 mg/dL (ref 2.5–4.6)

## 2021-09-20 LAB — MAGNESIUM: Magnesium: 2.1 mg/dL (ref 1.7–2.4)

## 2021-09-20 MED ORDER — POLYETHYLENE GLYCOL 3350 17 G PO PACK
17.0000 g | PACK | Freq: Every day | ORAL | Status: DC
  Administered 2021-09-20 – 2021-09-21 (×2): 17 g
  Filled 2021-09-20 (×2): qty 1

## 2021-09-20 MED ORDER — DOCUSATE SODIUM 50 MG/5ML PO LIQD
100.0000 mg | Freq: Two times a day (BID) | ORAL | Status: DC
  Administered 2021-09-20 – 2021-09-21 (×4): 100 mg
  Filled 2021-09-20 (×4): qty 10

## 2021-09-20 MED ORDER — VITAL HIGH PROTEIN PO LIQD: 1000.0000 mL | ORAL | Status: DC

## 2021-09-20 MED ORDER — PIVOT 1.5 CAL PO LIQD
1000.0000 mL | ORAL | Status: DC
  Administered 2021-09-20 – 2021-09-21 (×2): 1000 mL
  Filled 2021-09-20 (×2): qty 1000

## 2021-09-20 MED ORDER — AMLODIPINE BESYLATE 10 MG PO TABS
10.0000 mg | ORAL_TABLET | Freq: Every day | ORAL | Status: DC
  Administered 2021-09-20 – 2021-09-21 (×2): 10 mg
  Filled 2021-09-20: qty 1

## 2021-09-20 MED ORDER — PROSOURCE TF PO LIQD: 45.0000 mL | Freq: Two times a day (BID) | ORAL | Status: DC

## 2021-09-20 MED ORDER — ASPIRIN 81 MG PO CHEW
81.0000 mg | CHEWABLE_TABLET | Freq: Every day | ORAL | Status: DC
  Administered 2021-09-21: 81 mg
  Filled 2021-09-20: qty 1

## 2021-09-20 MED ORDER — POTASSIUM CHLORIDE 20 MEQ PO PACK
40.0000 meq | PACK | ORAL | Status: AC
  Administered 2021-09-20 (×2): 40 meq
  Filled 2021-09-20 (×2): qty 2
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
## 2021-09-20 NOTE — Progress Notes (Signed)
Palliative-  ? ?Spoke with patient's daughterManuela Ho. Family is meeting with [name] today at [facility].  ? ?Planned [place] meeting tomorrow 4/6 at 11am.  ? ?[name], AGNP-C ?Palliative Medicine ? ?No charge ? ?Please call Palliative Medicine team phone with any questions [phone]. ?For individual providers please see AMION. ? ?

## 2021-09-20 NOTE — Plan of Care (Signed)
?  Problem: Education: ?Goal: Ability to verbalize activity precautions or restrictions will improve ?Outcome: Progressing ?  ?Problem: Activity: ?Goal: Will remain free from falls ?Outcome: Progressing ?  ?Problem: Pain Management: ?Goal: Pain level will decrease ?Outcome: Progressing ?  ?Problem: Skin Integrity: ?Goal: Will show signs of wound healing ?Outcome: Progressing ?  ?Problem: Bladder/Genitourinary: ?Goal: Urinary functional status for postoperative course will improve ?Outcome: Progressing ?  ?Problem: Safety: ?Goal: Non-violent Restraint(s) ?Outcome: Progressing ?  ?Problem: Education: ?Goal: Knowledge of disease or condition will improve ?Outcome: Progressing ?Goal: Knowledge of secondary prevention will improve (SELECT ALL) ?Outcome: Progressing ?Goal: Knowledge of patient specific risk factors will improve (INDIVIDUALIZE FOR PATIENT) ?Outcome: Progressing ?Goal: Individualized Educational Video(s) ?Outcome: Progressing ?  ?

## 2021-09-20 NOTE — Progress Notes (Signed)
Physical Therapy Treatment ?Patient Details ?Name: Anthony Ho ?MRN: [id number] ?DOB: [birth year] ?Today's Date: 09/20/2021 ? ? ?History of Present Illness 88 y.o. male presents to [facility] on 10/11/2021 from [facility] after fall out of bed. Pt found to have C6 fx, R vertebral artery occlusion at C2, C7 TP fx, C3-5 SP fxs, T2 compression fx, sternal body fx, L rib 2 fx, SDH from foramen magnum to T1. 4/4 pt found to have R cerebellar and L temporal lobe subacute infarcts. PMH includes A. Fib on Eliquis, DM2, HTN, HLD, OSA on CPAP, bladder CA s/p resection. ? ?  ?PT Comments  ? ? Pt with increased restlessness this session, demonstrating good strength in all extremities and often resisting PT during mobility. Pt is unable to follow commands at this time and does not verbally communicate with PT other than moaning and crying out. Pt is at a high risk for falls due to seemingly impaired awareness of deficits and situation. Pt will benefit from continued attempts at mobilization in an effort to improve mobility quality and balance, however if the pt remains unable to follow commands progress is likely to be slow and limited. PT updates recommendations to SNF due to poor command following and poor tolerance.  ?Recommendations for follow up therapy are one component of a multi-disciplinary discharge planning process, led by the attending physician.  Recommendations may be updated based on patient status, additional functional criteria and insurance authorization. ? ?Follow Up Recommendations ? Skilled nursing-short term rehab (<3 hours/day) ?  ?  ?Assistance Recommended at Discharge Frequent or constant Supervision/Assistance  ?Patient can return home with the following Two people to help with walking and/or transfers;Two people to help with bathing/dressing/bathroom;Assistance with cooking/housework;Assistance with feeding;Direct supervision/assist for medications management;Direct supervision/assist for financial  management;Assist for transportation;Help with stairs or ramp for entrance ?  ?Equipment Recommendations ? Wheelchair cushion (measurements PT);Wheelchair (measurements PT);Hospital bed;Other (comment) (hoyer lift)  ?  ?Recommendations for Other Services   ? ? ?  ?Precautions / Restrictions Precautions ?Precautions: Fall ?Precaution Comments: NG tube, C collar, 4 point restraits with posey belt ?Required Braces or Orthoses: Cervical Brace ?Cervical Brace: Hard collar;At all times ?Restrictions ?Weight Bearing Restrictions: No  ?  ? ?Mobility ? Bed Mobility ?Overal bed mobility: Needs Assistance ?Bed Mobility: Rolling, Sit to Supine, Supine to Sit ?Rolling: Total assist ?  ?Supine to sit: +2 for physical assistance, Total assist ?Sit to supine: Mod assist (pt initiates return to supine, PT assists in maintaining safety and lines, repositioning in bed) ?  ?General bed mobility comments: pt requries significant assistance to obtain sitting position, resisting PT at times ?  ? ?Transfers ?Overall transfer level:  (deferred 2/2 safety concerns) ?  ?  ?  ?  ?  ?  ?  ?  ?  ?  ? ?Ambulation/Gait ?  ?  ?  ?  ?  ?  ?  ?  ? ? ?Stairs ?  ?  ?  ?  ?  ? ? ?Wheelchair Mobility ?  ? ?Modified Rankin (Stroke Patients Only) ?Modified Rankin (Stroke Patients Only) ?Pre-Morbid Rankin Score: Slight disability ?Modified Rankin: Severe disability ? ? ?  ?Balance Overall balance assessment: Needs assistance ?Sitting-balance support: Single extremity supported, Bilateral upper extremity supported, Feet supported ?Sitting balance-Leahy Scale: Poor ?Sitting balance - Comments: maxA initially progressing to minA at times, pt very restless and moving from side to side, often descending onto R elbow. ?Postural control: Right lateral lean ?  ?  ?  ?  ?  ?  ?  ?  ?  ?  ?  ?  ?  ?  ?  ? ?  ?  Cognition Arousal/Alertness: Awake/alert ?Behavior During Therapy: Restless, Impulsive ?Overall Cognitive Status: Difficult to assess ?  ?  ?  ?  ?  ?  ?  ?  ?   ?  ?  ?  ?  ?  ?  ?  ?General Comments: pt does not follow commands, pt is impulsive and demonstrates reduced awareness of situation and deficits. No signs of pt understanding of PT communication ?  ?  ? ?  ?Exercises   ? ?  ?General Comments General comments (skin integrity, edema, etc.): VSS on 4L [place] ?  ?  ? ?Pertinent Vitals/Pain Pain Assessment ?Pain Assessment: CPOT ?Breathing: occasional labored breathing, short period of hyperventilation ?Negative Vocalization: repeated troubled calling out, loud moaning/groaning, crying ?Facial Expression: sad, frightened, frown ?Body Language: rigid, fists clenched, knees up, pushing/pulling away, strikes out ?Consolability: distracted or reassured by voice/touch ?PAINAD Score: 7 ?Facial Expression: Tense ?Body Movements: Restlessness ?Muscle Tension: Very tense or rigid ?Compliance with ventilator (intubated pts.): N/A ?Vocalization (extubated pts.): Crying out, sobbing ?CPOT Total: 7 ?Pain Intervention(s): Monitored during session  ? ? ?Home Living   ?  ?  ?  ?  ?  ?  ?  ?  ?  ?   ?  ?Prior Function    ?  ?  ?   ? ?PT Goals (current goals can now be found in the care plan section) Acute Rehab PT Goals ?Patient Stated Goal: to reduce caregiver burden and improve functional mobility quality ?Progress towards PT goals: Not progressing toward goals - comment (unable to follow commands, increased restlessness) ? ?  ?Frequency ? ? ? Min 3X/week ? ? ? ?  ?PT Plan Discharge plan needs to be updated;Frequency needs to be updated  ? ? ?Co-evaluation   ?  ?  ?  ?  ? ?  ?AM-PAC PT "6 Clicks" Mobility   ?Outcome Measure ? Help needed turning from your back to your side while in a flat bed without using bedrails?: Total ?Help needed moving from lying on your back to sitting on the side of a flat bed without using bedrails?: Total ?Help needed moving to and from a bed to a chair (including a wheelchair)?: Total ?Help needed standing up from a chair using your arms (e.g., wheelchair or  bedside chair)?: Total ?Help needed to walk in hospital room?: Total ?Help needed climbing 3-5 steps with a railing? : Total ?6 Click Score: 6 ? ?  ?End of Session Equipment Utilized During Treatment: Cervical collar ?Activity Tolerance: Treatment limited secondary to agitation (restlessness) ?Patient left: in bed;with call bell/phone within reach;with bed alarm set;with family/visitor present;with restraints reapplied ?Nurse Communication: Mobility status;Need for lift equipment ?PT Visit Diagnosis: Other abnormalities of gait and mobility (R26.89);Muscle weakness (generalized) (M62.81);Other symptoms and signs involving the nervous system (R29.898) ?  ? ? ?Time: 1062-6948 ?PT Time Calculation (min) (ACUTE ONLY): 31 min ? ?Charges:  $Therapeutic Activity: 23-37 mins          ?          ? ?Zenaida Niece, PT, DPT ?Acute Rehabilitation ?Pager: [phone] ?Office [phone] ? ? ? ?Zenaida Niece ?09/20/2021, 5:31 PM ? ?
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
## 2021-09-20 NOTE — Progress Notes (Signed)
Trauma Event Note ? ? ? ?TRN at bedside to round, assisted with repositioning. Remains in four point restraints and waist belt for agitation, precedex continues. Addendum to DNR code status today by Dr. Grandville Silos, patient's family requests intubation if respiratory status declines. Family at bedside. Checked in with primary nurse, no needs at this time. ? ?Last imported Vital Signs ?BP (!) 180/73   Pulse (!) 106   Temp 99.9 ?F (37.7 ?C) (Axillary)   Resp (!) 23   Ht '5\' 10"'$  (1.778 m)   Wt 188 lb 0.8 oz (85.3 kg)   SpO2 (!) 87%   BMI 26.98 kg/m?  ? ?Trending CBC ?Recent Labs  ?  09/18/21 ?0256 09/19/21 ?3212 09/20/21 ?0210  ?WBC 13.0* 13.7* 14.0*  ?HGB 14.8 13.7 13.6  ?HCT 44.9 41.6 40.0  ?PLT 110* 92* 106*  ? ? ?Trending Coag's ?No results for input(s): APTT, INR in the last 72 hours. ? ?Trending BMET ?Recent Labs  ?  09/18/21 ?0256 09/19/21 ?2482 09/20/21 ?0210  ?NA 141 140 143  ?K 3.9 3.6 3.0*  ?CL 106 107 109  ?CO2 '26 23 26  '$ ?BUN 17 23 30*  ?CREATININE 1.76* 2.02* 1.93*  ?GLUCOSE 152* 149* 133*  ? ? ? ? ?[place]  ?Trauma Response RN ? ?Please call TRN at [phone] for further assistance. ? ? ?  ?

## 2021-09-20 NOTE — Progress Notes (Addendum)
Patient ID: Anthony Ho, male   DOB: 08-09-1931, 88 y.o.   MRN: [id number] ?Follow up - Trauma Critical Care ?  ?Patient Details:  ?  ?Anthony Ho is an 88 y.o. male. ? ?Lines/tubes ?: ?Urethral Catheter Renee Ramus RN Latex 14 Fr. (Active)  ?Indication for Insertion or Continuance of Catheter Unstable spinal/crush injuries / Multisystem Trauma 09/20/21 0723  ?Site Assessment Clean;Clean, Dry, Intact;Dry 09/20/21 0723  ?Catheter Maintenance Bag below level of bladder;Catheter secured;Drainage bag/tubing not touching floor;No dependent loops;Insertion date on drainage bag;Seal intact 09/20/21 0723  ?Collection Container Standard drainage bag 09/20/21 0723  ?Securement Method Securing device (Describe) 09/20/21 0723  ?Urinary Catheter Interventions (if applicable) Unclamped 43/32/95 0723  ?Output (mL) 90 mL 09/20/21 0700  ? ? ?Microbiology/Sepsis markers: ?Results for orders placed or performed during the hospital encounter of 09/25/2021  ?MRSA Next Gen by PCR, Nasal     Status: None  ? Collection Time: 09/30/2021  8:59 PM  ? Specimen: Nasal Mucosa; Nasal Swab  ?Result Value Ref Range Status  ? MRSA by PCR Next Gen NOT DETECTED NOT DETECTED Final  ?  Comment: (NOTE) ?The GeneXpert MRSA Assay (FDA approved for NASAL specimens only), ?is one component of a comprehensive MRSA colonization surveillance ?program. It is not intended to diagnose MRSA infection nor to guide ?or monitor treatment for MRSA infections. ?Test performance is not FDA approved in patients less than 2 years ?old. ?Performed at [facility], [place] [address] [place], [place] ?[address] ?  ?Urine Culture     Status: None  ? Collection Time: 09/18/21 11:17 AM  ? Specimen: Urine, Clean Catch  ?Result Value Ref Range Status  ? Specimen Description URINE, CLEAN CATCH  Final  ? Special Requests NONE  Final  ? Culture   Final  ?  NO GROWTH ?Performed at [facility], [place] [address] [place], [place] [address] ?  ? Report Status 09/19/2021 FINAL   Final  ? ? ?Anti-infectives:  ?Anti-infectives (From admission, onward)  ? ? None  ? ?  ?Consults: ?Treatment Team:  ?Eustace Moore, MD  ? ? ?Studies: ? ? ? ?Events: ? ?Subjective:  ?  ?Overnight Issues:  ? ?Objective:  ?Vital signs for last 24 hours: ?Temp:  [98.4 ?F (36.9 ?C)-100.1 ?F (37.8 ?C)] 100.1 ?F (37.8 ?C) (04/06 0400) ?Pulse Rate:  [63-86] 69 (04/06 0800) ?Resp:  [6-26] 16 (04/06 0800) ?BP: (125-174)/(59-94) 165/70 (04/06 0800) ?SpO2:  [89 %-98 %] 93 % (04/06 0800) ? ?Hemodynamic parameters for last 24 hours: ?  ? ?Intake/Output from previous day: ?04/05 0701 - 04/06 0700 ?In: 2106.8 [I.V.:2106.8] ?Out: 2065 [Urine:2005; Emesis/NG output:60]  ?Intake/Output this shift: ?Total I/O ?In: 89.8 [I.V.:89.8] ?Out: -  ? ?Vent settings for last 24 hours: ?  ? ?Physical Exam:  ?General: calm ?Neuro: moans to stim, not f/c ?HEENT/Neck: collar ?Resp: rhonchi bilaterally ?CVS: IRR ?GI: soft, nontender, BS WNL, no r/g ?Extremities: mild edema ? ?Results for orders placed or performed during the hospital encounter of 09/24/2021 (from the past 24 hour(s))  ?Glucose, capillary     Status: Abnormal  ? Collection Time: 09/19/21 12:15 PM  ?Result Value Ref Range  ? Glucose-Capillary 128 (H) 70 - 99 mg/dL  ?Glucose, capillary     Status: Abnormal  ? Collection Time: 09/19/21  3:26 PM  ?Result Value Ref Range  ? Glucose-Capillary 172 (H) 70 - 99 mg/dL  ?Glucose, capillary     Status: Abnormal  ? Collection Time: 09/19/21  8:14 PM  ?Result Value Ref Range  ? Glucose-Capillary 170 (H) 70 - 99 mg/dL  ?Glucose, capillary     Status: Abnormal  ? Collection Time: 09/19/21 11:38 PM  ?Result Value Ref Range  ? Glucose-Capillary 183 (H) 70 - 99 mg/dL  ?CBC     Status: Abnormal  ? Collection Time: 09/20/21  2:10 AM  ?Result Value Ref Range  ? WBC 14.0 (H) 4.0 - 10.5 K/uL  ? RBC 4.88 4.22 - 5.81 MIL/uL  ? Hemoglobin 13.6 13.0 - 17.0 g/dL  ? HCT 40.0 39.0 - 52.0 %  ? MCV 82.0 80.0 - 100.0 fL  ? MCH 27.9 26.0 - 34.0 pg  ? MCHC 34.0 30.0 -  36.0 g/dL  ? RDW 14.9 11.5 - 15.5 %  ? Platelets 106 (L) 150 - 400 K/uL  ? nRBC 0.0 0.0 - 0.2 %  ?Basic metabolic panel     Status: Abnormal  ? Collection Time: 09/20/21  2:10 AM  ?Result Value Ref Range  ? Sodium 143 135 - 145 mmol/L  ? Potassium 3.0 (L) 3.5 - 5.1 mmol/L  ? Chloride 109 98 - 111 mmol/L  ? CO2 26 22 - 32 mmol/L  ? Glucose, Bld 133 (H) 70 - 99 mg/dL  ? BUN 30 (H) 8 - 23 mg/dL  ? Creatinine, Ser 1.93 (H) 0.61 - 1.24 mg/dL  ? Calcium 8.2 (L) 8.9 - 10.3 mg/dL  ? GFR, Estimated 33 (L) >60 mL/min  ? Anion gap 8 5 - 15  ?Glucose, capillary     Status: Abnormal  ? Collection Time: 09/20/21  3:38 AM  ?Result Value Ref Range  ? Glucose-Capillary 139 (H) 70 - 99 mg/dL  ?Glucose, capillary     Status: Abnormal  ? Collection Time: 09/20/21  7:27 AM  ?Result Value Ref Range  ? Glucose-Capillary 143 (H) 70 - 99 mg/dL  ? ? ?Assessment & Plan: ?Present on Admission: ? Cervical spine fracture ([place]) ? ? ? LOS: 3 days  ? ?Additional comments:I reviewed the patient's new clinical lab test results. / ?Fall out of bed ? ?Sternal fx w/ mediastinal hemoatoma - Pain control. Cardiac monitoring ?L 2nd rib fx - no ptx on CT or CXR 4/4 ?C6 Fx w/ blood around cord- Per NSGY. Had L hemiplegia initially that has since resolved. MRI w/ subdural hematoma throughout the cervical region from the foramen magnum to the level of T1 effacing the subarachnoid space surrounding the cord. Eliquis reversed with adnexxa. NSGY recommending no surgical intervention and if patient were to become quadriplegic, comfort care. PT/OT ?C7 TP fx, C3-C5 SP fxs - Per NSGY. Multimodal pain control. continue c-collar ?T2 compression fx - per NSGY, non-op ?R VA Injury - vertebral artery occlusion at the level of C2 (common carotid, internal carotid and left vertebral arteries are patent). [id number] ?A. Fib on Eliquis - reversed. I d/w primary cardiologist, Dr. Ardis Hughs and recs for permanent discontinuation of Eliquis from a cardiac standpoint.  ?L temporal and R  cerebellar infarcts - seen on CT 4/4. Neurology c/s, Dr. Cheral Marker, MRA completed and negative. Suspect stroke is 2/2 vert injury. [id number] started 4/4. Eliquis will not be resumed.  ?DM2 - SSI ?HTN - Home meds + PRN meds.  ?HLD  ?CKD 3b - stable ?OSA on CPAP - CPAP at night ?CV - echo with EF 35-40% and wall motion ABN. I consulted Cardiology. ?Agitation - Soft restraints, D/C seroquel due to QT prolongation, wean Precedex as able, spiritus frumenti ?FEN - SLP  eval, cortrak, IVF, bowel regimen, replete hypokalemia, check Mg and phos, lasix IV scheduled ?VTE - SCDs, hold anticoagulation and chemical prophylaxis till cleared by NSGY ?Dispo - ICU, PT/OT ?I spoke with two of his daughters at the bedside. He is at significant risk of PNA. They do want to proceed with intubation if resp status declines. I annotated his DNR order. ?Critical Care Total Time*: 37 Minutes ? ?Georganna Skeans, MD, MPH, FACS ?Trauma & General Surgery ?Use [email or web address] to contact on call provider ? ?09/20/2021 ? ?*Care during the described time interval was provided by me. I have reviewed this patient's available data, including medical history, events of note, physical examination and test results as part of my evaluation. ? ?  ?

## 2021-09-21 ENCOUNTER — Encounter (HOSPITAL_COMMUNITY): Payer: Self-pay | Admitting: General Surgery

## 2021-09-21 DIAGNOSIS — I63211 Cerebral infarction due to unspecified occlusion or stenosis of right vertebral arteries: Secondary | ICD-10-CM

## 2021-09-21 DIAGNOSIS — S15101A Unspecified injury of right vertebral artery, initial encounter: Secondary | ICD-10-CM

## 2021-09-21 DIAGNOSIS — Z515 Encounter for palliative care: Secondary | ICD-10-CM | POA: Diagnosis not present

## 2021-09-21 DIAGNOSIS — Z7189 Other specified counseling: Secondary | ICD-10-CM | POA: Diagnosis not present

## 2021-09-21 DIAGNOSIS — R0989 Other specified symptoms and signs involving the circulatory and respiratory systems: Secondary | ICD-10-CM | POA: Diagnosis not present

## 2021-09-21 DIAGNOSIS — S129XXA Fracture of neck, unspecified, initial encounter: Secondary | ICD-10-CM | POA: Diagnosis not present

## 2021-09-21 DIAGNOSIS — I4821 Permanent atrial fibrillation: Secondary | ICD-10-CM | POA: Diagnosis not present

## 2021-09-21 DIAGNOSIS — I10 Essential (primary) hypertension: Secondary | ICD-10-CM | POA: Diagnosis not present

## 2021-09-21 LAB — CBC
HCT: 43.5 % (ref 39.0–52.0)
Hemoglobin: 14.3 g/dL (ref 13.0–17.0)
MCH: 27.2 pg (ref 26.0–34.0)
MCHC: 32.9 g/dL (ref 30.0–36.0)
MCV: 82.9 fL (ref 80.0–100.0)
Platelets: 127 10*3/uL — ABNORMAL LOW (ref 150–400)
RBC: 5.25 MIL/uL (ref 4.22–5.81)
RDW: 15.1 % (ref 11.5–15.5)
WBC: 12.5 10*3/uL — ABNORMAL HIGH (ref 4.0–10.5)
nRBC: 0 % (ref 0.0–0.2)

## 2021-09-21 LAB — BASIC METABOLIC PANEL
Anion gap: 10 (ref 5–15)
BUN: 41 mg/dL — ABNORMAL HIGH (ref 8–23)
CO2: 25 mmol/L (ref 22–32)
Calcium: 8.6 mg/dL — ABNORMAL LOW (ref 8.9–10.3)
Chloride: 113 mmol/L — ABNORMAL HIGH (ref 98–111)
Creatinine, Ser: 1.81 mg/dL — ABNORMAL HIGH (ref 0.61–1.24)
GFR, Estimated: 36 mL/min — ABNORMAL LOW (ref >60)
Glucose, Bld: 267 mg/dL — ABNORMAL HIGH (ref 70–99)
Potassium: 3.4 mmol/L — ABNORMAL LOW (ref 3.5–5.1)
Sodium: 148 mmol/L — ABNORMAL HIGH (ref 135–145)

## 2021-09-21 LAB — GLUCOSE, CAPILLARY
Glucose-Capillary: 293 mg/dL — ABNORMAL HIGH (ref 70–99)
Glucose-Capillary: 201 mg/dL — ABNORMAL HIGH (ref 70–99)
Glucose-Capillary: 279 mg/dL — ABNORMAL HIGH (ref 70–99)

## 2021-09-21 MED ORDER — GLYCOPYRROLATE 0.2 MG/ML IJ SOLN: 0.2000 mg | INTRAMUSCULAR | Status: DC | PRN

## 2021-09-21 MED ORDER — GLYCOPYRROLATE 1 MG PO TABS
1.0000 mg | ORAL_TABLET | ORAL | Status: DC | PRN
  Administered 2021-09-21: 1 mg via ORAL
  Filled 2021-09-21 (×2): qty 1

## 2021-09-21 MED ORDER — LORAZEPAM BOLUS VIA INFUSION
1.0000 mg | INTRAVENOUS | Status: DC | PRN
  Administered 2021-09-21 (×2): 1 mg via INTRAVENOUS
  Filled 2021-09-21: qty 1

## 2021-09-21 MED ORDER — LORAZEPAM 2 MG/ML IJ SOLN
2.0000 mg | INTRAMUSCULAR | Status: AC
  Administered 2021-09-21: 2 mg via INTRAVENOUS
  Filled 2021-09-21: qty 1

## 2021-09-21 MED ORDER — LORAZEPAM 2 MG/ML IJ SOLN
0.5000 mg/h | INTRAVENOUS | Status: DC
  Administered 2021-09-21: 0.5 mg/h via INTRAVENOUS
  Filled 2021-09-21: qty 25

## 2021-09-21 MED ORDER — GLYCOPYRROLATE 0.2 MG/ML IJ SOLN
0.2000 mg | INTRAMUSCULAR | Status: DC | PRN
  Administered 2021-09-21: 0.2 mg via INTRAVENOUS
  Filled 2021-09-21: qty 1

## 2021-09-21 MED ORDER — MORPHINE 100MG IN NS 100ML (1MG/ML) PREMIX INFUSION
1.0000 mg/h | INTRAVENOUS | Status: DC
  Administered 2021-09-21: 1 mg/h via INTRAVENOUS
  Filled 2021-09-21: qty 100

## 2021-09-21 MED ORDER — GUAIFENESIN ER 600 MG PO TB12
1200.0000 mg | ORAL_TABLET | Freq: Two times a day (BID) | ORAL | Status: DC
  Filled 2021-09-21: qty 2

## 2021-09-21 MED ORDER — MORPHINE BOLUS VIA INFUSION
2.0000 mg | INTRAVENOUS | Status: DC | PRN
  Administered 2021-09-21 (×4): 2 mg via INTRAVENOUS
  Filled 2021-09-21: qty 2

## 2021-09-21 MED ORDER — GUAIFENESIN 100 MG/5ML PO LIQD
5.0000 mL | ORAL | Status: DC
  Administered 2021-09-21 – 2021-09-22 (×4): 5 mL
  Filled 2021-09-21 (×3): qty 5
  Filled 2021-09-21: qty 15
  Filled 2021-09-21: qty 5
  Filled 2021-09-21: qty 15
  Filled 2021-09-21: qty 5

## 2021-09-21 MED ORDER — ATROPINE SULFATE 1 % OP SOLN
3.0000 [drp] | OPHTHALMIC | Status: DC | PRN
  Filled 2021-09-21: qty 2

## 2021-09-21 MED ORDER — ALBUTEROL SULFATE (2.5 MG/3ML) 0.083% IN NEBU
2.5000 mg | INHALATION_SOLUTION | RESPIRATORY_TRACT | Status: DC | PRN
  Administered 2021-09-21 (×2): 2.5 mg via RESPIRATORY_TRACT
  Filled 2021-09-21 (×2): qty 3

## 2021-09-21 MED ORDER — DIPHENHYDRAMINE HCL 50 MG/ML IJ SOLN
25.0000 mg | Freq: Four times a day (QID) | INTRAMUSCULAR | Status: DC
  Administered 2021-09-21 – 2021-09-22 (×3): 25 mg via INTRAVENOUS
  Filled 2021-09-21 (×3): qty 1

## 2021-09-21 NOTE — Progress Notes (Signed)
? ?Progress Note ? ?Patient Name: Anthony Ho ?Date of Encounter: 09/21/2021 ? ?Primary Cardiologist: Anthony Martinique, MD  ? ?Subjective  ? ?Patient seen examined his bedside.  Daughter was at the bedside. ? ?Inpatient Medications  ?  ?Scheduled Meds: ? acetaminophen  650 mg Per Tube Q6H  ? amLODipine  10 mg Per Tube Daily  ? aspirin  81 mg Per Tube Daily  ? Chlorhexidine Gluconate Cloth  6 each Topical Daily  ? docusate  100 mg Per Tube BID  ? doxazosin  2 mg Per Tube QHS  ? folic acid  1 mg Per Tube Daily  ? furosemide  80 mg Intravenous Daily  ? gabapentin  600 mg Per Tube QHS  ? guaiFENesin  5 mL Per Tube Q4H  ? insulin aspart  0-15 Units Subcutaneous Q4H  ? multivitamin with minerals  1 tablet Per Tube Daily  ? pantoprazole sodium  40 mg Per Tube Daily  ? polyethylene glycol  17 g Per Tube Daily  ? pravastatin  40 mg Per Tube QHS  ? spiritus frumenti  1 each Per Tube QPM  ? thiamine  100 mg Intravenous Daily  ? ?Continuous Infusions: ? dexmedetomidine (PRECEDEX) IV infusion 0.6 mcg/kg/hr (09/21/21 1008)  ? feeding supplement (PIVOT 1.5 CAL) 1,000 mL (09/20/21 0954)  ? lactated ringers 75 mL/hr at 09/21/21 0830  ? ?PRN Meds: ?albuterol, methocarbamol, metoprolol tartrate, morphine injection, ondansetron **OR** ondansetron (ZOFRAN) IV, oxyCODONE  ? ?Vital Signs  ?  ?Vitals:  ? 09/21/21 0330 09/21/21 0400 09/21/21 0800 09/21/21 0845  ?BP: (!) 169/78 (!) 146/69 (!) 182/78 (!) 173/91  ?Pulse: (!) 102 84 95   ?Resp:      ?Temp:  98.8 ?F (37.1 ?C) 97.8 ?F (36.6 ?C)   ?TempSrc:  Oral Oral   ?SpO2: 91% 93% (!) 88%   ?Weight:      ?Height:      ? ? ?Intake/Output Summary (Last 24 hours) at 09/21/2021 1118 ?Last data filed at 09/21/2021 0314 ?Gross per 24 hour  ?Intake 1748.05 ml  ?Output 2350 ml  ?Net -601.95 ml  ? ?Filed Weights  ? 10/01/2021 0957 10/06/2021 2030  ?Weight: 90.7 kg 85.3 kg  ? ? ?Telemetry  ?  ?Atrial fibrillatiom - Personally Reviewed ? ?ECG  ?  ?none - Personally Reviewed ? ?Physical Exam  ? ?  ?General: agitated  and confused ?Head: Atraumatic, normal size  ?Eyes: PEERLA, EOMI  ?Neck: Supple, normal JVD ?Cardiac: Normal S1, S2; RRR; no murmurs, rubs, or gallops ?Lungs: Clear to auscultation bilaterally ?Abd: Soft, nontender, no hepatomegaly  ?Ext: warm, no edema ?Musculoskeletal: No deformities, BUE and BLE strength normal and equal ?Skin: Warm and dry, no rashes   ?Neuro: unable to assess ?Psych: Normal mood and affect  ? ?Labs  ?  ?Chemistry ?Recent Labs  ?Lab 09/19/21 ?1950 09/20/21 ?0210 09/21/21 ?9326  ?NA 140 143 148*  ?K 3.6 3.0* 3.4*  ?CL 107 109 113*  ?CO2 '23 26 25  '$ ?GLUCOSE 149* 133* 267*  ?BUN 23 30* 41*  ?CREATININE 2.02* 1.93* 1.81*  ?CALCIUM 8.3* 8.2* 8.6*  ?GFRNONAA 31* 33* 36*  ?ANIONGAP '10 8 10  '$ ?  ? ?Hematology ?Recent Labs  ?Lab 09/19/21 ?7124 09/20/21 ?0210 09/21/21 ?5809  ?WBC 13.7* 14.0* 12.5*  ?RBC 4.96 4.88 5.25  ?HGB 13.7 13.6 14.3  ?HCT 41.6 40.0 43.5  ?MCV 83.9 82.0 82.9  ?MCH 27.6 27.9 27.2  ?MCHC 32.9 34.0 32.9  ?RDW 15.0 14.9 15.1  ?PLT 92* 106*  127*  ? ? ?Cardiac EnzymesNo results for input(s): TROPONINI in the last 168 hours. No results for input(s): TROPIPOC in the last 168 hours.  ? ?BNPNo results for input(s): BNP, PROBNP in the last 168 hours.  ? ?DDimer No results for input(s): DDIMER in the last 168 hours.  ? ?Radiology  ?  ?CT HEAD WO CONTRAST (5MM) ? ?Result Date: 09/20/2021 ?CLINICAL DATA:  Altered mental status, status post fall. EXAM: CT HEAD WITHOUT CONTRAST TECHNIQUE: Contiguous axial images were obtained from the base of the skull through the vertex without intravenous contrast. RADIATION DOSE REDUCTION: This exam was performed according to the departmental dose-optimization program which includes automated exposure control, adjustment of the mA and/or kV according to patient size and/or use of iterative reconstruction technique. COMPARISON:  September 18, 2021. FINDINGS: Brain: Large low density area is seen involving the left parietal cortex concerning for acute infarction. Increased  low density area is noted in right occipital region concerning for acute infarction. Stable low-density area in right cerebellar hemisphere concerning for acute infarction. No definite hemorrhage is noted. No midline shift is noted. Ventricular size is within normal limits. Vascular: No hyperdense vessel or unexpected calcification. Skull: Normal. Negative for fracture or focal lesion. Sinuses/Orbits: No acute finding. Other: None. IMPRESSION: Continued presence of cortical low densities involving the left parietal cortex, right occipital lobe and right cerebellar hemisphere consistent with acute infarctions. No definite hemorrhage is noted. Electronically Signed   By: Marijo Conception M.D.   On: 09/20/2021 12:51  ? ?DG Abd Portable 1V ? ?Result Date: 09/19/2021 ?CLINICAL DATA:  Feeding tube placement EXAM: PORTABLE ABDOMEN - 1 VIEW COMPARISON:  None. FINDINGS: Feeding tube tip projects over the expected area of the distal stomach. Visualized bowel gas pattern is normal. No radio-opaque calculi or other significant radiographic abnormality are seen. IMPRESSION: Feeding tube tip projects over the expected area of the distal stomach. Electronically Signed   By: Yetta Glassman M.D.   On: 09/19/2021 15:09  ? ?ECHOCARDIOGRAM COMPLETE ? ?Result Date: 09/19/2021 ?   ECHOCARDIOGRAM REPORT   Patient Name:   Anthony Ho Date of Exam: 09/19/2021 Medical Rec #:  [id number]    Height:       70.0 in Accession #:    [id number]   Weight:       188.1 lb Date of Birth:  Aug 02, 1931    BSA:          2.033 m? Patient Age:    63 years     BP:           174/86 mmHg Patient Gender: M            HR:           70 bpm. Exam Location:  Inpatient Procedure: 2D Echo, Cardiac Doppler, Color Doppler and Intracardiac            Opacification Agent Indications:    Stroke  History:        Patient has prior history of Echocardiogram examinations, most                 recent 08/26/2019. CHF, Arrythmias:Atrial Fibrillation; Risk                  Factors:Hypertension, Dyslipidemia, Diabetes, Sleep Apnea and                 Current Smoker. CKD.  Sonographer:    Clayton Lefort RDCS (AE) Referring Phys: [id number] [place]  1. No left ventricular thrombus is seen (Definity was used). Wall motion abnormalities are consistent with infarction in the distribution of the mid-LAD artery, but there may also be involvement in the PDA territory. Left ventricular ejection fraction, by estimation, is 35 to 40%. The left ventricle has moderately decreased function. The left ventricle has no regional wall motion abnormalities. There is mild concentric left ventricular hypertrophy. Left ventricular diastolic function could not be evaluated.  2. Right ventricular systolic function is normal. The right ventricular size is normal. There is moderately elevated pulmonary artery systolic pressure.  3. Left atrial size was severely dilated.  4. Right atrial size was severely dilated.  5. The mitral valve is normal in structure. Mild mitral valve regurgitation. Moderate mitral annular calcification.  6. Tricuspid valve regurgitation is moderate.  7. The aortic valve is tricuspid. There is mild calcification of the aortic valve. There is mild thickening of the aortic valve. Aortic valve regurgitation is trivial. Aortic valve sclerosis/calcification is present, without any evidence of aortic stenosis.  8. There is mild dilatation of the ascending aorta, measuring 41 mm.  9. The inferior vena cava is dilated in size with <50% respiratory variability, suggesting right atrial pressure of 15 mmHg. Comparison(s): The left ventricular function is significantly worse. The left ventricular wall motion abnormalities are new. FINDINGS  Left Ventricle: No left ventricular thrombus is seen (Definity was used). Wall motion abnormalities are consistent with infarction in the distribution of the mid-LAD artery, but there may also be involvement in the PDA territory. Left ventricular  ejection fraction, by estimation, is 35 to 40%. The left ventricle has moderately decreased function. The left ventricle has no regional wall motion abnormalities. Definity contrast agent was given IV to delineate the left

## 2021-09-21 NOTE — Progress Notes (Signed)
PT Cancellation Note ? ?Patient Details ?Name: Anthony Ho ?MRN: [id number] ?DOB: [birth year] ? ? ?Cancelled Treatment:    Reason Eval/Treat Not Completed: Other (comment). Pt transitioning to comfort care focus. PT will sign off at this time. ? ? ?Zenaida Niece ?09/21/2021, 2:06 PM ?

## 2021-09-21 NOTE — Plan of Care (Signed)
?  Problem: Nutrition: ?Goal: Adequate nutrition will be maintained ?Outcome: Progressing ?  ?Problem: Elimination: ?Goal: Will not experience complications related to urinary retention ?Outcome: Progressing ?  ?Problem: Education: ?Goal: Knowledge of General Education information will improve ?Description: Including pain rating scale, medication(s)/side effects and non-pharmacologic comfort measures ?Outcome: Not Progressing ?  ?Problem: Health Behavior/Discharge Planning: ?Goal: Ability to manage health-related needs will improve ?Outcome: Not Progressing ?  ?Problem: Coping: ?Goal: Level of anxiety will decrease ?Outcome: Not Progressing ?  ?

## 2021-09-21 NOTE — Consult Note (Signed)
? ?                                                                                ?Consultation Note ?Date: 09/21/2021  ? ?Patient Name: Anthony Ho  ?DOB: [birth year]  MRN: [id number]  Age / Sex: 88 y.o., male  ?PCP: Lajean Manes, MD ?Referring Physician: Md, Trauma, MD ? ?Reason for Consultation:  goals of care ? ?HPI/Patient Profile: 88 y.o. male  with past medical history of mild dementia, a fib, frequent falls, Barrett's esophagus, DM2 admitted on 09/15/2021 after falling out of bed. Initial workup revealed multiple cervical spine fractures with epidural bleeding around his spinal column, vertebral artery injury and occlusion. Sternal fracture, rib fractures. Initially he was awake and alert, but then became more somnolent with intermittent agitation. Further workup shows multiple ischemic CVA's- CT on 4/4 indicated L temporal and R cerebellar, CT on 4/6 showed more strokes with new L parietal and R occipital. He has required sedation with precedex. He is not showing any improvements in his status. He is not a surgical candidate for his fractures or ischemic CVA's. He is not able to follow commands, eat, drink, or communicate. Palliative medicine consulted for [place].   ? ?Primary Decision Maker ?NEXT OF KIN- daughters Manuela Schwartz and Vaughan Basta are primary contacts and HCPOA- spouse- is present, but has mild dementia. Two other daughters were present, but are not involved in decision making.  ? ?Discussion: ?I have reviewed medical records including EPIC notes, labs and imaging, received report from bedisde RN, assessed the patient and then met with his family (first with Manuela Schwartz and Vaughan Basta, then at  a later time with Melida Gimenez, his spouse and two other daughters)  to discuss diagnosis prognosis, GOC, EOL wishes, disposition and options. ? ?I introduced Palliative Medicine as specialized medical care for people living with serious illness. It focuses on providing relief from the symptoms and stress of a serious illness. The  goal is to improve quality of life for both the patient and the family. ? ?We discussed a brief life review of the patient. He is known to be full of life, enjoyed spending time with his friend group. 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 They were living together in independent living at [facility] with support from their daughters.  ? ?As far as functional and nutritional status - prior to this incident he was independent with all ADL's. Eating well. He had started to have some memory deficits and was no longer driving.  ? ?We discussed patient's current illness and what it means in the larger context of patient's on-going co-morbidities.  Natural disease trajectory and expectations at EOL were discussed. ? ?I attempted to elicit values and goals of care important to the patient.   ? ?The difference between aggressive medical intervention and comfort care was considered in light of the patient's goals of care.  ? ?Advance directives, concepts specific to code status, artificial feeding and hydration, and rehospitalization were considered and discussed. ? ?Discussed with patient/family the importance of continued conversation with family and the medical providers regarding overall plan of care and treatment options, ensuring  decisions are within the context of the patient?s values and GOCs.   ? ?Hospice and Palliative Care services outpatient were explained and offered. ? ?Questions and concerns were addressed. The family was encouraged to call with questions or concerns.  ?  ? ?SUMMARY OF RECOMMENDATIONS ?-After lengthy discussion- decision made to eventually transition patient's care to comfort focus- family would like to continue tube feedings and IV fluids until other family members have visited tomorrow- in the mean time, no escalation of care, will also start more aggressive symptom management ?-Symptom management-  ? -Stop Precedex- give 2mg  IV lorazepam push  before stopping precedex to avoid increase in agitation ? -Lorazepam infusion - 0.5-2mg /hr- increase by .25 mg q15 mins until comfort is obtained, 1mg  IV bolus every 30 min for breakthrough agitation ? -Morphine infusion- 1-10mg /hr- start at 1mg , increase by 100% if more than 3 boluses are needed in one hour ? -Itching- he was on 50mg  Hydroxyzine at home for chronic itching- family is concerned he is continuing to itch- start benadryl 25mg  IV q6hrs for itching ? ?Code Status/Advance Care Planning: ?DNR ? ? ?Prognosis:   ?< 2 weeks ? ?Discharge Planning: To Be Determined ? ?Primary Diagnoses: ?Present on Admission: ? Cervical spine fracture ([place]) ? ? ?Review of Systems ? ?Physical Exam ? ?Vital Signs: BP (!) 148/76 (BP Location: Right Arm)   Pulse 97   Temp 97.8 ?F (36.6 ?C) (Axillary)   Resp (!) 29   Ht 5\' 10"  (1.778 m)   Wt 85.3 kg   SpO2 90%   BMI 26.98 kg/m?  ?Pain Scale: CPOT ?  ?Pain Score: Asleep ? ? ?SpO2: SpO2: 90 % ?O2 Device:SpO2: 90 % ?O2 Flow Rate: .O2 Flow Rate (L/min): 4 L/min ? ?IO: Intake/output summary:  ?Intake/Output Summary (Last 24 hours) at 09/21/2021 1340 ?Last data filed at 09/21/2021 1200 ?Gross per 24 hour  ?Intake 3365.87 ml  ?Output 2350 ml  ?Net 1015.87 ml  ? ? ?LBM: Last BM Date :  (PTA) ?Baseline Weight: Weight: 90.7 kg ?Most recent weight: Weight: 85.3 kg     ? ? ? ?Thank you for this consult. Palliative medicine will continue to follow and assist as needed.  ? ?Time Total: 180 minutes ? ? ?Signed by: ?[name], AGNP-C ?Palliative Medicine ? ?  ?Please contact Palliative Medicine Team phone at [phone] for questions and concerns.  ?For individual provider: See Amion ? ? ? ? ? ? ? ? ? ? ? ? ? ? ?

## 2021-09-21 NOTE — Progress Notes (Signed)
Noted pt had pulled urinary catheter out. Balloon remained inflated and intact. Pt cleansed, external catheter placed. Noted some blood on bed pad and sheet, very little at meatus. Will monitor pt for bleeding and possible urinary retention. Mitts placed bilat hands for added pt safety precaution.  ?

## 2021-09-21 NOTE — Progress Notes (Signed)
Patient ID: Anthony Ho, male   DOB: 1931/10/02, 88 y.o.   MRN: [id number] ?Follow up - Trauma Critical Care ?  ?Patient Details:  ?  ?Anthony Ho is an 88 y.o. male. ? ?Lines/tubes ?: ?Urethral Catheter Renee Ramus RN Latex 14 Fr. (Active)  ?Indication for Insertion or Continuance of Catheter Unstable spinal/crush injuries / Multisystem Trauma 09/20/21 0723  ?Site Assessment Clean;Clean, Dry, Intact;Dry 09/20/21 0723  ?Catheter Maintenance Bag below level of bladder;Catheter secured;Drainage bag/tubing not touching floor;No dependent loops;Insertion date on drainage bag;Seal intact 09/20/21 0723  ?Collection Container Standard drainage bag 09/20/21 0723  ?Securement Method Securing device (Describe) 09/20/21 0723  ?Urinary Catheter Interventions (if applicable) Unclamped 56/21/30 0723  ?Output (mL) 90 mL 09/20/21 0700  ? ? ?Microbiology/Sepsis markers: ?Results for orders placed or performed during the hospital encounter of 10/14/2021  ?MRSA Next Gen by PCR, Nasal     Status: None  ? Collection Time: 09/19/2021  8:59 PM  ? Specimen: Nasal Mucosa; Nasal Swab  ?Result Value Ref Range Status  ? MRSA by PCR Next Gen NOT DETECTED NOT DETECTED Final  ?  Comment: (NOTE) ?The GeneXpert MRSA Assay (FDA approved for NASAL specimens only), ?is one component of a comprehensive MRSA colonization surveillance ?program. It is not intended to diagnose MRSA infection nor to guide ?or monitor treatment for MRSA infections. ?Test performance is not FDA approved in patients less than 2 years ?old. ?Performed at [facility], [place] [address], [place], [place] ?[address] ?  ?Urine Culture     Status: None  ? Collection Time: 09/18/21 11:17 AM  ? Specimen: Urine, Clean Catch  ?Result Value Ref Range Status  ? Specimen Description URINE, CLEAN CATCH  Final  ? Special Requests NONE  Final  ? Culture   Final  ?  NO GROWTH ?Performed at [facility], [place] [address] [place], [place] [address] ?  ? Report Status 09/19/2021 FINAL   Final  ? ? ?Anti-infectives:  ?Anti-infectives (From admission, onward)  ? ? None  ? ?  ?Consults: ?Treatment Team:  ?Eustace Moore, MD  ? ? ?Studies: ? ? ? ?Events: ? ?Subjective:  ?  ?Overnight Issues:  ? ?Objective:  ?Vital signs for last 24 hours: ?Temp:  [97.4 ?F (36.3 ?C)-99.9 ?F (37.7 ?C)] 98.8 ?F (37.1 ?C) (04/07 0400) ?Pulse Rate:  [60-106] 84 (04/07 0400) ?Resp:  [9-25] 15 (04/06 2300) ?BP: (134-191)/(60-143) 146/69 (04/07 0400) ?SpO2:  [87 %-95 %] 93 % (04/07 0400) ? ?Hemodynamic parameters for last 24 hours: ?  ? ?Intake/Output from previous day: ?04/06 0701 - 04/07 0700 ?In: 2158 [I.V.:1612; NG/GT:546] ?Out: 2350 [Urine:2350]  ?Intake/Output this shift: ?No intake/output data recorded. ? ?Vent settings for last 24 hours: ?  ? ?Physical Exam:  ?General: calm ?Neuro: moans to stim, not f/c ?HEENT/Neck: collar ?Resp: rhonchi bilaterally ?CVS: IRR ?GI: soft, nontender, BS WNL, no r/g ?Extremities: mild edema ? ?Results for orders placed or performed during the hospital encounter of 09/27/2021 (from the past 24 hour(s))  ?Glucose, capillary     Status: Abnormal  ? Collection Time: 09/20/21 12:04 PM  ?Result Value Ref Range  ? Glucose-Capillary 210 (H) 70 - 99 mg/dL  ?Glucose, capillary     Status: Abnormal  ? Collection Time: 09/20/21  3:10 PM  ?Result Value Ref Range  ? Glucose-Capillary 204 (H) 70 - 99 mg/dL  ?Glucose, capillary     Status: Abnormal  ? Collection Time: 09/20/21  7:49 PM  ?Result Value  Ref Range  ? Glucose-Capillary 274 (H) 70 - 99 mg/dL  ?Glucose, capillary     Status: Abnormal  ? Collection Time: 09/20/21 11:45 PM  ?Result Value Ref Range  ? Glucose-Capillary 209 (H) 70 - 99 mg/dL  ?CBC     Status: Abnormal  ? Collection Time: 09/21/21  2:44 AM  ?Result Value Ref Range  ? WBC 12.5 (H) 4.0 - 10.5 K/uL  ? RBC 5.25 4.22 - 5.81 MIL/uL  ? Hemoglobin 14.3 13.0 - 17.0 g/dL  ? HCT 43.5 39.0 - 52.0 %  ? MCV 82.9 80.0 - 100.0 fL  ? MCH 27.2 26.0 - 34.0 pg  ? MCHC 32.9 30.0 - 36.0 g/dL  ? RDW 15.1  11.5 - 15.5 %  ? Platelets 127 (L) 150 - 400 K/uL  ? nRBC 0.0 0.0 - 0.2 %  ?Basic metabolic panel     Status: Abnormal  ? Collection Time: 09/21/21  2:44 AM  ?Result Value Ref Range  ? Sodium 148 (H) 135 - 145 mmol/L  ? Potassium 3.4 (L) 3.5 - 5.1 mmol/L  ? Chloride 113 (H) 98 - 111 mmol/L  ? CO2 25 22 - 32 mmol/L  ? Glucose, Bld 267 (H) 70 - 99 mg/dL  ? BUN 41 (H) 8 - 23 mg/dL  ? Creatinine, Ser 1.81 (H) 0.61 - 1.24 mg/dL  ? Calcium 8.6 (L) 8.9 - 10.3 mg/dL  ? GFR, Estimated 36 (L) >60 mL/min  ? Anion gap 10 5 - 15  ?Glucose, capillary     Status: Abnormal  ? Collection Time: 09/21/21  2:59 AM  ?Result Value Ref Range  ? Glucose-Capillary 293 (H) 70 - 99 mg/dL  ? ? ?Assessment & Plan: ?Present on Admission: ? Cervical spine fracture ([place]) ? ? ? LOS: 4 days  ? ?Additional comments:I reviewed the patient's new clinical lab test results. / ?Fall out of bed ? ?Sternal fx w/ mediastinal hemoatoma - Pain control. Cardiac monitoring ?L 2nd rib fx - no ptx on CT or CXR 4/4 ?C6 Fx w/ blood around cord- Per NSGY. Had L hemiplegia initially that has since resolved. MRI w/ subdural hematoma throughout the cervical region from the foramen magnum to the level of T1 effacing the subarachnoid space surrounding the cord. Eliquis reversed with adnexxa. NSGY recommending no surgical intervention and if patient were to become quadriplegic, comfort care. PT/OT ?C7 TP fx, C3-C5 SP fxs - Per NSGY. Multimodal pain control. continue c-collar ?T2 compression fx - per NSGY, non-op ?R VA Injury - vertebral artery occlusion at the level of C2 (common carotid, internal carotid and left vertebral arteries are patent). [id number] ?A. Fib on Eliquis - reversed. I d/w primary cardiologist, Dr. Ardis Hughs and recs for permanent discontinuation of Eliquis from a cardiac standpoint.  ?L temporal and R cerebellar infarcts - seen on CT 4/4. Neurology c/s, Dr. Cheral Marker, MRA completed and negative. Suspect stroke is 2/2 vert injury. [id number] started 4/4. Eliquis  will not be resumed.  ?DM2 - SSI ?HTN - Home meds + PRN meds.  ?HLD  ?CKD 3b - stable ?OSA on CPAP - CPAP at night ?CV - echo with EF 35-40% and wall motion ABN. I consulted Cardiology. ?Agitation - Soft restraints, D/C seroquel due to QT prolongation, wean Precedex as able, spiritus frumenti ?FEN - SLP eval, cortrak, IVF, bowel regimen, replete hypokalemia, check Mg and phos, lasix IV scheduled ?VTE - SCDs, hold anticoagulation and chemical prophylaxis till cleared by NSGY ?Dispo - ICU, PT/OT ?I spoke with two  of his daughters at the bedside. He is at significant risk of PNA. They do not want to proceed with intubation if resp status declines. I annotated his DNR order.  This was updated after discussion with the family today 09/21/21. ?Critical Care Total Time*:  ? ?Felicie Morn, MD ? ?Trauma & General Surgery ?Use [email or web address] to contact on call provider ? ?09/21/2021 ? ?*Care during the described time interval was provided by me. I have reviewed this patient's available data, including medical history, events of note, physical examination and test results as part of my evaluation. ? ?  ?

## 2021-09-21 NOTE — Progress Notes (Signed)
SLP Cancellation Note ? ?Patient Details ?Name: Anthony Ho ?MRN: [id number] ?DOB: 27-Jul-1931 ? ? ?Cancelled treatment:       Reason Eval/Treat Not Completed: Other (comment) (Pt was approached for speech-language-cognition evaluation with his daughters and RN present. Pt's RN, Cheri Rous, reported that pt has been very agitated this morning and that he is attempting to keep the pt calm for the pt's wife arrival at 61. It was recommended that the evaluation be deferred to reduce further agitation. It was agreed that SLP will be contacted if pt's status improves.) ? ?[name] I. Hardin Negus, [place], CCC-SLP ?Acute Rehabilitation Services ?Office number [phone] ?Pager [phone] ? ?[name] ?09/21/2021, 10:35 AM ?

## 2021-09-21 NOTE — Progress Notes (Signed)
Patient admitted to 6N-07 on comfort measures. Patient non-responsive at this time and appears to be uncomfortable with labored breathing. Patient repositioned with pillows. PRN albuterol administered for shortness of breath per orders. Continuous Pivot 1.5 Cal per NG tube infusing at 60 ml/hr. LR continuous running at 54m/hr. Ativan '1mg'$ /ml infusing at 0.5-2 mg/hr. Morphine infusing at '4mg'$ /hour per orders. Bolus pain medication administered per orders for comfort with positive effect. Family at bedside. ?

## 2021-09-21 NOTE — Progress Notes (Signed)
Subjective: ?The patient is confused and agitated.  His daughter is at the bedside.  She is his power of attorney.  She tells me that the family does not want him intubated. ?Objective: ?Vital signs in last 24 hours: ?Temp:  [97.4 ?F (36.3 ?C)-99.9 ?F (37.7 ?C)] 97.8 ?F (36.6 ?C) (04/07 0800) ?Pulse Rate:  [72-106] 95 (04/07 0800) ?Resp:  [15-25] 15 (04/06 2300) ?BP: (134-191)/(60-143) 173/91 (04/07 0845) ?SpO2:  [87 %-95 %] 88 % (04/07 0800) ?Estimated body mass index is 26.98 kg/m? as calculated from the following: ?  Height as of this encounter: '5\' 10"'$  (1.778 m). ?  Weight as of this encounter: 85.3 kg. ? ? ?Intake/Output from previous day: ?04/06 0701 - 04/07 0700 ?In: 2158 [I.V.:1612; NG/GT:546] ?Out: 2350 [Urine:2350] ?Intake/Output this shift: ?No intake/output data recorded. ? ?Physical exam the patient appears aphasic.  He is moving all 4 extremities well.  His pupils are equal. ? ?Lab Results: ?Recent Labs  ?  09/20/21 ?0210 09/21/21 ?9480  ?WBC 14.0* 12.5*  ?HGB 13.6 14.3  ?HCT 40.0 43.5  ?PLT 106* 127*  ? ?BMET ?Recent Labs  ?  09/20/21 ?0210 09/21/21 ?0244  ?NA 143 148*  ?K 3.0* 3.4*  ?CL 109 113*  ?CO2 26 25  ?GLUCOSE 133* 267*  ?BUN 30* 41*  ?CREATININE 1.93* 1.81*  ?CALCIUM 8.2* 8.6*  ? ? ?Studies/Results: ?CT HEAD WO CONTRAST (5MM) ? ?Result Date: 09/20/2021 ?CLINICAL DATA:  Altered mental status, status post fall. EXAM: CT HEAD WITHOUT CONTRAST TECHNIQUE: Contiguous axial images were obtained from the base of the skull through the vertex without intravenous contrast. RADIATION DOSE REDUCTION: This exam was performed according to the departmental dose-optimization program which includes automated exposure control, adjustment of the mA and/or kV according to patient size and/or use of iterative reconstruction technique. COMPARISON:  September 18, 2021. FINDINGS: Brain: Large low density area is seen involving the left parietal cortex concerning for acute infarction. Increased low density area is noted in  right occipital region concerning for acute infarction. Stable low-density area in right cerebellar hemisphere concerning for acute infarction. No definite hemorrhage is noted. No midline shift is noted. Ventricular size is within normal limits. Vascular: No hyperdense vessel or unexpected calcification. Skull: Normal. Negative for fracture or focal lesion. Sinuses/Orbits: No acute finding. Other: None. IMPRESSION: Continued presence of cortical low densities involving the left parietal cortex, right occipital lobe and right cerebellar hemisphere consistent with acute infarctions. No definite hemorrhage is noted. Electronically Signed   By: Marijo Conception M.D.   On: 09/20/2021 12:51  ? ?DG Abd Portable 1V ? ?Result Date: 09/19/2021 ?CLINICAL DATA:  Feeding tube placement EXAM: PORTABLE ABDOMEN - 1 VIEW COMPARISON:  None. FINDINGS: Feeding tube tip projects over the expected area of the distal stomach. Visualized bowel gas pattern is normal. No radio-opaque calculi or other significant radiographic abnormality are seen. IMPRESSION: Feeding tube tip projects over the expected area of the distal stomach. Electronically Signed   By: Yetta Glassman M.D.   On: 09/19/2021 15:09  ? ?ECHOCARDIOGRAM COMPLETE ? ?Result Date: 09/19/2021 ?   ECHOCARDIOGRAM REPORT   Patient Name:   Anthony Ho Date of Exam: 09/19/2021 Medical Rec #:  [id number]    Height:       70.0 in Accession #:    [id number]   Weight:       188.1 lb Date of Birth:  [birth year]    BSA:          2.033 m? Patient  Age:    88 years     BP:           174/86 mmHg Patient Gender: M            HR:           70 bpm. Exam Location:  Inpatient Procedure: 2D Echo, Cardiac Doppler, Color Doppler and Intracardiac            Opacification Agent Indications:    Stroke  History:        Patient has prior history of Echocardiogram examinations, most                 recent 08/26/2019. CHF, Arrythmias:Atrial Fibrillation; Risk                 Factors:Hypertension, Dyslipidemia,  Diabetes, Sleep Apnea and                 Current Smoker. CKD.  Sonographer:    Clayton Lefort RDCS (AE) Referring Phys: [id number] ASHISH ARORA IMPRESSIONS  1. No left ventricular thrombus is seen (Definity was used). Wall motion abnormalities are consistent with infarction in the distribution of the mid-LAD artery, but there may also be involvement in the PDA territory. Left ventricular ejection fraction, by estimation, is 35 to 40%. The left ventricle has moderately decreased function. The left ventricle has no regional wall motion abnormalities. There is mild concentric left ventricular hypertrophy. Left ventricular diastolic function could not be evaluated.  2. Right ventricular systolic function is normal. The right ventricular size is normal. There is moderately elevated pulmonary artery systolic pressure.  3. Left atrial size was severely dilated.  4. Right atrial size was severely dilated.  5. The mitral valve is normal in structure. Mild mitral valve regurgitation. Moderate mitral annular calcification.  6. Tricuspid valve regurgitation is moderate.  7. The aortic valve is tricuspid. There is mild calcification of the aortic valve. There is mild thickening of the aortic valve. Aortic valve regurgitation is trivial. Aortic valve sclerosis/calcification is present, without any evidence of aortic stenosis.  8. There is mild dilatation of the ascending aorta, measuring 41 mm.  9. The inferior vena cava is dilated in size with <50% respiratory variability, suggesting right atrial pressure of 15 mmHg. Comparison(s): The left ventricular function is significantly worse. The left ventricular wall motion abnormalities are new. FINDINGS  Left Ventricle: No left ventricular thrombus is seen (Definity was used). Wall motion abnormalities are consistent with infarction in the distribution of the mid-LAD artery, but there may also be involvement in the PDA territory. Left ventricular ejection fraction, by estimation, is 35 to  40%. The left ventricle has moderately decreased function. The left ventricle has no regional wall motion abnormalities. Definity contrast agent was given IV to delineate the left ventricular endocardial borders. The left ventricular internal cavity size was normal in size. There is mild concentric left ventricular hypertrophy. Left ventricular diastolic function could not be evaluated due to atrial fibrillation. Left ventricular diastolic function could  not be evaluated.  LV Wall Scoring: The apical anterior segment and apex are akinetic. The mid and distal anterior septum, mid and distal inferior wall, and mid inferoseptal segment are hypokinetic. Right Ventricle: The right ventricular size is normal. No increase in right ventricular wall thickness. Right ventricular systolic function is normal. There is moderately elevated pulmonary artery systolic pressure. The tricuspid regurgitant velocity is 3.17 m/s, and with an assumed right atrial pressure of 15 mmHg, the estimated right ventricular systolic pressure  is 55.2 mmHg. Left Atrium: Left atrial size was severely dilated. Right Atrium: Right atrial size was severely dilated. Pericardium: There is no evidence of pericardial effusion. Mitral Valve: The mitral valve is normal in structure. Moderate mitral annular calcification. Mild mitral valve regurgitation. Tricuspid Valve: The tricuspid valve is normal in structure. Tricuspid valve regurgitation is moderate. Aortic Valve: The aortic valve is tricuspid. There is mild calcification of the aortic valve. There is mild thickening of the aortic valve. Aortic valve regurgitation is trivial. Aortic regurgitation PHT measures 447 msec. Aortic valve sclerosis/calcification is present, without any evidence of aortic stenosis. Aortic valve mean gradient measures 2.0 mmHg. Aortic valve peak gradient measures 5.6 mmHg. Aortic valve area, by VTI measures 2.49 cm?. Pulmonic Valve: The pulmonic valve was grossly normal.  Pulmonic valve regurgitation is trivial. Aorta: The aortic root is normal in size and structure. There is mild dilatation of the ascending aorta, measuring 41 mm. Venous: The inferior vena cava is dilated in size with

## 2021-10-15 NOTE — Progress Notes (Signed)
Family leaving at this time. All belongings removed by the family. Unable to get name of funeral home at this time. ?

## 2021-10-15 NOTE — Progress Notes (Signed)
Family still at bedside at this time, ?

## 2021-10-15 NOTE — Accreditation Note (Signed)
Restraints reported to CMS  ?Pursuant to regulation 482.13 (G) (3) use of restraints was logged and CMS was notified via electronic portal on 09/26/2021 at 1130 ?

## 2021-10-15 NOTE — Progress Notes (Signed)
Pt expired at 0219; Verified by Tiney Rouge, RN and Rande Brunt RN. Wasted approximately 36m of ativan drip and 229mof morphine into the csrx with SaBedelia PersonFamily at bedside at this time. ?

## 2021-10-15 NOTE — Progress Notes (Signed)
Body taken to morgue by staff. ?

## 2021-10-15 DEATH — deceased

## 2021-11-15 NOTE — Death Summary Note (Signed)
?  DEATH SUMMARY  ? ?Patient Details  ?Name: Anthony Ho ?MRN: [id number] ?DOB: [birth year] ? ?Admission/Discharge Information  ? ?Admit Date:  Oct 11, 2021  ?Date of Death: Date of Death: 10-16-2021  ?Time of Death: Time of Death: 0219  ?Length of Stay: 5  ?Referring Physician: Lajean Manes, MD  ? ?Reason(s) for Hospitalization  ?  and Fall ? ?Diagnoses  ?Preliminary cause of death:  ?Secondary Diagnoses (including complications and co-morbidities):  ?Principal Problem: ?  Cervical spine fracture ([place]) ? ? ?Brief Hospital Course (including significant findings, care, treatment, and services provided and events leading to death)  ?Anthony Ho is a 88 y.o. year old male who fell from standing height. He was found to have cervical spine fractures, EDH, rib fractures. He was also found to have evidence of CVA. Cardiology, Neurosurgery, PT, Speech path were consulted. He was addmitted to the ICU. Palliative care was consulted after he was determined not to be improved after multiple days and was not a surgical candidate. He was deescalated on certain medications and made DNR/DNI/do not escalate. He died later that day. ? ? ? ?Pertinent Labs and Studies  ?Significant Diagnostic Studies ?CT HEAD WO CONTRAST (5MM) ? ?Result Date: 09/20/2021 ?CLINICAL DATA:  Altered mental status, status post fall. EXAM: CT HEAD WITHOUT CONTRAST TECHNIQUE: Contiguous axial images were obtained from the base of the skull through the vertex without intravenous contrast. RADIATION DOSE REDUCTION: This exam was performed according to the departmental dose-optimization program which includes automated exposure control, adjustment of the mA and/or kV according to patient size and/or use of iterative reconstruction technique. COMPARISON:  September 18, 2021. FINDINGS: Brain: Large low density area is seen involving the left parietal cortex concerning for acute infarction. Increased low density area is noted in right occipital region concerning for acute  infarction. Stable low-density area in right cerebellar hemisphere concerning for acute infarction. No definite hemorrhage is noted. No midline shift is noted. Ventricular size is within normal limits. Vascular: No hyperdense vessel or unexpected calcification. Skull: Normal. Negative for fracture or focal lesion. Sinuses/Orbits: No acute finding. Other: None. IMPRESSION: Continued presence of cortical low densities involving the left parietal cortex, right occipital lobe and right cerebellar hemisphere consistent with acute infarctions. No definite hemorrhage is noted. Electronically Signed   By: Marijo Conception M.D.   On: 09/20/2021 12:51   ? ?Microbiology ?No results found for this or any previous visit (from the past 240 hour(s)). ? ?Lab ?Basic Metabolic Panel: ?No results for input(s): NA, K, CL, CO2, GLUCOSE, BUN, CREATININE, CALCIUM, MG, PHOS in the last 168 hours. ?Liver Function Tests: ?No results for input(s): AST, ALT, ALKPHOS, BILITOT, PROT, ALBUMIN in the last 168 hours. ?No results for input(s): LIPASE, AMYLASE in the last 168 hours. ?No results for input(s): AMMONIA in the last 168 hours. ?CBC: ?No results for input(s): WBC, NEUTROABS, HGB, HCT, MCV, PLT in the last 168 hours. ?Cardiac Enzymes: ?No results for input(s): CKTOTAL, CKMB, CKMBINDEX, TROPONINI in the last 168 hours. ?Sepsis Labs: ?No results for input(s): PROCALCITON, WBC, LATICACIDVEN in the last 168 hours. ? ?Procedures/Operations  ?none ? ? ?Arta Bruce Vasil Juhasz ?10/20/2021, 9:55 AM ? ? ?

## 2022-05-07 IMAGING — CT CT HEAD W/O CM
3 series · 14 of 47 positions shown, 16 images · non-contrast
Comparison: None.

CLINICAL DATA: Status post fall

EXAM:
CT HEAD WITHOUT CONTRAST
CT CERVICAL SPINE WITHOUT CONTRAST
TECHNIQUE: Multidetector CT imaging of the head and cervical spine was
performed following the standard protocol without intravenous
contrast. Multiplanar CT image reconstructions of the cervical spine
were also generated.

[Series 2: head wo · axial · 0.44mm/px · z∈[-132,-7]mm · 8 of 30 slices shown, 10 images]
[im 3/30  brain]
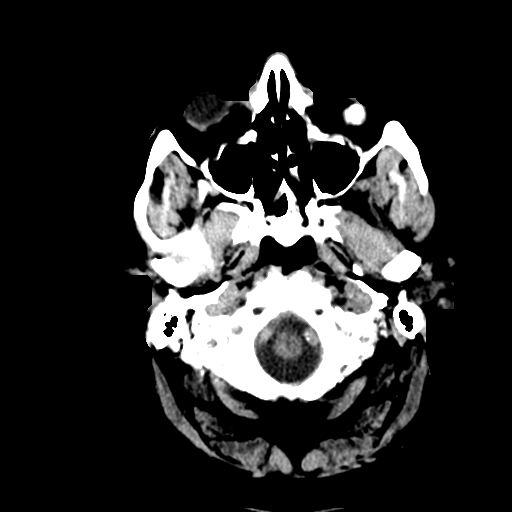
[im 3/30  bone]
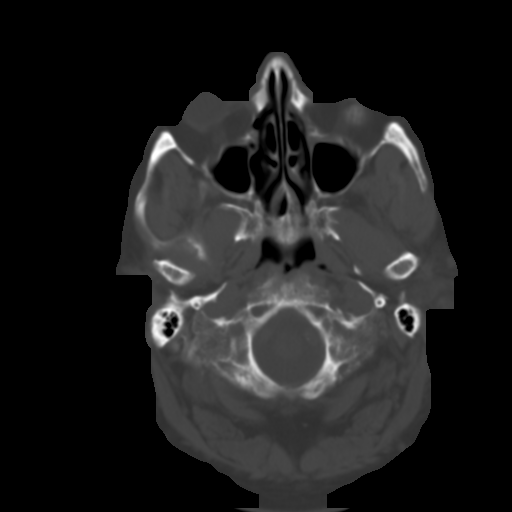
[im 7/30  brain]
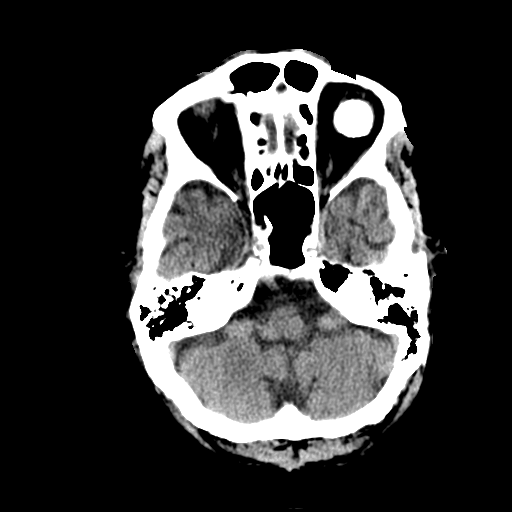
[im 10/30  brain]
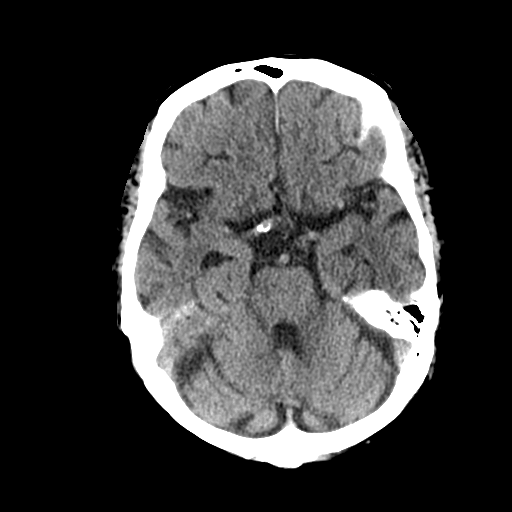
[im 14/30  brain]
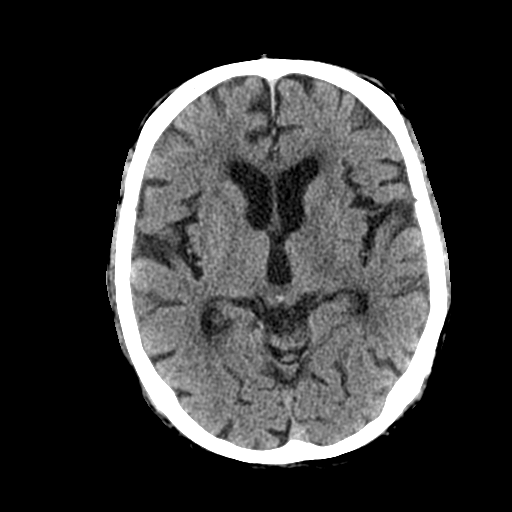
[im 17/30  brain]
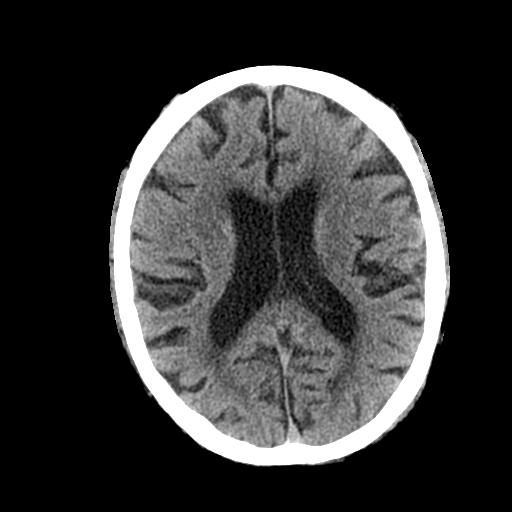
[im 17/30  bone]
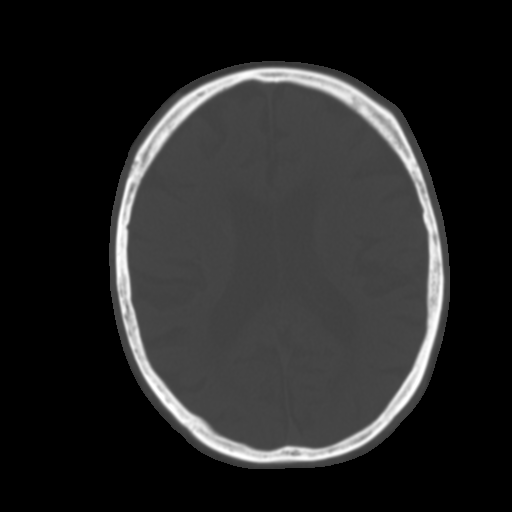
[im 21/30  brain]
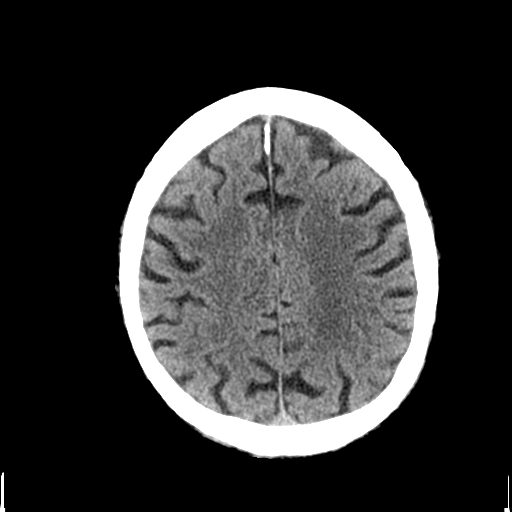
[im 24/30  brain]
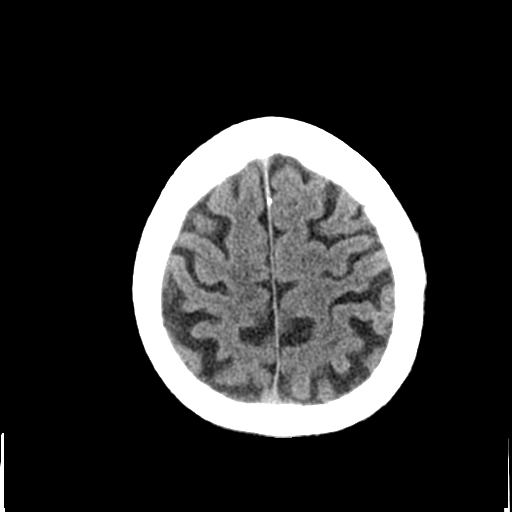
[im 28/30  brain]
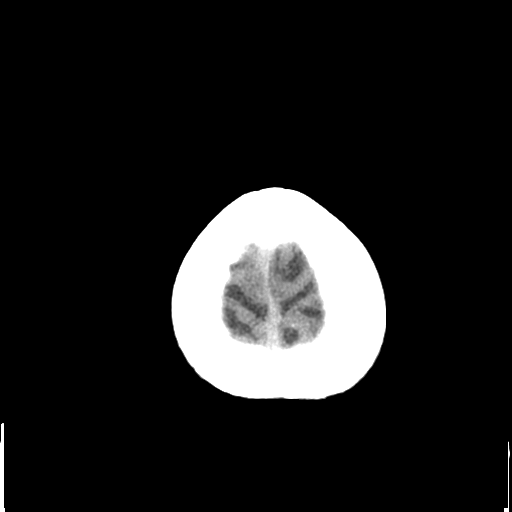

[Series 5: coronal soft tissue · coronal · 0.33mm/px · 3 of 80 slices shown]
[im 27/80  brain]
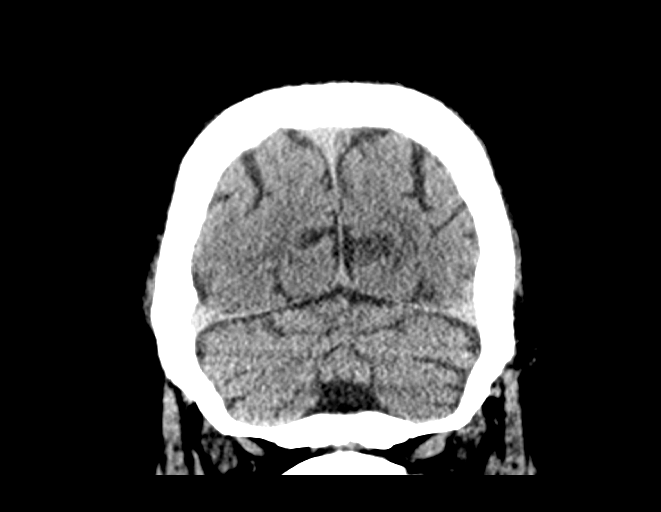
[im 36/80  brain]
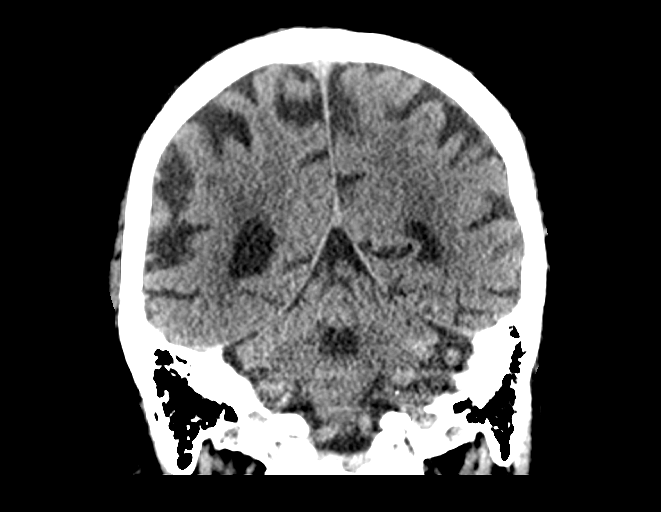
[im 44/80  brain]
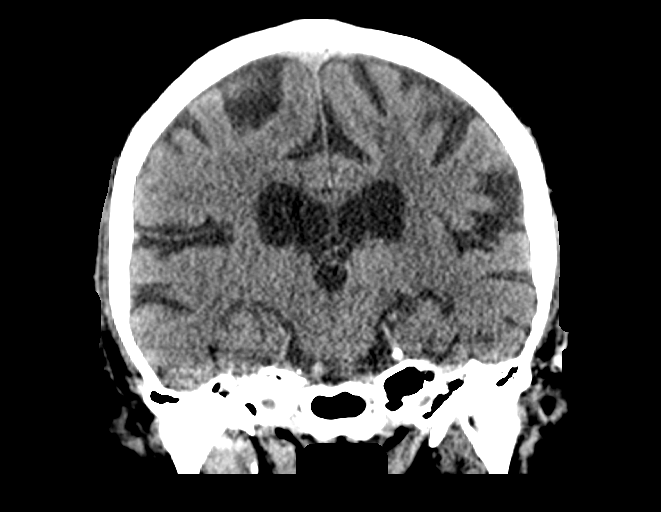

[Series 6: sagittal soft tissue · sagittal · 0.35mm/px · 3 of 73 slices shown]
[im 25/73  brain]
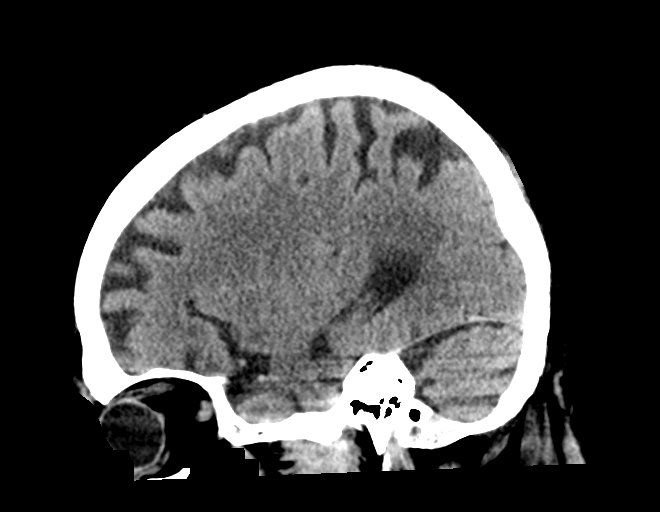
[im 37/73  brain]
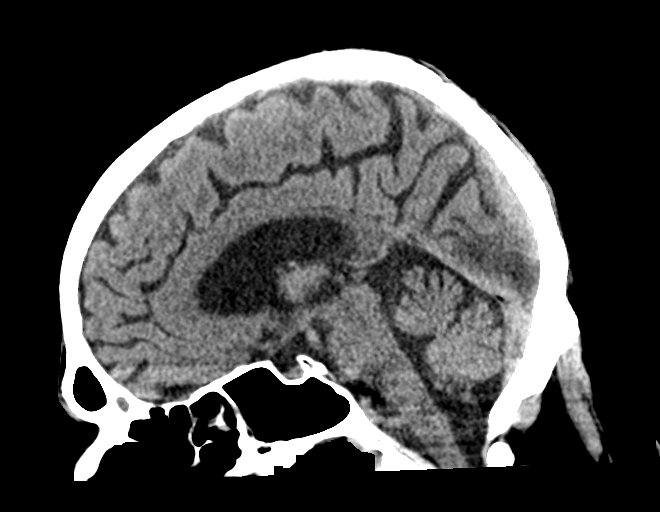
[im 49/73  brain]
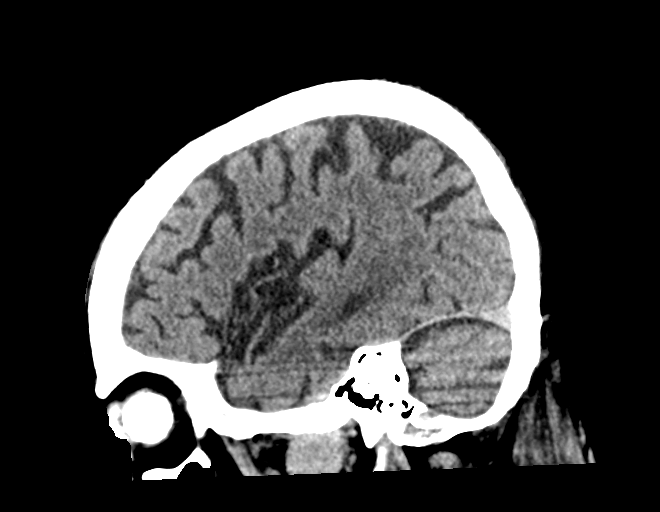

[14 of 47 positions shown; findings below may reference images not displayed]

FINDINGS: CT HEAD FINDINGS

BRAIN:
BRAIN
Cerebral ventricle sizes are concordant with the degree of cerebral
volume loss. Patchy and confluent areas of decreased attenuation are
noted throughout the deep and periventricular white matter of the
cerebral hemispheres bilaterally, compatible with chronic
microvascular ischemic disease.

No evidence of large-territorial acute infarction. No parenchymal
hemorrhage. No mass lesion. No extra-axial collection.

No mass effect or midline shift. No hydrocephalus. Basilar cisterns
are patent.

Vascular: No hyperdense vessel.

Skull: No acute fracture or focal lesion.

Sinuses/Orbits: Paranasal sinuses and mastoid air cells are clear.
Left orbit prosthesis. Right lens replacement. Otherwise the right
orbit unremarkable.

Other: Trace left frontal scalp edema.  No large hematoma formation.

CT CERVICAL SPINE FINDINGS

Alignment: Grade 1 anterolisthesis of C3 on C4.

Skull base and vertebrae: Multilevel degenerative changes of the
spine with no severe osseous neural foraminal or central canal
stenosis . No acute fracture. No aggressive appearing focal osseous
lesion or focal pathologic process.

Soft tissues and spinal canal: No prevertebral fluid or swelling. No
visible canal hematoma.

Upper chest: Biapical pleural/pulmonary scarring.

Other: Carotid artery calcifications.
IMPRESSION: 1. No acute intracranial abnormality.
2. No acute displaced fracture or traumatic listhesis of the
cervical spine.

## 2022-05-07 IMAGING — CT CT CERVICAL SPINE W/O CM
3 of 4 series · 12 of 33 positions shown, 14 images · non-contrast
Comparison: None.

CLINICAL DATA: Status post fall

EXAM:
CT HEAD WITHOUT CONTRAST
CT CERVICAL SPINE WITHOUT CONTRAST
TECHNIQUE: Multidetector CT imaging of the head and cervical spine was
performed following the standard protocol without intravenous
contrast. Multiplanar CT image reconstructions of the cervical spine
were also generated.

[Series 4: orthogonal bone · axial · 0.27mm/px · z∈[-308,-184]mm · 4 of 103 slices shown, 5 images]
[im 18/103  soft-tissue]
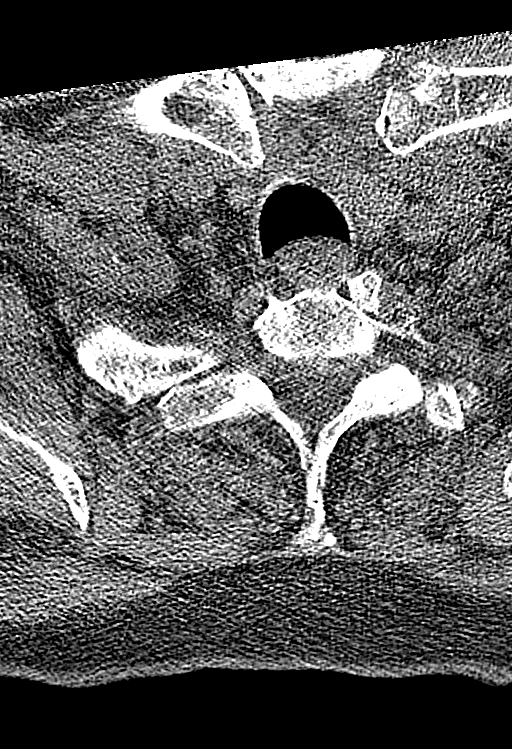
[im 18/103  bone]
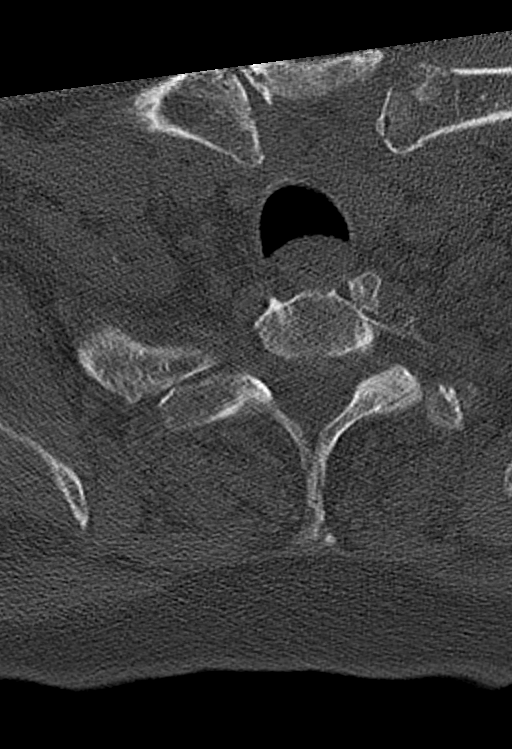
[im 35/103  bone]
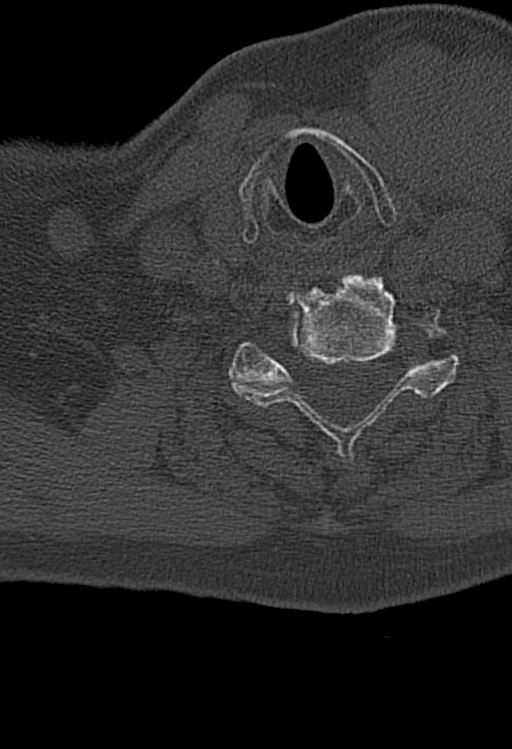
[im 69/103  bone]
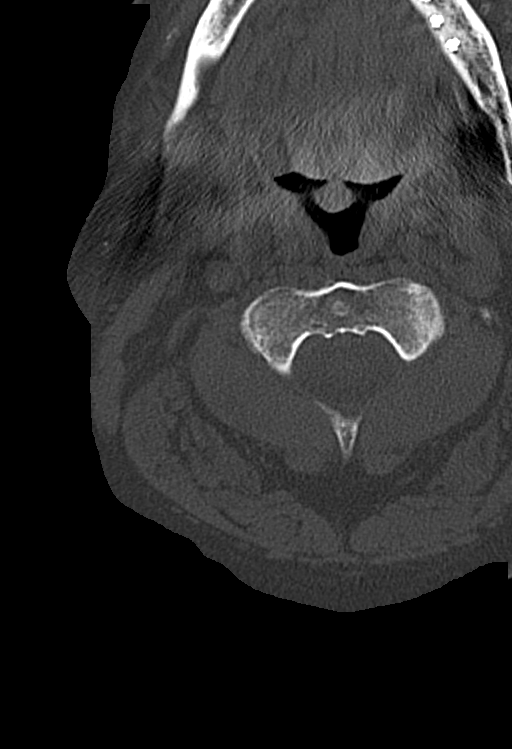
[im 86/103  bone]
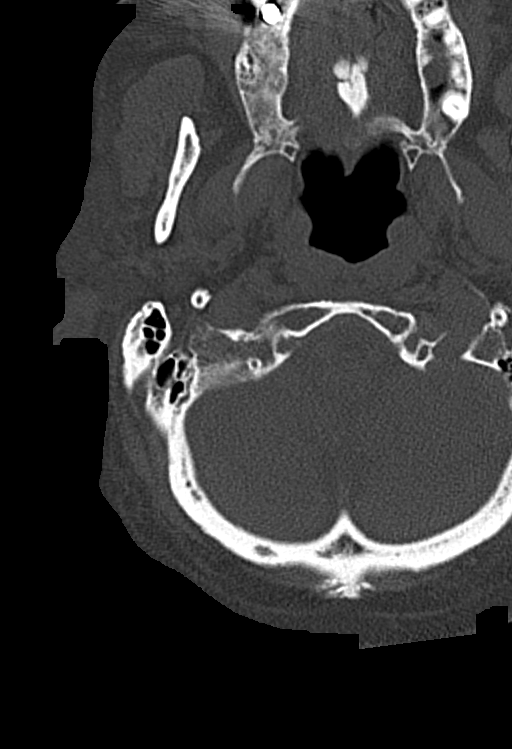

[Series 5: coronal bone · coronal · 0.31mm/px · 3 of 81 slices shown]
[im 17/81  bone]
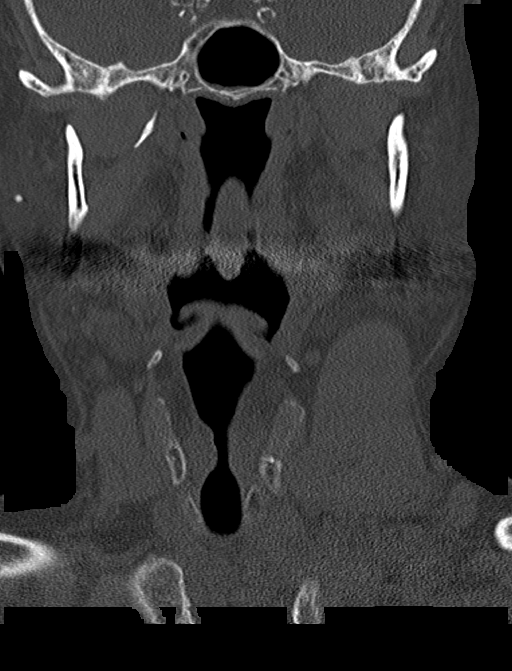
[im 33/81  bone]
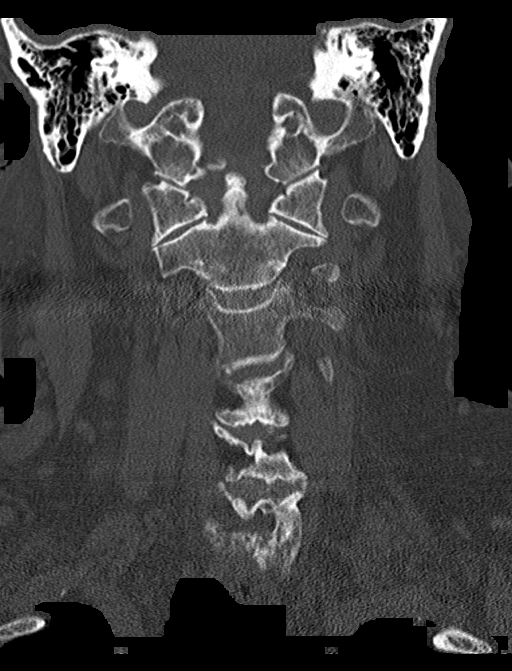
[im 49/81  bone]
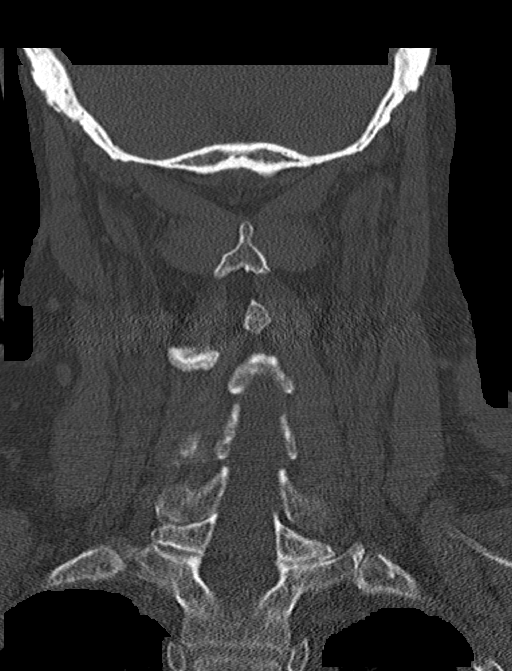

[Series 6: sagittal bone · sagittal · 0.33mm/px · 5 of 61 slices shown, 6 images]
[im 21/61  bone]
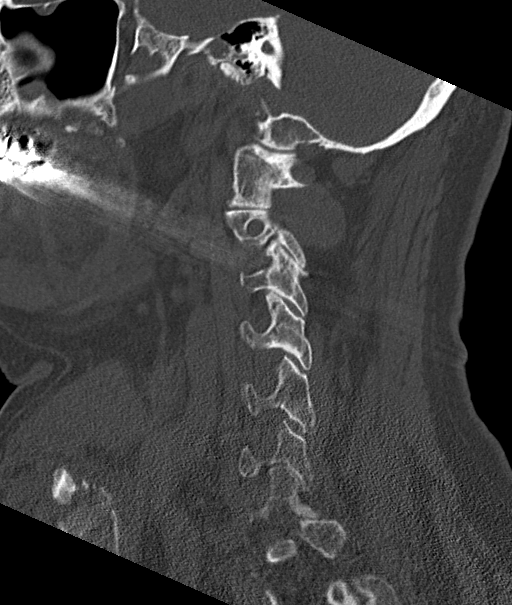
[im 26/61  bone]
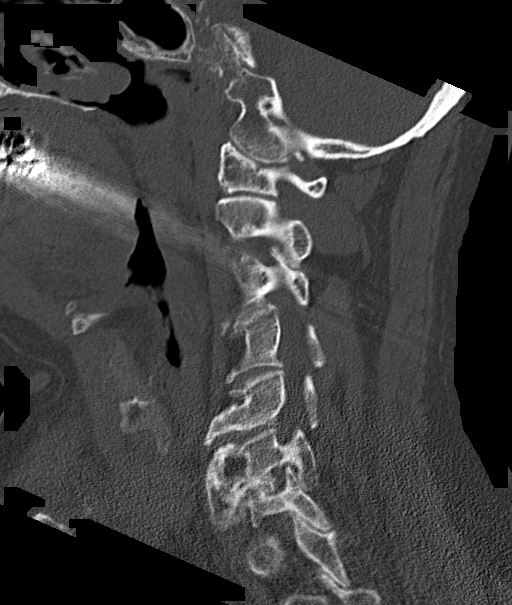
[im 31/61  soft-tissue]
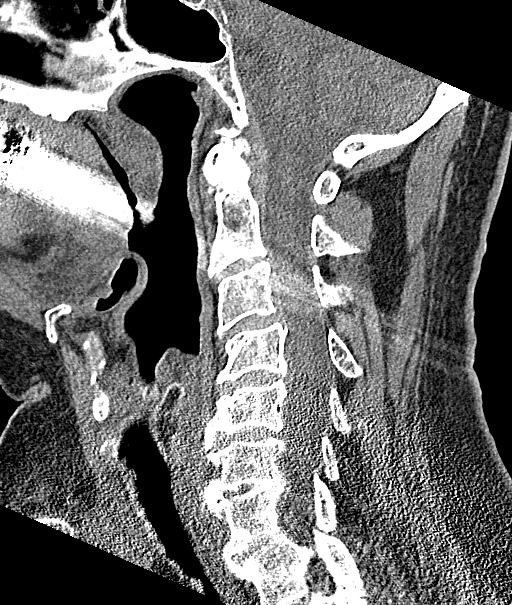
[im 31/61  bone]
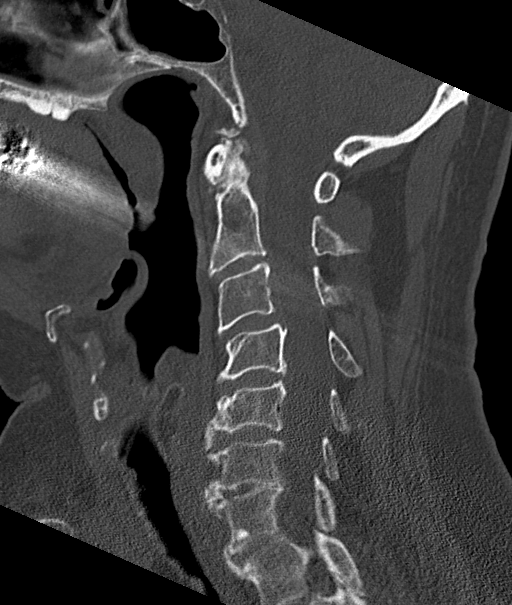
[im 36/61  bone]
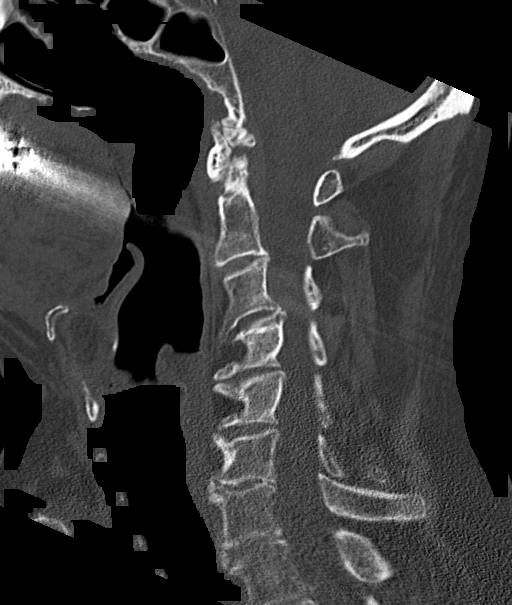
[im 41/61  bone]
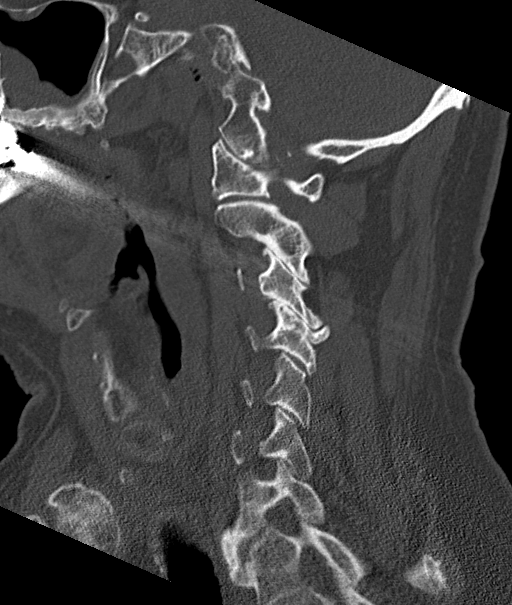

[12 of 33 positions shown; findings below may reference images not displayed]

FINDINGS: CT HEAD FINDINGS

BRAIN:
BRAIN
Cerebral ventricle sizes are concordant with the degree of cerebral
volume loss. Patchy and confluent areas of decreased attenuation are
noted throughout the deep and periventricular white matter of the
cerebral hemispheres bilaterally, compatible with chronic
microvascular ischemic disease.

No evidence of large-territorial acute infarction. No parenchymal
hemorrhage. No mass lesion. No extra-axial collection.

No mass effect or midline shift. No hydrocephalus. Basilar cisterns
are patent.

Vascular: No hyperdense vessel.

Skull: No acute fracture or focal lesion.

Sinuses/Orbits: Paranasal sinuses and mastoid air cells are clear.
Left orbit prosthesis. Right lens replacement. Otherwise the right
orbit unremarkable.

Other: Trace left frontal scalp edema.  No large hematoma formation.

CT CERVICAL SPINE FINDINGS

Alignment: Grade 1 anterolisthesis of C3 on C4.

Skull base and vertebrae: Multilevel degenerative changes of the
spine with no severe osseous neural foraminal or central canal
stenosis . No acute fracture. No aggressive appearing focal osseous
lesion or focal pathologic process.

Soft tissues and spinal canal: No prevertebral fluid or swelling. No
visible canal hematoma.

Upper chest: Biapical pleural/pulmonary scarring.

Other: Carotid artery calcifications.
IMPRESSION: 1. No acute intracranial abnormality.
2. No acute displaced fracture or traumatic listhesis of the
cervical spine.

## 2022-07-10 ENCOUNTER — Ambulatory Visit: Payer: Medicare Other | Admitting: Adult Health

## 2022-07-15 ENCOUNTER — Ambulatory Visit: Payer: Medicare Other | Admitting: Adult Health
